# Patient Record
Sex: Female | Born: 1955 | Race: Black or African American | Hispanic: No | Marital: Single
Health system: Southern US, Academic
[De-identification: ages and names within clinical notes are randomized; demographics above are authoritative.]

## PROBLEM LIST (undated history)

## (undated) ENCOUNTER — Encounter

## (undated) ENCOUNTER — Encounter: Attending: Family | Primary: Family

## (undated) ENCOUNTER — Ambulatory Visit

## (undated) ENCOUNTER — Telehealth

## (undated) ENCOUNTER — Encounter: Payer: MEDICARE | Attending: Family | Primary: Family

## (undated) ENCOUNTER — Ambulatory Visit: Payer: MEDICARE | Attending: Family | Primary: Family

## (undated) ENCOUNTER — Ambulatory Visit: Payer: MEDICARE | Attending: Obstetrics & Gynecology | Primary: Obstetrics & Gynecology

## (undated) ENCOUNTER — Telehealth: Attending: Family | Primary: Family

## (undated) ENCOUNTER — Encounter
Attending: Pharmacist Clinician (PhC)/ Clinical Pharmacy Specialist | Primary: Pharmacist Clinician (PhC)/ Clinical Pharmacy Specialist

## (undated) ENCOUNTER — Inpatient Hospital Stay

## (undated) ENCOUNTER — Ambulatory Visit: Payer: MEDICARE

## (undated) ENCOUNTER — Ambulatory Visit: Attending: Emergency Medicine | Primary: Emergency Medicine

## (undated) ENCOUNTER — Encounter: Payer: MEDICARE | Attending: "Endocrinology | Primary: "Endocrinology

## (undated) ENCOUNTER — Ambulatory Visit
Payer: MEDICARE | Attending: Student in an Organized Health Care Education/Training Program | Primary: Student in an Organized Health Care Education/Training Program

## (undated) ENCOUNTER — Telehealth: Attending: Women's Health | Primary: Women's Health

## (undated) ENCOUNTER — Encounter: Attending: Gastroenterology | Primary: Gastroenterology

## (undated) ENCOUNTER — Ambulatory Visit: Attending: Family | Primary: Family

## (undated) ENCOUNTER — Ambulatory Visit: Payer: MEDICARE | Attending: Women's Health | Primary: Women's Health

## (undated) DIAGNOSIS — R4182 Altered mental status, unspecified: Secondary | ICD-10-CM

## (undated) DIAGNOSIS — F319 Bipolar disorder, unspecified: Secondary | ICD-10-CM

## (undated) DIAGNOSIS — J449 Chronic obstructive pulmonary disease, unspecified: Secondary | ICD-10-CM

## (undated) DIAGNOSIS — E785 Hyperlipidemia, unspecified: Secondary | ICD-10-CM

## (undated) DIAGNOSIS — N189 Chronic kidney disease, unspecified: Secondary | ICD-10-CM

## (undated) DIAGNOSIS — B1920 Unspecified viral hepatitis C without hepatic coma: Secondary | ICD-10-CM

## (undated) DIAGNOSIS — E875 Hyperkalemia: Secondary | ICD-10-CM

## (undated) DIAGNOSIS — I10 Essential (primary) hypertension: Secondary | ICD-10-CM

## (undated) DIAGNOSIS — E119 Type 2 diabetes mellitus without complications: Secondary | ICD-10-CM

## (undated) DIAGNOSIS — T887XXA Unspecified adverse effect of drug or medicament, initial encounter: Secondary | ICD-10-CM

## (undated) HISTORY — PX: OTHER SURGICAL HISTORY: SHX169

## (undated) MED ORDER — LACTULOSE 20 GRAM ORAL PACKET: Freq: Three times a day (TID) | ORAL | 0.00000 days

## (undated) MED ORDER — TIZANIDINE 4 MG TABLET: 0.00000 days

## (undated) MED ORDER — OLOPATADINE 0.1 % EYE DROPS: OPHTHALMIC | 0.00000 days

---
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
## 2013-03-01 ENCOUNTER — Emergency Department (HOSPITAL_COMMUNITY)
Admission: EM | Admit: 2013-03-01 | Discharge: 2013-03-01 | Disposition: A | Discharge status: Home / Self Care | Payer: Medicare Other | Attending: Emergency Medicine | Admitting: Emergency Medicine

## 2013-03-01 ENCOUNTER — Emergency Department (HOSPITAL_COMMUNITY): Payer: Medicare Other

## 2013-03-01 ENCOUNTER — Encounter (HOSPITAL_COMMUNITY): Payer: Self-pay | Admitting: Emergency Medicine

## 2013-03-01 DIAGNOSIS — R4789 Other speech disturbances: Secondary | ICD-10-CM | POA: Insufficient documentation

## 2013-03-01 DIAGNOSIS — F29 Unspecified psychosis not due to a substance or known physiological condition: Secondary | ICD-10-CM | POA: Insufficient documentation

## 2013-03-01 DIAGNOSIS — N189 Chronic kidney disease, unspecified: Secondary | ICD-10-CM | POA: Insufficient documentation

## 2013-03-01 DIAGNOSIS — J449 Chronic obstructive pulmonary disease, unspecified: Secondary | ICD-10-CM | POA: Insufficient documentation

## 2013-03-01 DIAGNOSIS — R410 Disorientation, unspecified: Secondary | ICD-10-CM

## 2013-03-01 DIAGNOSIS — J4489 Other specified chronic obstructive pulmonary disease: Secondary | ICD-10-CM | POA: Insufficient documentation

## 2013-03-01 DIAGNOSIS — E119 Type 2 diabetes mellitus without complications: Secondary | ICD-10-CM | POA: Insufficient documentation

## 2013-03-01 DIAGNOSIS — F319 Bipolar disorder, unspecified: Secondary | ICD-10-CM | POA: Insufficient documentation

## 2013-03-01 DIAGNOSIS — IMO0002 Reserved for concepts with insufficient information to code with codable children: Secondary | ICD-10-CM | POA: Insufficient documentation

## 2013-03-01 DIAGNOSIS — Z8619 Personal history of other infectious and parasitic diseases: Secondary | ICD-10-CM | POA: Insufficient documentation

## 2013-03-01 DIAGNOSIS — Z794 Long term (current) use of insulin: Secondary | ICD-10-CM | POA: Insufficient documentation

## 2013-03-01 DIAGNOSIS — Z79899 Other long term (current) drug therapy: Secondary | ICD-10-CM | POA: Insufficient documentation

## 2013-03-01 DIAGNOSIS — I129 Hypertensive chronic kidney disease with stage 1 through stage 4 chronic kidney disease, or unspecified chronic kidney disease: Secondary | ICD-10-CM | POA: Insufficient documentation

## 2013-03-01 HISTORY — DX: Type 2 diabetes mellitus without complications: E11.9

## 2013-03-01 HISTORY — DX: Altered mental status, unspecified: R41.82

## 2013-03-01 HISTORY — DX: Bipolar disorder, unspecified: F31.9

## 2013-03-01 HISTORY — DX: Hyperlipidemia, unspecified: E78.5

## 2013-03-01 HISTORY — DX: Unspecified viral hepatitis C without hepatic coma: B19.20

## 2013-03-01 HISTORY — DX: Chronic obstructive pulmonary disease, unspecified: J44.9

## 2013-03-01 HISTORY — DX: Essential (primary) hypertension: I10

## 2013-03-01 LAB — URINALYSIS, ROUTINE W REFLEX MICROSCOPIC
Bilirubin Urine: NEGATIVE
Glucose, UA: NEGATIVE mg/dL
Hgb urine dipstick: NEGATIVE
Ketones, ur: NEGATIVE mg/dL
Leukocytes, UA: NEGATIVE
Nitrite: NEGATIVE
Protein, ur: NEGATIVE mg/dL
SPECIFIC GRAVITY, URINE: 1.015 (ref 1.005–1.030)
UROBILINOGEN UA: 0.2 mg/dL (ref 0.0–1.0)
pH: 7 (ref 5.0–8.0)

## 2013-03-01 LAB — CBC WITH DIFFERENTIAL/PLATELET
BASOS ABS: 0.1 10*3/uL (ref 0.0–0.1)
Basophils Relative: 1 % (ref 0–1)
EOS ABS: 0.1 10*3/uL (ref 0.0–0.7)
Eosinophils Relative: 2 % (ref 0–5)
HCT: 33.5 % — ABNORMAL LOW (ref 36.0–46.0)
Hemoglobin: 11.1 g/dL — ABNORMAL LOW (ref 12.0–15.0)
LYMPHS ABS: 3 10*3/uL (ref 0.7–4.0)
Lymphocytes Relative: 46 % (ref 12–46)
MCH: 29.9 pg (ref 26.0–34.0)
MCHC: 33.1 g/dL (ref 30.0–36.0)
MCV: 90.3 fL (ref 78.0–100.0)
Monocytes Absolute: 1.1 10*3/uL — ABNORMAL HIGH (ref 0.1–1.0)
Monocytes Relative: 16 % — ABNORMAL HIGH (ref 3–12)
Neutro Abs: 2.3 10*3/uL (ref 1.7–7.7)
Neutrophils Relative %: 36 % — ABNORMAL LOW (ref 43–77)
PLATELETS: 156 10*3/uL (ref 150–400)
RBC: 3.71 MIL/uL — AB (ref 3.87–5.11)
RDW: 14.6 % (ref 11.5–15.5)
WBC: 6.6 10*3/uL (ref 4.0–10.5)

## 2013-03-01 LAB — PROTIME-INR
INR: 1.57 — ABNORMAL HIGH (ref 0.00–1.49)
PROTHROMBIN TIME: 18.3 s — AB (ref 11.6–15.2)

## 2013-03-01 LAB — COMPREHENSIVE METABOLIC PANEL
ALBUMIN: 2.8 g/dL — AB (ref 3.5–5.2)
ALT: 31 U/L (ref 0–35)
AST: 48 U/L — AB (ref 0–37)
Alkaline Phosphatase: 157 U/L — ABNORMAL HIGH (ref 39–117)
BUN: 12 mg/dL (ref 6–23)
CALCIUM: 9 mg/dL (ref 8.4–10.5)
CHLORIDE: 105 meq/L (ref 96–112)
CO2: 26 mEq/L (ref 19–32)
CREATININE: 1.19 mg/dL — AB (ref 0.50–1.10)
GFR calc Af Amer: 57 mL/min — ABNORMAL LOW (ref >90)
GFR calc non Af Amer: 49 mL/min — ABNORMAL LOW (ref >90)
Glucose, Bld: 107 mg/dL — ABNORMAL HIGH (ref 70–99)
Potassium: 4.1 mEq/L (ref 3.7–5.3)
Sodium: 140 mEq/L (ref 137–147)
TOTAL PROTEIN: 8.1 g/dL (ref 6.0–8.3)
Total Bilirubin: 0.9 mg/dL (ref 0.3–1.2)

## 2013-03-01 LAB — GLUCOSE, CAPILLARY: GLUCOSE-CAPILLARY: 105 mg/dL — AB (ref 70–99)

## 2013-03-01 LAB — AMMONIA: AMMONIA: 36 umol/L (ref 11–60)

## 2013-03-01 LAB — PRO B NATRIURETIC PEPTIDE: Pro B Natriuretic peptide (BNP): 92.3 pg/mL (ref 0–125)

## 2013-03-01 LAB — LACTIC ACID, PLASMA: Lactic Acid, Venous: 0.8 mmol/L (ref 0.5–2.2)

## 2013-03-01 MED ORDER — SODIUM CHLORIDE 0.9 % IV SOLN
1000.0000 mL | INTRAVENOUS | Status: DC
Start: 2013-03-01 — End: 2013-03-02

## 2013-03-01 MED ORDER — SODIUM CHLORIDE 0.9 % IV SOLN
1000.0000 mL | Freq: Once | INTRAVENOUS | Status: AC
  Administered 2013-03-01: 1000 mL via INTRAVENOUS

## 2013-03-01 NOTE — ED Notes (Signed)
[facility] called and given reports. Staff states they will send transportation.

## 2013-03-01 NOTE — ED Notes (Signed)
Pt comes [facility] via [facility] after staff reports increased altered mental status and increased falls x1 week. Staff also states "face and legs are more swollen than usual".

## 2013-03-01 NOTE — ED Notes (Signed)
[facility] and requested to speak with ER physician about pt's condition. Dr. Deretha EmoryZackowski spoke with the PA and re-assessed pt and agreed with Dr. Priscille LovelessLockwood's disposition to discharge the pt.

## 2013-03-01 NOTE — ED Provider Notes (Signed)
CSN: [id number]     Arrival date & time 03/01/13  1256 History   First MD Initiated Contact with Patient 03/01/13 1315     Chief Complaint  Patient presents with  . Altered Mental Status    HPI  Patient presents for a nursing facility with staff concerns of increasing confusion, frequency of falls. History of present illness is per the patient and her sister.  One end of it this concern that developed over some time, are not acutely new. The patient herself denies pain. She states that she feels hungry, but generally unwell. She denies new confusion, disorientation. She does endorse a history of multiple medical problems, including hepatitis C, kidney disease, COPD. She notes no new precipitating factors, no alleviating or exacerbating factors either.   Past Medical History  Diagnosis Date  . Altered mental status   . Hypertension   . Hyperlipidemia   . Diabetes mellitus without complication   . COPD (chronic obstructive pulmonary disease)   . Renal disorder     chronic kidney failure  . Hepatitis C   . Bipolar disorder    Past Surgical History  Procedure Laterality Date  . Unable     No family history on file. History  Substance Use Topics  . Smoking status: Unknown If Ever Smoked  . Smokeless tobacco: Not on file  . Alcohol Use: No   OB History   Grav Para Term Preterm Abortions TAB SAB Ect Mult Living                 Review of Systems  Constitutional:       Per HPI, otherwise negative  HENT:       Per HPI, otherwise negative  Respiratory:       Per HPI, otherwise negative  Cardiovascular:       Per HPI, otherwise negative  Gastrointestinal: Negative for vomiting.  Endocrine:       Negative aside from HPI  Genitourinary:       Neg aside from HPI   Musculoskeletal:       Per HPI, otherwise negative  Skin: Negative.   Neurological: Negative for syncope.    Allergies  Codeine  Home Medications   Current Outpatient Rx  Name  Route  Sig  Dispense   Refill  . acetaminophen (TYLENOL) 500 MG tablet   Oral   Take 500 mg by mouth every 4 (four) hours as needed for mild pain or fever.         Marland Kitchen albuterol (PROVENTIL) (2.5 MG/3ML) 0.083% nebulizer solution   Nebulization   Take 2.5 mg by nebulization every 6 (six) hours as needed for wheezing or shortness of breath.         Marland Kitchen alum & mag hydroxide-simeth (MAALOX/MYLANTA) 200-200-20 MG/5ML suspension   Oral   Take 30 mLs by mouth every 6 (six) hours as needed for indigestion or heartburn.         Marland Kitchen amLODipine (NORVASC) 10 MG tablet   Oral   Take 10 mg by mouth daily.         Marland Kitchen diltiazem (DILACOR XR) 180 MG 24 hr capsule   Oral   Take 180 mg by mouth daily.         . Fluticasone-Salmeterol (ADVAIR) 100-50 MCG/DOSE AEPB   Inhalation   Inhale 1 puff into the lungs 2 (two) times daily.         Marland Kitchen gabapentin (NEURONTIN) 100 MG capsule   Oral   Take  100 mg by mouth 3 (three) times daily.         Marland Kitchen glipiZIDE (GLUCOTROL XL) 10 MG 24 hr tablet   Oral   Take 10 mg by mouth daily with breakfast.         . hydrochlorothiazide (HYDRODIURIL) 25 MG tablet   Oral   Take 25 mg by mouth daily.         . hydrOXYzine (ATARAX/VISTARIL) 25 MG tablet   Oral   Take 25 mg by mouth every 6 (six) hours as needed for itching.         . insulin detemir (LEVEMIR) 100 UNIT/ML injection   Subcutaneous   Inject 40 Units into the skin at bedtime.         . insulin lispro (HUMALOG) 100 UNIT/ML injection   Subcutaneous   Inject 18 Units into the skin 3 (three) times daily. Give right after meals.  Hold if patient eats less than 1/2 of meal and if FSBS is less than 100.         . lactulose (CHRONULAC) 10 GM/15ML solution   Oral   Take 20 g by mouth 2 (two) times daily.         Marland Kitchen lisinopril (PRINIVIL,ZESTRIL) 40 MG tablet   Oral   Take 40 mg by mouth daily.         Marland Kitchen loperamide (IMODIUM) 2 MG capsule   Oral   Take 2 mg by mouth as needed for diarrhea or loose stools.          . magnesium hydroxide (MILK OF MAGNESIA) 400 MG/5ML suspension   Oral   Take 30 mLs by mouth at bedtime as needed for mild constipation.         . methadone (DOLOPHINE) 5 MG tablet   Oral   Take 2.5 mg by mouth every 12 (twelve) hours.         Marland Kitchen oxyCODONE-acetaminophen (PERCOCET/ROXICET) 5-325 MG per tablet   Oral   Take 1 tablet by mouth every 4 (four) hours as needed for severe pain.         . potassium chloride SA (K-DUR,KLOR-CON) 20 MEQ tablet   Oral   Take 20 mEq by mouth 2 (two) times daily.         . pregabalin (LYRICA) 100 MG capsule   Oral   Take 100 mg by mouth 2 (two) times daily.         . pregabalin (LYRICA) 150 MG capsule   Oral   Take 150 mg by mouth at bedtime.         . psyllium (METAMUCIL) 58.6 % powder   Oral   Take 1 packet by mouth daily.         . QUEtiapine Fumarate (SEROQUEL XR) 150 MG 24 hr tablet   Oral   Take 300 mg by mouth at bedtime.         . senna (SENOKOT) 8.6 MG TABS tablet   Oral   Take 2 tablets by mouth daily.         . Skin Protectants, Misc. (EUCERIN) cream   Topical   Apply 1 application topically 2 (two) times daily as needed for dry skin.         Marland Kitchen traMADol (ULTRAM) 50 MG tablet   Oral   Take 50 mg by mouth every 6 (six) hours as needed for moderate pain.         . Vitamin D, Ergocalciferol, (DRISDOL) 50000 UNITS  CAPS capsule   Oral   Take 50,000 Units by mouth every 7 (seven) days.          BP 122/77  Pulse 100  Temp(Src) 98.8 F (37.1 C) (Oral)  Resp 18  Ht 5\' 7"  (1.702 m)  Wt 200 lb (90.719 kg)  BMI 31.32 kg/m2  SpO2 93% Physical Exam  Nursing note and vitals reviewed. Constitutional: She is oriented to person, place, and time. She appears well-developed and well-nourished. No distress.  HENT:  Head: Normocephalic and atraumatic.  Eyes: Conjunctivae and EOM are normal.  Cardiovascular: Normal rate and regular rhythm.   Pulmonary/Chest: Effort normal and breath sounds normal. No  stridor. No respiratory distress.  Abdominal: She exhibits no distension. There is no tenderness.  Musculoskeletal: She exhibits no edema.  L distal UE in cast - patient moves digits appropriately.  Neurological: She is alert and oriented to person, place, and time. No cranial nerve deficit.  No asterixis  Skin: Skin is warm and dry.  Psychiatric: She has a normal mood and affect. Her speech is delayed. She is slowed and withdrawn. Thought content is not paranoid and not delusional. Cognition and memory are impaired.    ED Course  Procedures (including critical care time) Labs Review Labs Reviewed  GLUCOSE, CAPILLARY - Abnormal; Notable for the following:    Glucose-Capillary 105 (*)    All other components within normal limits  COMPREHENSIVE METABOLIC PANEL - Abnormal; Notable for the following:    Glucose, Bld 107 (*)    Creatinine, Ser 1.19 (*)    Albumin 2.8 (*)    AST 48 (*)    Alkaline Phosphatase 157 (*)    GFR calc non Af Amer 49 (*)    GFR calc Af Amer 57 (*)    All other components within normal limits  PROTIME-INR - Abnormal; Notable for the following:    Prothrombin Time 18.3 (*)    INR 1.57 (*)    All other components within normal limits  CBC WITH DIFFERENTIAL - Abnormal; Notable for the following:    RBC 3.71 (*)    Hemoglobin 11.1 (*)    HCT 33.5 (*)    Neutrophils Relative % 36 (*)    Monocytes Relative 16 (*)    Monocytes Absolute 1.1 (*)    All other components within normal limits  LACTIC ACID, PLASMA  AMMONIA  URINALYSIS, ROUTINE W REFLEX MICROSCOPIC   Imaging Review Dg Chest 2 View  03/01/2013   CLINICAL DATA:  Altered mental status.  EXAM: CHEST  2 VIEW  COMPARISON:  None.  FINDINGS: The lungs are clear. Heart size is upper normal. No pneumothorax or pleural effusion. No focal bony abnormality.  IMPRESSION: No acute disease.   Electronically Signed   By: Drusilla Kannerhomas  Dalessio M.D.   On: 03/01/2013 14:49   Ct Head Wo Contrast  03/01/2013   CLINICAL  DATA:  Altered mental status. Increasing falls over the past week.  EXAM: CT HEAD WITHOUT CONTRAST  TECHNIQUE: Contiguous axial images were obtained from the base of the skull through the vertex without intravenous contrast.  COMPARISON:  No priors.  FINDINGS: Faint physiologic calcifications in the basal ganglia bilaterally. No acute intracranial abnormalities. Specifically, no evidence of acute intracranial hemorrhage, no definite findings of acute/subacute cerebral ischemia, no mass, mass effect, hydrocephalus or abnormal intra or extra-axial fluid collections. Visualized paranasal sinuses and mastoids are well pneumatized. No acute displaced skull fractures are identified.  IMPRESSION: 1. No acute intracranial abnormalities. 2. The  appearance of the brain is normal.   Electronically Signed   By: Trudie Reed M.D.   On: 03/01/2013 14:31    EKG Interpretation    Date/Time:  Wednesday March 01 2013 13:31:41 EST Ventricular Rate:  93 PR Interval:  162 QRS Duration: 82 QT Interval:  372 QTC Calculation: 462 R Axis:   51 Text Interpretation:  Normal sinus rhythm Normal ECG No previous ECGs available Sinus rhythm Normal ECG Confirmed by Gerhard Munch  MD ([id number]) on 03/01/2013 1:51:34 PM           3:15 PM Patient awake, alert, sitting upright and eating lunch - NAD MDM   Patient presents with concern for increasing confusion, frequency of falls.  On exam, and throughout the patient's emergency department course she was hemodynamically stable, and in no distress.  She was appropriately interactive, eating lunch and in no distress. With her multiple medical problems, including hepatitis C, COPD, there is some suspicion for comorbidities contributing to progression of overall condition, possibly exacerbated by medication. With no acute findings was appropriate for discharge with return to monitored facility.    Gerhard Munch, MD 03/01/13 [phone]

## 2013-03-01 NOTE — ED Provider Notes (Signed)
Patient seen by me here at the time of discharge agree with Dr. Priscille LovelessLockwood's assessment the patient is stable for return back to the [facility]. Patient has a slight degree of confusion but her sister states that that is pretty normal for her patients able to eat she's holding conversations. Patient claims that she has a walker to help her walk. Head CT here was negative. No significant elevation in the ammonia level.    Shelda JakesScott W. Gerrod Maule, MD 03/01/13 1754

## 2013-03-01 NOTE — Discharge Instructions (Signed)
As discussed, your evaluation was largely reassuring.  Please be sure to follow-up with your physician.

## 2013-03-01 NOTE — ED Notes (Signed)
Pt transported back to [facility] via [facility]

## 2013-03-01 NOTE — ED Notes (Signed)
EMS [facility] due to increased confusion, recent falls (last fall was last night), and legs and face appeared more swollen than usual. NAD on arrival.

## 2013-08-29 ENCOUNTER — Emergency Department (HOSPITAL_COMMUNITY): Payer: Medicare Other

## 2013-08-29 ENCOUNTER — Encounter (HOSPITAL_COMMUNITY): Payer: Self-pay | Admitting: Emergency Medicine

## 2013-08-29 ENCOUNTER — Emergency Department (HOSPITAL_COMMUNITY)
Admission: EM | Admit: 2013-08-29 | Discharge: 2013-08-30 | Disposition: A | Discharge status: Home / Self Care | Payer: Medicare Other | Attending: Emergency Medicine | Admitting: Emergency Medicine

## 2013-08-29 DIAGNOSIS — E119 Type 2 diabetes mellitus without complications: Secondary | ICD-10-CM | POA: Diagnosis not present

## 2013-08-29 DIAGNOSIS — R7402 Elevation of levels of lactic acid dehydrogenase (LDH): Secondary | ICD-10-CM | POA: Diagnosis not present

## 2013-08-29 DIAGNOSIS — IMO0002 Reserved for concepts with insufficient information to code with codable children: Secondary | ICD-10-CM | POA: Insufficient documentation

## 2013-08-29 DIAGNOSIS — N189 Chronic kidney disease, unspecified: Secondary | ICD-10-CM | POA: Diagnosis not present

## 2013-08-29 DIAGNOSIS — R109 Unspecified abdominal pain: Secondary | ICD-10-CM | POA: Insufficient documentation

## 2013-08-29 DIAGNOSIS — F319 Bipolar disorder, unspecified: Secondary | ICD-10-CM | POA: Diagnosis not present

## 2013-08-29 DIAGNOSIS — Z79899 Other long term (current) drug therapy: Secondary | ICD-10-CM | POA: Insufficient documentation

## 2013-08-29 DIAGNOSIS — I129 Hypertensive chronic kidney disease with stage 1 through stage 4 chronic kidney disease, or unspecified chronic kidney disease: Secondary | ICD-10-CM | POA: Insufficient documentation

## 2013-08-29 DIAGNOSIS — Z794 Long term (current) use of insulin: Secondary | ICD-10-CM | POA: Diagnosis not present

## 2013-08-29 DIAGNOSIS — J449 Chronic obstructive pulmonary disease, unspecified: Secondary | ICD-10-CM | POA: Insufficient documentation

## 2013-08-29 DIAGNOSIS — R4182 Altered mental status, unspecified: Secondary | ICD-10-CM | POA: Diagnosis present

## 2013-08-29 DIAGNOSIS — Z8619 Personal history of other infectious and parasitic diseases: Secondary | ICD-10-CM | POA: Insufficient documentation

## 2013-08-29 DIAGNOSIS — R079 Chest pain, unspecified: Secondary | ICD-10-CM | POA: Diagnosis not present

## 2013-08-29 DIAGNOSIS — R7401 Elevation of levels of liver transaminase levels: Secondary | ICD-10-CM | POA: Insufficient documentation

## 2013-08-29 DIAGNOSIS — R740 Nonspecific elevation of levels of transaminase and lactic acid dehydrogenase [LDH]: Secondary | ICD-10-CM

## 2013-08-29 DIAGNOSIS — J4489 Other specified chronic obstructive pulmonary disease: Secondary | ICD-10-CM | POA: Insufficient documentation

## 2013-08-29 LAB — CBC WITH DIFFERENTIAL/PLATELET
Basophils Absolute: 0 10*3/uL (ref 0.0–0.1)
Basophils Relative: 1 % (ref 0–1)
EOS PCT: 2 % (ref 0–5)
Eosinophils Absolute: 0.1 10*3/uL (ref 0.0–0.7)
HCT: 35.1 % — ABNORMAL LOW (ref 36.0–46.0)
HEMOGLOBIN: 11.6 g/dL — AB (ref 12.0–15.0)
LYMPHS ABS: 2 10*3/uL (ref 0.7–4.0)
LYMPHS PCT: 35 % (ref 12–46)
MCH: 30.4 pg (ref 26.0–34.0)
MCHC: 33 g/dL (ref 30.0–36.0)
MCV: 92.1 fL (ref 78.0–100.0)
MONOS PCT: 16 % — AB (ref 3–12)
Monocytes Absolute: 0.9 10*3/uL (ref 0.1–1.0)
Neutro Abs: 2.7 10*3/uL (ref 1.7–7.7)
Neutrophils Relative %: 46 % (ref 43–77)
Platelets: 123 10*3/uL — ABNORMAL LOW (ref 150–400)
RBC: 3.81 MIL/uL — AB (ref 3.87–5.11)
RDW: 15.7 % — ABNORMAL HIGH (ref 11.5–15.5)
WBC: 5.6 10*3/uL (ref 4.0–10.5)

## 2013-08-29 LAB — COMPREHENSIVE METABOLIC PANEL
ALT: 37 U/L — AB (ref 0–35)
AST: 52 U/L — ABNORMAL HIGH (ref 0–37)
Albumin: 2.7 g/dL — ABNORMAL LOW (ref 3.5–5.2)
Alkaline Phosphatase: 194 U/L — ABNORMAL HIGH (ref 39–117)
Anion gap: 7 (ref 5–15)
BUN: 11 mg/dL (ref 6–23)
CALCIUM: 8.6 mg/dL (ref 8.4–10.5)
CO2: 27 meq/L (ref 19–32)
Chloride: 101 mEq/L (ref 96–112)
Creatinine, Ser: 1.2 mg/dL — ABNORMAL HIGH (ref 0.50–1.10)
GFR calc Af Amer: 56 mL/min — ABNORMAL LOW (ref >90)
GFR, EST NON AFRICAN AMERICAN: 48 mL/min — AB (ref >90)
GLUCOSE: 136 mg/dL — AB (ref 70–99)
Potassium: 4.6 mEq/L (ref 3.7–5.3)
SODIUM: 135 meq/L — AB (ref 137–147)
Total Bilirubin: 0.8 mg/dL (ref 0.3–1.2)
Total Protein: 8 g/dL (ref 6.0–8.3)

## 2013-08-29 LAB — I-STAT CG4 LACTIC ACID, ED: Lactic Acid, Venous: 1.31 mmol/L (ref 0.5–2.2)

## 2013-08-29 LAB — LIPASE, BLOOD: Lipase: 48 U/L (ref 11–59)

## 2013-08-29 LAB — PROTIME-INR
INR: 1.77 — ABNORMAL HIGH (ref 0.00–1.49)
Prothrombin Time: 20.6 seconds — ABNORMAL HIGH (ref 11.6–15.2)

## 2013-08-29 LAB — TROPONIN I: Troponin I: <0.3 ng/mL (ref <0.30)

## 2013-08-29 LAB — AMMONIA: Ammonia: 50 umol/L (ref 11–60)

## 2013-08-29 MED ORDER — IOHEXOL 300 MG/ML  SOLN
50.0000 mL | Freq: Once | INTRAMUSCULAR | Status: AC | PRN
  Administered 2013-08-29: 50 mL via ORAL

## 2013-08-29 NOTE — ED Notes (Signed)
[facility] is [facility] noted to be confused for last two days, first b/s-69, give 15 grams of glucose, 2nd b/s-77. Pt reports no pain.

## 2013-08-29 NOTE — ED Notes (Signed)
V/S- B/P-130/85 P-80 R-20 O2-Sats-98%.

## 2013-08-29 NOTE — ED Provider Notes (Signed)
CSN: [id number]     Arrival date & time 08/29/13  2127 History   First MD Initiated Contact with Patient 08/29/13 2133     Chief Complaint  Patient presents with  . Altered Mental Status   assisted living initially reported patient having altered mental status but then changed her complaints to possible chest pain possible abdominal pain   (Consider location/radiation/quality/duration/timing/severity/associated sxs/prior Treatment) HPI 58 year old female lives at an assisted living facility has history of diabetes, hepatitis C, chronic kidney disease, initially apparently sent to the emergency department for confusion but when I called the nurse at the assisted living facility she states the patient was not confused but was possibly having chest pain and/or abdominal pain the last couple days, the patient herself initially denied any complaints whatsoever and was oriented to person place and time, but then stated she would answer yes to any questions and stated she had every complaint possible but that they have always been present 24 hours a day for several months, including when asked, a combination of fever headache neck pain back pain rash chest pain abdominal pain cough shortness of breath weakness numbness and as I asked any other questions she would say yes to all of them. The assisted living facility has been trying to get the patient to have a hepatologist consult since May of this year so they thought sending her to the ED tonight could get that accomplished tonight. The patient appears to be an unreliable historian. The assisted-living facility staff states the patient has not really seemed confused and seems at her baseline mental status. Past Medical History  Diagnosis Date  . Altered mental status   . Hypertension   . Hyperlipidemia   . Diabetes mellitus without complication   . COPD (chronic obstructive pulmonary disease)   . Renal disorder     chronic kidney failure  . Hepatitis  C   . Bipolar disorder    Past Surgical History  Procedure Laterality Date  . Unable     No family history on file. History  Substance Use Topics  . Smoking status: Unknown If Ever Smoked  . Smokeless tobacco: Not on file  . Alcohol Use: No   OB History   Grav Para Term Preterm Abortions TAB SAB Ect Mult Living                 Review of Systems  Unable to perform ROS: Mental status change      Allergies  Codeine  Home Medications   Prior to Admission medications   Medication Sig Start Date End Date Taking? Authorizing Provider  amLODipine (NORVASC) 10 MG tablet Take 10 mg by mouth daily.   Yes Historical Provider, MD  diltiazem (DILACOR XR) 180 MG 24 hr capsule Take 180 mg by mouth daily.   Yes Historical Provider, MD  Fluticasone-Salmeterol (ADVAIR) 100-50 MCG/DOSE AEPB Inhale 1 puff into the lungs 2 (two) times daily.   Yes Historical Provider, MD  gabapentin (NEURONTIN) 300 MG capsule Take 300 mg by mouth 3 (three) times daily.   Yes Historical Provider, MD  hydrochlorothiazide (HYDRODIURIL) 25 MG tablet Take 25 mg by mouth daily.   Yes Historical Provider, MD  hydrOXYzine (ATARAX/VISTARIL) 25 MG tablet Take 25 mg by mouth every 6 (six) hours as needed for itching.   Yes Historical Provider, MD  insulin detemir (LEVEMIR) 100 UNIT/ML injection Inject 40 Units into the skin at bedtime.   Yes Historical Provider, MD  insulin lispro (HUMALOG) 100 UNIT/ML injection  Inject 27 Units into the skin 3 (three) times daily. Give right after meals.  Hold if patient eats less than 1/2 of meal and if FSBS is less than 100.   Yes Historical Provider, MD  lactulose (CHRONULAC) 10 GM/15ML solution Take 20 g by mouth 4 (four) times daily.    Yes Historical Provider, MD  lisinopril (PRINIVIL,ZESTRIL) 40 MG tablet Take 40 mg by mouth daily.   Yes Historical Provider, MD  oxyCODONE-acetaminophen (PERCOCET/ROXICET) 5-325 MG per tablet Take 1 tablet by mouth every 4 (four) hours as needed for  severe pain.   Yes Historical Provider, MD  potassium chloride SA (K-DUR,KLOR-CON) 20 MEQ tablet Take 20 mEq by mouth 2 (two) times daily.   Yes Historical Provider, MD  pregabalin (LYRICA) 100 MG capsule Take 100 mg by mouth 2 (two) times daily.   Yes Historical Provider, MD  pregabalin (LYRICA) 150 MG capsule Take 150 mg by mouth at bedtime.   Yes Historical Provider, MD  Psyllium (REGULOID) 48.57 % POWD Take 5 mLs by mouth every morning. 1 teaspoonful in 8 ounces of water every morning   Yes Historical Provider, MD  QUEtiapine Fumarate (SEROQUEL XR) 150 MG 24 hr tablet Take 300 mg by mouth at bedtime.   Yes Historical Provider, MD  senna (SENOKOT) 8.6 MG TABS tablet Take 2 tablets by mouth daily.   Yes Historical Provider, MD  traMADol (ULTRAM) 50 MG tablet Take 50 mg by mouth every 6 (six) hours as needed for moderate pain.   Yes Historical Provider, MD  Vitamin D, Ergocalciferol, (DRISDOL) 50000 UNITS CAPS capsule Take 50,000 Units by mouth every 7 (seven) days.   Yes Historical Provider, MD  acetaminophen (TYLENOL) 500 MG tablet Take 500 mg by mouth every 4 (four) hours as needed for mild pain or fever.    Historical Provider, MD  albuterol (PROVENTIL) (2.5 MG/3ML) 0.083% nebulizer solution Take 2.5 mg by nebulization every 6 (six) hours as needed for wheezing or shortness of breath.    Historical Provider, MD  alum & mag hydroxide-simeth (MAALOX/MYLANTA) 200-200-20 MG/5ML suspension Take 30 mLs by mouth every 6 (six) hours as needed for indigestion or heartburn.    Historical Provider, MD  loperamide (IMODIUM) 2 MG capsule Take 2 mg by mouth as needed for diarrhea or loose stools.    Historical Provider, MD  magnesium hydroxide (MILK OF MAGNESIA) 400 MG/5ML suspension Take 30 mLs by mouth at bedtime as needed for mild constipation.    Historical Provider, MD  Skin Protectants, Misc. (EUCERIN) cream Apply 1 application topically 2 (two) times daily as needed for dry skin.    Historical Provider,  MD   BP 100/59  Pulse 79  Temp(Src) 97.7 F (36.5 C) (Rectal)  Resp 18  Ht 5\' 7"  (1.702 m)  Wt 240 lb (108.863 kg)  BMI 37.58 kg/m2  SpO2 94% Physical Exam  Nursing note and vitals reviewed. Constitutional: She is oriented to person, place, and time.  Awake, alert, nontoxic appearance. Oriented to person place and time.  HENT:  Head: Atraumatic.  Eyes: EOM are normal. Right eye exhibits no discharge. Left eye exhibits no discharge.  Peripheral visual fields full to confrontation  Neck: Neck supple.  Cardiovascular: Regular rhythm.   No murmur heard. Tachycardic  Pulmonary/Chest: Effort normal and breath sounds normal. No respiratory distress. She has no wheezes. She has no rales. She exhibits no tenderness.  Pulse oximetry normal room air 96%  Abdominal: Soft. Bowel sounds are normal. She exhibits no distension  and no mass. There is no tenderness. There is no rebound and no guarding.  Musculoskeletal: She exhibits edema. She exhibits no tenderness.  Baseline ROM, no obvious new focal weakness. Moderate edema bilateral lower legs nontender. Arms and legs currently nontender.  Neurological: She is alert and oriented to person, place, and time.  Mental status and motor strength appears baseline for patient oriented to person place month and day of week. No facial asymmetry noted, tongue movements normal, normal light touch to face arms and legs, motor strength no bradycardia drift in arms but unable to lift either leg off the bed with 4/5 strength in both arms and both legs with normal bilateral finger to nose testing; gait not initially tested  Skin: No rash noted.  Psychiatric: She has a normal mood and affect.    ED Course  Procedures (including critical care time) Patient / Family / Caregiver understand and agree with initial ED impression and plan with expectations set for ED visit. Labs Review Labs Reviewed  CBC WITH DIFFERENTIAL - Abnormal; Notable for the following:     RBC 3.81 (*)    Hemoglobin 11.6 (*)    HCT 35.1 (*)    RDW 15.7 (*)    Platelets 123 (*)    Monocytes Relative 16 (*)    All other components within normal limits  COMPREHENSIVE METABOLIC PANEL - Abnormal; Notable for the following:    Sodium 135 (*)    Glucose, Bld 136 (*)    Creatinine, Ser 1.20 (*)    Albumin 2.7 (*)    AST 52 (*)    ALT 37 (*)    Alkaline Phosphatase 194 (*)    GFR calc non Af Amer 48 (*)    GFR calc Af Amer 56 (*)    All other components within normal limits  PROTIME-INR - Abnormal; Notable for the following:    Prothrombin Time 20.6 (*)    INR 1.77 (*)    All other components within normal limits  CULTURE, BLOOD (ROUTINE X 2)  CULTURE, BLOOD (ROUTINE X 2)  URINE CULTURE  AMMONIA  TROPONIN I  LIPASE, BLOOD  I-STAT CG4 LACTIC ACID, ED    Imaging Review No results found.   EKG Interpretation   Date/Time:  Tuesday August 29 2013 22:08:54 EDT Ventricular Rate:  79 PR Interval:  168 QRS Duration: 81 QT Interval:  387 QTC Calculation: 444 R Axis:   71 Text Interpretation:  Sinus rhythm Normal ECG No significant change since  last tracing Confirmed by [facility]  MD, Jonny Ruiz ([id number]) on 08/29/2013 10:19:25  PM      MDM   Final diagnoses:  Chest pain, unspecified chest pain type  Abdominal pain, unspecified abdominal location  Transaminitis    I doubt any other EMC precluding discharge at this time including, but not necessarily limited to the following:sepsis, CVA, AMI, peritonitis.    Hurman Horn, MD 09/02/13 [phone]

## 2013-08-30 MED ORDER — IOHEXOL 300 MG/ML  SOLN: 100.0000 mL | Freq: Once | INTRAMUSCULAR | Status: DC | PRN

## 2013-08-30 NOTE — ED Notes (Signed)
Phoned [facility] and there was no answer to give report and to ask for transport back to faclility

## 2013-08-30 NOTE — Discharge Instructions (Signed)
Please call your doctor for a followup appointment within 24-48 hours. When you talk to your doctor please let them know that you were seen in the emergency department and have them acquire all of your records so that they can discuss the findings with you and formulate a treatment plan to fully care for your new and ongoing problems.  Your testing did not show any new or acute findings to cause any pain or confusion.

## 2013-09-04 LAB — CULTURE, BLOOD (ROUTINE X 2)
CULTURE: NO GROWTH
CULTURE: NO GROWTH

## 2014-02-23 ENCOUNTER — Encounter (HOSPITAL_COMMUNITY): Payer: Self-pay | Admitting: *Deleted

## 2014-02-23 ENCOUNTER — Emergency Department (HOSPITAL_COMMUNITY): Payer: Medicare Other

## 2014-02-23 ENCOUNTER — Emergency Department (HOSPITAL_COMMUNITY)
Admission: EM | Admit: 2014-02-23 | Discharge: 2014-02-23 | Disposition: A | Discharge status: Home / Self Care | Payer: Medicare Other | Attending: Emergency Medicine | Admitting: Emergency Medicine

## 2014-02-23 DIAGNOSIS — R079 Chest pain, unspecified: Secondary | ICD-10-CM | POA: Diagnosis not present

## 2014-02-23 DIAGNOSIS — Z79899 Other long term (current) drug therapy: Secondary | ICD-10-CM | POA: Diagnosis not present

## 2014-02-23 DIAGNOSIS — Z794 Long term (current) use of insulin: Secondary | ICD-10-CM | POA: Insufficient documentation

## 2014-02-23 DIAGNOSIS — Z8619 Personal history of other infectious and parasitic diseases: Secondary | ICD-10-CM | POA: Diagnosis not present

## 2014-02-23 DIAGNOSIS — N189 Chronic kidney disease, unspecified: Secondary | ICD-10-CM | POA: Insufficient documentation

## 2014-02-23 DIAGNOSIS — M791 Myalgia: Secondary | ICD-10-CM | POA: Diagnosis not present

## 2014-02-23 DIAGNOSIS — F319 Bipolar disorder, unspecified: Secondary | ICD-10-CM | POA: Diagnosis not present

## 2014-02-23 DIAGNOSIS — Z7951 Long term (current) use of inhaled steroids: Secondary | ICD-10-CM | POA: Insufficient documentation

## 2014-02-23 DIAGNOSIS — R0602 Shortness of breath: Secondary | ICD-10-CM | POA: Diagnosis present

## 2014-02-23 DIAGNOSIS — E119 Type 2 diabetes mellitus without complications: Secondary | ICD-10-CM | POA: Diagnosis not present

## 2014-02-23 DIAGNOSIS — Z7952 Long term (current) use of systemic steroids: Secondary | ICD-10-CM | POA: Diagnosis not present

## 2014-02-23 DIAGNOSIS — I129 Hypertensive chronic kidney disease with stage 1 through stage 4 chronic kidney disease, or unspecified chronic kidney disease: Secondary | ICD-10-CM | POA: Diagnosis not present

## 2014-02-23 DIAGNOSIS — J441 Chronic obstructive pulmonary disease with (acute) exacerbation: Secondary | ICD-10-CM | POA: Diagnosis not present

## 2014-02-23 DIAGNOSIS — Z792 Long term (current) use of antibiotics: Secondary | ICD-10-CM | POA: Diagnosis not present

## 2014-02-23 DIAGNOSIS — R51 Headache: Secondary | ICD-10-CM | POA: Diagnosis not present

## 2014-02-23 LAB — CBC WITH DIFFERENTIAL/PLATELET
Basophils Absolute: 0.1 10*3/uL (ref 0.0–0.1)
Basophils Relative: 1 % (ref 0–1)
EOS ABS: 0 10*3/uL (ref 0.0–0.7)
Eosinophils Relative: 0 % (ref 0–5)
HEMATOCRIT: 33.2 % — AB (ref 36.0–46.0)
Hemoglobin: 10.9 g/dL — ABNORMAL LOW (ref 12.0–15.0)
Lymphocytes Relative: 16 % (ref 12–46)
Lymphs Abs: 1.1 10*3/uL (ref 0.7–4.0)
MCH: 29.9 pg (ref 26.0–34.0)
MCHC: 32.8 g/dL (ref 30.0–36.0)
MCV: 91.2 fL (ref 78.0–100.0)
Monocytes Absolute: 0.7 10*3/uL (ref 0.1–1.0)
Monocytes Relative: 9 % (ref 3–12)
NEUTROS ABS: 5.1 10*3/uL (ref 1.7–7.7)
NEUTROS PCT: 74 % (ref 43–77)
Platelets: 140 10*3/uL — ABNORMAL LOW (ref 150–400)
RBC: 3.64 MIL/uL — AB (ref 3.87–5.11)
RDW: 15.4 % (ref 11.5–15.5)
WBC: 6.9 10*3/uL (ref 4.0–10.5)

## 2014-02-23 LAB — COMPREHENSIVE METABOLIC PANEL
ALT: 70 U/L — AB (ref 0–35)
AST: 83 U/L — ABNORMAL HIGH (ref 0–37)
Albumin: 3.2 g/dL — ABNORMAL LOW (ref 3.5–5.2)
Alkaline Phosphatase: 157 U/L — ABNORMAL HIGH (ref 39–117)
Anion gap: 4 — ABNORMAL LOW (ref 5–15)
BUN: 15 mg/dL (ref 6–23)
CHLORIDE: 106 meq/L (ref 96–112)
CO2: 27 mmol/L (ref 19–32)
CREATININE: 1.1 mg/dL (ref 0.50–1.10)
Calcium: 8.6 mg/dL (ref 8.4–10.5)
GFR calc Af Amer: 62 mL/min — ABNORMAL LOW (ref >90)
GFR calc non Af Amer: 53 mL/min — ABNORMAL LOW (ref >90)
GLUCOSE: 167 mg/dL — AB (ref 70–99)
POTASSIUM: 4.3 mmol/L (ref 3.5–5.1)
Sodium: 137 mmol/L (ref 135–145)
TOTAL PROTEIN: 7.7 g/dL (ref 6.0–8.3)
Total Bilirubin: 0.5 mg/dL (ref 0.3–1.2)

## 2014-02-23 MED ORDER — METHYLPREDNISOLONE SODIUM SUCC 125 MG IJ SOLR
125.0000 mg | Freq: Once | INTRAMUSCULAR | Status: AC
  Administered 2014-02-23: 125 mg via INTRAVENOUS
  Filled 2014-02-23: qty 2

## 2014-02-23 MED ORDER — ALBUTEROL (5 MG/ML) CONTINUOUS INHALATION SOLN
10.0000 mg/h | INHALATION_SOLUTION | Freq: Once | RESPIRATORY_TRACT | Status: AC
  Administered 2014-02-23: 10 mg/h via RESPIRATORY_TRACT
  Filled 2014-02-23: qty 20

## 2014-02-23 MED ORDER — SODIUM CHLORIDE 0.9 % IV BOLUS (SEPSIS)
1000.0000 mL | Freq: Once | INTRAVENOUS | Status: DC
  Administered 2014-02-23: 1000 mL via INTRAVENOUS

## 2014-02-23 MED ORDER — IPRATROPIUM BROMIDE 0.02 % IN SOLN
0.5000 mg | Freq: Once | RESPIRATORY_TRACT | Status: AC
  Administered 2014-02-23: 0.5 mg via RESPIRATORY_TRACT
  Filled 2014-02-23: qty 2.5

## 2014-02-23 MED ORDER — ACETAMINOPHEN 500 MG PO TABS
1000.0000 mg | ORAL_TABLET | Freq: Once | ORAL | Status: AC
  Administered 2014-02-23: 1000 mg via ORAL
  Filled 2014-02-23: qty 2

## 2014-02-23 NOTE — ED Notes (Signed)
[facility] called to  take pt back to [facility]. Sabrina Carter

## 2014-02-23 NOTE — ED Notes (Signed)
MD at bedside. 

## 2014-02-23 NOTE — ED Notes (Signed)
Robert from respiratory made aware of pt breathing tx.

## 2014-02-23 NOTE — ED Notes (Signed)
Cough, sob began 1week ago. Productive cough, yellow in color. Audible wheezing. Hx of COPD. Pt states soreness to her chest and abdomen, worse with movement and palpation.

## 2014-02-23 NOTE — ED Provider Notes (Signed)
CSN: [id number]     Arrival date & time 02/23/14  1811 History   First MD Initiated Contact with Patient 02/23/14 1821     Chief Complaint  Patient presents with  . Shortness of Breath     (Consider location/radiation/quality/duration/timing/severity/associated sxs/prior Treatment) HPI  59 year old female with one week history of cough, shortness of breath, and wheezing. Has a prior history of COPD. Patient has been having subjective fevers as well as chills. Also endorsing headache, congestion, and sore throat. Started on Levaquin by her facility's physician a couple days prior. Also on steroids. Gets breathing treatments but they don't seem to help. Has been having chest pain with coughing. Cough is mildly productive.   Past Medical History  Diagnosis Date  . Altered mental status   . Hypertension   . Hyperlipidemia   . Diabetes mellitus without complication   . COPD (chronic obstructive pulmonary disease)   . Renal disorder     chronic kidney failure  . Hepatitis C   . Bipolar disorder    Past Surgical History  Procedure Laterality Date  . Unable     No family history on file. History  Substance Use Topics  . Smoking status: Unknown If Ever Smoked  . Smokeless tobacco: Not on file  . Alcohol Use: No   OB History    No data available     Review of Systems  Constitutional: Positive for fever and chills.  HENT: Positive for congestion and sore throat.   Respiratory: Positive for cough, shortness of breath and wheezing.   Cardiovascular: Positive for chest pain.  Musculoskeletal: Positive for myalgias.  Neurological: Positive for headaches.  All other systems reviewed and are negative.     Allergies  Codeine  Home Medications   Prior to Admission medications   Medication Sig Start Date End Date Taking? Authorizing Provider  acetaminophen (TYLENOL) 500 MG tablet Take 500 mg by mouth every 4 (four) hours as needed for mild pain or fever.    Historical Provider,  MD  albuterol (PROVENTIL) (2.5 MG/3ML) 0.083% nebulizer solution Take 2.5 mg by nebulization every 6 (six) hours as needed for wheezing or shortness of breath.    Historical Provider, MD  alum & mag hydroxide-simeth (MAALOX/MYLANTA) 200-200-20 MG/5ML suspension Take 30 mLs by mouth every 6 (six) hours as needed for indigestion or heartburn.    Historical Provider, MD  amLODipine (NORVASC) 10 MG tablet Take 10 mg by mouth daily.    Historical Provider, MD  diltiazem (DILACOR XR) 180 MG 24 hr capsule Take 180 mg by mouth daily.    Historical Provider, MD  Fluticasone-Salmeterol (ADVAIR) 100-50 MCG/DOSE AEPB Inhale 1 puff into the lungs 2 (two) times daily.    Historical Provider, MD  gabapentin (NEURONTIN) 300 MG capsule Take 300 mg by mouth 3 (three) times daily.    Historical Provider, MD  hydrochlorothiazide (HYDRODIURIL) 25 MG tablet Take 25 mg by mouth daily.    Historical Provider, MD  hydrOXYzine (ATARAX/VISTARIL) 25 MG tablet Take 25 mg by mouth every 6 (six) hours as needed for itching.    Historical Provider, MD  insulin detemir (LEVEMIR) 100 UNIT/ML injection Inject 40 Units into the skin at bedtime.    Historical Provider, MD  insulin lispro (HUMALOG) 100 UNIT/ML injection Inject 27 Units into the skin 3 (three) times daily. Give right after meals.  Hold if patient eats less than 1/2 of meal and if FSBS is less than 100.    Historical Provider, MD  lactulose (  CHRONULAC) 10 GM/15ML solution Take 20 g by mouth 4 (four) times daily.     Historical Provider, MD  lisinopril (PRINIVIL,ZESTRIL) 40 MG tablet Take 40 mg by mouth daily.    Historical Provider, MD  loperamide (IMODIUM) 2 MG capsule Take 2 mg by mouth as needed for diarrhea or loose stools.    Historical Provider, MD  magnesium hydroxide (MILK OF MAGNESIA) 400 MG/5ML suspension Take 30 mLs by mouth at bedtime as needed for mild constipation.    Historical Provider, MD  oxyCODONE-acetaminophen (PERCOCET/ROXICET) 5-325 MG per tablet Take  1 tablet by mouth every 4 (four) hours as needed for severe pain.    Historical Provider, MD  potassium chloride SA (K-DUR,KLOR-CON) 20 MEQ tablet Take 20 mEq by mouth 2 (two) times daily.    Historical Provider, MD  pregabalin (LYRICA) 100 MG capsule Take 100 mg by mouth 2 (two) times daily.    Historical Provider, MD  pregabalin (LYRICA) 150 MG capsule Take 150 mg by mouth at bedtime.    Historical Provider, MD  Psyllium (REGULOID) 48.57 % POWD Take 5 mLs by mouth every morning. 1 teaspoonful in 8 ounces of water every morning    Historical Provider, MD  QUEtiapine Fumarate (SEROQUEL XR) 150 MG 24 hr tablet Take 300 mg by mouth at bedtime.    Historical Provider, MD  senna (SENOKOT) 8.6 MG TABS tablet Take 2 tablets by mouth daily.    Historical Provider, MD  Skin Protectants, Misc. (EUCERIN) cream Apply 1 application topically 2 (two) times daily as needed for dry skin.    Historical Provider, MD  traMADol (ULTRAM) 50 MG tablet Take 50 mg by mouth every 6 (six) hours as needed for moderate pain.    Historical Provider, MD  Vitamin D, Ergocalciferol, (DRISDOL) 50000 UNITS CAPS capsule Take 50,000 Units by mouth every 7 (seven) days.    Historical Provider, MD   BP 136/67 mmHg  Pulse 100  Temp(Src) 98.6 F (37 C) (Oral)  Resp 22  Ht  (1.702 m)  Wt 250 lb (113.399 kg)  BMI 39.15 kg/m2  SpO2 100% Physical Exam  Constitutional: She is oriented to person, place, and time. She appears well-developed and well-nourished. No distress.  HENT:  Head: Normocephalic and atraumatic.  Right Ear: External ear normal.  Left Ear: External ear normal.  Nose: Nose normal.  Mouth/Throat: Oropharynx is clear and moist.  Neck: Neck supple.  Cardiovascular: Normal rate, regular rhythm, normal heart sounds and intact distal pulses.   HR right at 100  Pulmonary/Chest: Effort normal. No respiratory distress. She has wheezes (diffuse expiratory wheezes). She has no rales.  Speaks in complete sentences,  coughs frequently  Abdominal: Soft. She exhibits no distension. There is no tenderness.  Musculoskeletal: She exhibits no edema.  Neurological: She is alert and oriented to person, place, and time.  Skin: Skin is warm and dry. She is not diaphoretic. No pallor.  Nursing note and vitals reviewed.   ED Course  Procedures (including critical care time) Labs Review Labs Reviewed  COMPREHENSIVE METABOLIC PANEL - Abnormal; Notable for the following:    Glucose, Bld 167 (*)    Albumin 3.2 (*)    AST 83 (*)    ALT 70 (*)    Alkaline Phosphatase 157 (*)    GFR calc non Af Amer 53 (*)    GFR calc Af Amer 62 (*)    Anion gap 4 (*)    All other components within normal limits  CBC  WITH DIFFERENTIAL - Abnormal; Notable for the following:    RBC 3.64 (*)    Hemoglobin 10.9 (*)    HCT 33.2 (*)    Platelets 140 (*)    All other components within normal limits    Imaging Review Dg Chest Portable 1 View  02/23/2014   CLINICAL DATA:  Cough, wheezing and shortness of breath.  EXAM: PORTABLE CHEST - 1 VIEW  COMPARISON:  PA and lateral chest 03/01/2013 and 08/29/2013.  FINDINGS: There is cardiomegaly and mild interstitial edema. No consolidative process, pneumothorax or effusion.  IMPRESSION: Cardiomegaly and mild interstitial edema.   Electronically Signed   By: Drusilla Kannerhomas  Dalessio M.D.   On: 02/23/2014 18:54     EKG Interpretation   Date/Time:  Friday February 23 2014 18:22:34 EST Ventricular Rate:  100 PR Interval:  155 QRS Duration: 92 QT Interval:  352 QTC Calculation: 454 R Axis:   77 Text Interpretation:  Sinus tachycardia Otherwise normal ECG no  significant change since July 2015 Confirmed by Criss AlvineGOLDSTON  MD, Caya Soberanis ([id number])  on 02/23/2014 6:42:44 PM      MDM   Final diagnoses:  SOB (shortness of breath)  COPD exacerbation    Patient's dyspnea and cough resolved with an hour-long albuterol treatment. She was given IV Solu-Medrol as well. X-ray shows no evidence of pneumonia. At this  point on reexamination she has mild intermittent wheezing but states that her breathing feels completely normal to her. She is able to get up and walk to the bathroom without difficulty. I feel she is stable for discharge with continued care at her facility. She's artery on steroids, will continue this as well as the outpatient antibiotics since she's artery on them.    Audree CamelScott T Ponce Skillman, MD 02/23/14 2128

## 2014-02-23 NOTE — ED Notes (Signed)
Discharge instructions given, pt demonstrated teach back and verbal understanding. No concerns voiced.  

## 2014-05-09 ENCOUNTER — Emergency Department (HOSPITAL_COMMUNITY): Payer: Medicare Other

## 2014-05-09 ENCOUNTER — Inpatient Hospital Stay (HOSPITAL_COMMUNITY)
Admission: EM | Admit: 2014-05-09 | Discharge: 2014-05-11 | DRG: 683 | Disposition: A | Discharge status: Nursing Home (Includes ICF) | Payer: Medicare Other | Attending: Internal Medicine | Admitting: Internal Medicine

## 2014-05-09 ENCOUNTER — Encounter (HOSPITAL_COMMUNITY): Payer: Self-pay | Admitting: *Deleted

## 2014-05-09 DIAGNOSIS — E860 Dehydration: Secondary | ICD-10-CM | POA: Diagnosis present

## 2014-05-09 DIAGNOSIS — Z794 Long term (current) use of insulin: Secondary | ICD-10-CM

## 2014-05-09 DIAGNOSIS — E871 Hypo-osmolality and hyponatremia: Secondary | ICD-10-CM | POA: Diagnosis present

## 2014-05-09 DIAGNOSIS — I129 Hypertensive chronic kidney disease with stage 1 through stage 4 chronic kidney disease, or unspecified chronic kidney disease: Secondary | ICD-10-CM | POA: Diagnosis present

## 2014-05-09 DIAGNOSIS — E1140 Type 2 diabetes mellitus with diabetic neuropathy, unspecified: Secondary | ICD-10-CM | POA: Diagnosis present

## 2014-05-09 DIAGNOSIS — R531 Weakness: Secondary | ICD-10-CM | POA: Diagnosis not present

## 2014-05-09 DIAGNOSIS — T887XXA Unspecified adverse effect of drug or medicament, initial encounter: Secondary | ICD-10-CM

## 2014-05-09 DIAGNOSIS — D696 Thrombocytopenia, unspecified: Secondary | ICD-10-CM | POA: Diagnosis present

## 2014-05-09 DIAGNOSIS — E669 Obesity, unspecified: Secondary | ICD-10-CM | POA: Diagnosis present

## 2014-05-09 DIAGNOSIS — I10 Essential (primary) hypertension: Secondary | ICD-10-CM | POA: Diagnosis present

## 2014-05-09 DIAGNOSIS — B182 Chronic viral hepatitis C: Secondary | ICD-10-CM | POA: Diagnosis present

## 2014-05-09 DIAGNOSIS — Z87891 Personal history of nicotine dependence: Secondary | ICD-10-CM

## 2014-05-09 DIAGNOSIS — F319 Bipolar disorder, unspecified: Secondary | ICD-10-CM | POA: Diagnosis present

## 2014-05-09 DIAGNOSIS — K7460 Unspecified cirrhosis of liver: Secondary | ICD-10-CM | POA: Diagnosis present

## 2014-05-09 DIAGNOSIS — Z6836 Body mass index (BMI) 36.0-36.9, adult: Secondary | ICD-10-CM

## 2014-05-09 DIAGNOSIS — T500X5A Adverse effect of mineralocorticoids and their antagonists, initial encounter: Secondary | ICD-10-CM | POA: Diagnosis present

## 2014-05-09 DIAGNOSIS — D689 Coagulation defect, unspecified: Secondary | ICD-10-CM | POA: Diagnosis present

## 2014-05-09 DIAGNOSIS — E861 Hypovolemia: Secondary | ICD-10-CM | POA: Diagnosis present

## 2014-05-09 DIAGNOSIS — E875 Hyperkalemia: Secondary | ICD-10-CM

## 2014-05-09 DIAGNOSIS — N182 Chronic kidney disease, stage 2 (mild): Secondary | ICD-10-CM | POA: Diagnosis present

## 2014-05-09 DIAGNOSIS — K7290 Hepatic failure, unspecified without coma: Secondary | ICD-10-CM

## 2014-05-09 DIAGNOSIS — N179 Acute kidney failure, unspecified: Secondary | ICD-10-CM | POA: Diagnosis not present

## 2014-05-09 DIAGNOSIS — R296 Repeated falls: Secondary | ICD-10-CM | POA: Diagnosis present

## 2014-05-09 DIAGNOSIS — J449 Chronic obstructive pulmonary disease, unspecified: Secondary | ICD-10-CM | POA: Diagnosis present

## 2014-05-09 DIAGNOSIS — Z79899 Other long term (current) drug therapy: Secondary | ICD-10-CM | POA: Diagnosis not present

## 2014-05-09 DIAGNOSIS — R011 Cardiac murmur, unspecified: Secondary | ICD-10-CM | POA: Diagnosis not present

## 2014-05-09 DIAGNOSIS — K7682 Hepatic encephalopathy: Secondary | ICD-10-CM

## 2014-05-09 DIAGNOSIS — W19XXXA Unspecified fall, initial encounter: Secondary | ICD-10-CM | POA: Diagnosis present

## 2014-05-09 DIAGNOSIS — E785 Hyperlipidemia, unspecified: Secondary | ICD-10-CM | POA: Diagnosis present

## 2014-05-09 DIAGNOSIS — Y92129 Unspecified place in nursing home as the place of occurrence of the external cause: Secondary | ICD-10-CM | POA: Diagnosis not present

## 2014-05-09 HISTORY — DX: Hyperkalemia: E87.5

## 2014-05-09 HISTORY — DX: Chronic kidney disease, unspecified: N18.9

## 2014-05-09 HISTORY — DX: Unspecified adverse effect of drug or medicament, initial encounter: T88.7XXA

## 2014-05-09 LAB — CBC
HCT: 36.6 % (ref 36.0–46.0)
HEMOGLOBIN: 12.1 g/dL (ref 12.0–15.0)
MCH: 29.4 pg (ref 26.0–34.0)
MCHC: 33.1 g/dL (ref 30.0–36.0)
MCV: 88.8 fL (ref 78.0–100.0)
PLATELETS: 134 10*3/uL — AB (ref 150–400)
RBC: 4.12 MIL/uL (ref 3.87–5.11)
RDW: 14.7 % (ref 11.5–15.5)
WBC: 7.1 10*3/uL (ref 4.0–10.5)

## 2014-05-09 LAB — URINALYSIS, ROUTINE W REFLEX MICROSCOPIC
Bilirubin Urine: NEGATIVE
Glucose, UA: NEGATIVE mg/dL
Hgb urine dipstick: NEGATIVE
Ketones, ur: NEGATIVE mg/dL
Leukocytes, UA: NEGATIVE
NITRITE: NEGATIVE
PH: 5.5 (ref 5.0–8.0)
PROTEIN: NEGATIVE mg/dL
Specific Gravity, Urine: 1.015 (ref 1.005–1.030)
Urobilinogen, UA: 0.2 mg/dL (ref 0.0–1.0)

## 2014-05-09 LAB — DIFFERENTIAL
Basophils Absolute: 0.1 10*3/uL (ref 0.0–0.1)
Basophils Relative: 1 % (ref 0–1)
Eosinophils Absolute: 0.1 10*3/uL (ref 0.0–0.7)
Eosinophils Relative: 1 % (ref 0–5)
LYMPHS ABS: 1.9 10*3/uL (ref 0.7–4.0)
Lymphocytes Relative: 27 % (ref 12–46)
Monocytes Absolute: 1.3 10*3/uL — ABNORMAL HIGH (ref 0.1–1.0)
Monocytes Relative: 18 % — ABNORMAL HIGH (ref 3–12)
Neutro Abs: 3.7 10*3/uL (ref 1.7–7.7)
Neutrophils Relative %: 53 % (ref 43–77)

## 2014-05-09 LAB — APTT: aPTT: 35 seconds (ref 24–37)

## 2014-05-09 LAB — COMPREHENSIVE METABOLIC PANEL
ALBUMIN: 3.2 g/dL — AB (ref 3.5–5.2)
ALT: 111 U/L — AB (ref 0–35)
ANION GAP: 5 (ref 5–15)
AST: 132 U/L — AB (ref 0–37)
Alkaline Phosphatase: 116 U/L (ref 39–117)
BUN: 29 mg/dL — ABNORMAL HIGH (ref 6–23)
CO2: 22 mmol/L (ref 19–32)
Calcium: 8.7 mg/dL (ref 8.4–10.5)
Chloride: 102 mmol/L (ref 96–112)
Creatinine, Ser: 1.7 mg/dL — ABNORMAL HIGH (ref 0.50–1.10)
GFR calc Af Amer: 37 mL/min — ABNORMAL LOW (ref >90)
GFR, EST NON AFRICAN AMERICAN: 32 mL/min — AB (ref >90)
Glucose, Bld: 188 mg/dL — ABNORMAL HIGH (ref 70–99)
POTASSIUM: 6 mmol/L — AB (ref 3.5–5.1)
SODIUM: 129 mmol/L — AB (ref 135–145)
TOTAL PROTEIN: 7.6 g/dL (ref 6.0–8.3)
Total Bilirubin: 0.6 mg/dL (ref 0.3–1.2)

## 2014-05-09 LAB — RAPID URINE DRUG SCREEN, HOSP PERFORMED
Amphetamines: NOT DETECTED
Barbiturates: NOT DETECTED
Benzodiazepines: NOT DETECTED
Cocaine: NOT DETECTED
Opiates: NOT DETECTED
TETRAHYDROCANNABINOL: NOT DETECTED

## 2014-05-09 LAB — BLOOD GAS, ARTERIAL
Acid-base deficit: 2.7 mmol/L — ABNORMAL HIGH (ref 0.0–2.0)
BICARBONATE: 22 meq/L (ref 20.0–24.0)
Drawn by: 234301
FIO2: 21 %
O2 Saturation: 97.3 %
PCO2 ART: 40.3 mmHg (ref 35.0–45.0)
Patient temperature: 37
TCO2: 20 mmol/L (ref 0–100)
pH, Arterial: 7.356 (ref 7.350–7.450)
pO2, Arterial: 102 mmHg — ABNORMAL HIGH (ref 80.0–100.0)

## 2014-05-09 LAB — GLUCOSE, CAPILLARY: Glucose-Capillary: 94 mg/dL (ref 70–99)

## 2014-05-09 LAB — AMMONIA: Ammonia: 54 umol/L — ABNORMAL HIGH (ref 11–32)

## 2014-05-09 LAB — CBG MONITORING, ED: GLUCOSE-CAPILLARY: 127 mg/dL — AB (ref 70–99)

## 2014-05-09 LAB — PROTIME-INR
INR: 1.58 — AB (ref 0.00–1.49)
Prothrombin Time: 19 seconds — ABNORMAL HIGH (ref 11.6–15.2)

## 2014-05-09 LAB — I-STAT TROPONIN, ED: Troponin i, poc: 0 ng/mL (ref 0.00–0.08)

## 2014-05-09 LAB — ETHANOL

## 2014-05-09 LAB — POTASSIUM: Potassium: 6.2 mmol/L (ref 3.5–5.1)

## 2014-05-09 LAB — TSH: TSH: 2.181 u[IU]/mL (ref 0.350–4.500)

## 2014-05-09 MED ORDER — RISPERIDONE 1 MG PO TABS
1.0000 mg | ORAL_TABLET | Freq: Two times a day (BID) | ORAL | Status: DC
  Administered 2014-05-09 – 2014-05-11 (×4): 1 mg via ORAL
  Filled 2014-05-09 (×4): qty 1

## 2014-05-09 MED ORDER — SODIUM CHLORIDE 0.9 % IV BOLUS (SEPSIS)
1000.0000 mL | Freq: Once | INTRAVENOUS | Status: AC
  Administered 2014-05-09: 1000 mL via INTRAVENOUS

## 2014-05-09 MED ORDER — ALUM & MAG HYDROXIDE-SIMETH 200-200-20 MG/5ML PO SUSP: 30.0000 mL | Freq: Four times a day (QID) | ORAL | Status: DC | PRN

## 2014-05-09 MED ORDER — SODIUM BICARBONATE 8.4 % IV SOLN
INTRAVENOUS | Status: AC
  Filled 2014-05-09: qty 50

## 2014-05-09 MED ORDER — ACETAMINOPHEN 650 MG RE SUPP: 650.0000 mg | Freq: Four times a day (QID) | RECTAL | Status: DC | PRN

## 2014-05-09 MED ORDER — INSULIN ASPART 100 UNIT/ML ~~LOC~~ SOLN
0.0000 [IU] | Freq: Three times a day (TID) | SUBCUTANEOUS | Status: DC
  Administered 2014-05-10: 2 [IU] via SUBCUTANEOUS
  Administered 2014-05-11: 5 [IU] via SUBCUTANEOUS

## 2014-05-09 MED ORDER — FUROSEMIDE 10 MG/ML IJ SOLN
10.0000 mg | Freq: Once | INTRAMUSCULAR | Status: AC
Start: 2014-05-09 — End: 2014-05-09
  Administered 2014-05-09: 10 mg via INTRAVENOUS
  Filled 2014-05-09: qty 2

## 2014-05-09 MED ORDER — ALBUTEROL SULFATE (2.5 MG/3ML) 0.083% IN NEBU: 2.5000 mg | INHALATION_SOLUTION | Freq: Four times a day (QID) | RESPIRATORY_TRACT | Status: DC | PRN

## 2014-05-09 MED ORDER — SENNA 8.6 MG PO TABS
2.0000 | ORAL_TABLET | Freq: Every day | ORAL | Status: DC
  Administered 2014-05-10 – 2014-05-11 (×2): 17.2 mg via ORAL
  Filled 2014-05-09 (×2): qty 2

## 2014-05-09 MED ORDER — LACTULOSE 10 GM/15ML PO SOLN
20.0000 g | Freq: Three times a day (TID) | ORAL | Status: DC
  Administered 2014-05-09 – 2014-05-11 (×5): 20 g via ORAL
  Filled 2014-05-09 (×5): qty 30

## 2014-05-09 MED ORDER — ACETAMINOPHEN 325 MG PO TABS: 650.0000 mg | ORAL_TABLET | Freq: Four times a day (QID) | ORAL | Status: DC | PRN

## 2014-05-09 MED ORDER — PREGABALIN 50 MG PO CAPS
100.0000 mg | ORAL_CAPSULE | Freq: Two times a day (BID) | ORAL | Status: DC
  Administered 2014-05-10 – 2014-05-11 (×3): 100 mg via ORAL
  Filled 2014-05-09 (×4): qty 2

## 2014-05-09 MED ORDER — ONDANSETRON HCL 4 MG/2ML IJ SOLN: 4.0000 mg | Freq: Four times a day (QID) | INTRAMUSCULAR | Status: DC | PRN

## 2014-05-09 MED ORDER — INSULIN ASPART 100 UNIT/ML ~~LOC~~ SOLN
10.0000 [IU] | Freq: Once | SUBCUTANEOUS | Status: AC
  Administered 2014-05-09: 10 [IU] via INTRAVENOUS
  Filled 2014-05-09: qty 1

## 2014-05-09 MED ORDER — SODIUM CHLORIDE 0.9 % IV SOLN
INTRAVENOUS | Status: DC
  Administered 2014-05-09: 18:00:00 via INTRAVENOUS

## 2014-05-09 MED ORDER — DEXTROSE 50 % IV SOLN
1.0000 | Freq: Once | INTRAVENOUS | Status: AC
  Administered 2014-05-09: 50 mL via INTRAVENOUS

## 2014-05-09 MED ORDER — NORTRIPTYLINE HCL 25 MG PO CAPS
25.0000 mg | ORAL_CAPSULE | Freq: Every day | ORAL | Status: DC
  Administered 2014-05-09 – 2014-05-10 (×2): 25 mg via ORAL
  Filled 2014-05-09 (×2): qty 1

## 2014-05-09 MED ORDER — SODIUM CHLORIDE 0.45 % IV SOLN
INTRAVENOUS | Status: DC
  Administered 2014-05-09: 19:00:00 via INTRAVENOUS
  Filled 2014-05-09 (×8): qty 1000

## 2014-05-09 MED ORDER — ONDANSETRON HCL 4 MG PO TABS: 4.0000 mg | ORAL_TABLET | Freq: Four times a day (QID) | ORAL | Status: DC | PRN

## 2014-05-09 MED ORDER — MOMETASONE FURO-FORMOTEROL FUM 100-5 MCG/ACT IN AERO
2.0000 | INHALATION_SPRAY | Freq: Two times a day (BID) | RESPIRATORY_TRACT | Status: DC
  Administered 2014-05-10 – 2014-05-11 (×3): 2 via RESPIRATORY_TRACT
  Filled 2014-05-09: qty 8.8

## 2014-05-09 MED ORDER — GUAIFENESIN-DM 100-10 MG/5ML PO SYRP: 5.0000 mL | ORAL_SOLUTION | ORAL | Status: DC | PRN

## 2014-05-09 MED ORDER — LACTULOSE 10 GM/15ML PO SOLN
20.0000 g | Freq: Once | ORAL | Status: AC
  Administered 2014-05-09: 20 g via ORAL
  Filled 2014-05-09: qty 30

## 2014-05-09 MED ORDER — DEXTROSE 50 % IV SOLN
INTRAVENOUS | Status: AC
  Administered 2014-05-09: 50 mL via INTRAVENOUS
  Filled 2014-05-09: qty 50

## 2014-05-09 MED ORDER — INSULIN ASPART 100 UNIT/ML ~~LOC~~ SOLN: 0.0000 [IU] | Freq: Every day | SUBCUTANEOUS | Status: DC

## 2014-05-09 NOTE — ED Provider Notes (Signed)
5:30 PM  Pt is a 59 y.o. female with history of hepatitis C, cirrhosis, diabetes, hypertension, COPD who [facility] who presents emergency department with generalized weakness, confusion, difficulty performing her ADLs for the past 3 weeks. Patient is drowsy but arousable in the ED. Is unable to stand. Labs show mild acute renal failure with creatinine of 1.7, ammonia 54 and potassium of 6.2. Will treat with IV fluids and lactulose. EKG shows slightly prolonged PR interval but no other interval changes.  Will discuss with hospitalist for admission.  5:50 PM  When I reevaluated patient and she appears to have a very mild right-sided facial droop and some slurred speech that she reports has been present for 2 weeks. Unable to lift her legs off the bed but has normal movement in her upper extremities and reports normal sensation diffusely. Head CT showed no infarct or hemorrhage. She is oriented 3. Denies any chest pain or shortness of breath.   6:00 PM  D/w Dr. Sherrie MustacheFisher who recommends admission to telemetry, inpatient. She will come see the patient in place holding orders. She would also like for me to give the patient IV insulin and dextrose. We'll also obtain MRI of her brain given weakness in her lower extremities, facial droop on the right side and slurred speech.     EKG Interpretation  Date/Time:  Wednesday May 09 2014 15:12:23 EDT Ventricular Rate:  70 PR Interval:  211 QRS Duration: 91 QT Interval:  378 QTC Calculation: 408 R Axis:   58 Text Interpretation:  Sinus rhythm Borderline prolonged PR interval No significant change since last tracing Confirmed by Bebe ShaggyWICKLINE  MD, DONALD ([id number]) on 05/09/2014 3:22:46 PM        CRITICAL CARE Performed by: Raelyn NumberWARD, Yannet Rincon N   Total critical care time: 45 minutes - hyperkalemia  Critical care time was exclusive of separately billable procedures and treating other patients.  Critical care was necessary to treat or prevent imminent or  life-threatening deterioration.  Critical care was time spent personally by me on the following activities: development of treatment plan with patient and/or surrogate as well as nursing, discussions with consultants, evaluation of patient's response to treatment, examination of patient, obtaining history from patient or surrogate, ordering and performing treatments and interventions, ordering and review of laboratory studies, ordering and review of radiographic studies, pulse oximetry and re-evaluation of patient's condition.   Layla MawKristen N Leeta Grimme, DO 05/09/14 [phone]

## 2014-05-09 NOTE — ED Provider Notes (Signed)
CSN: [id number]     Arrival date & time 05/09/14  1426 History   First MD Initiated Contact with Patient 05/09/14 1433     Chief Complaint  Patient presents with  . Weakness     Patient is a 59 y.o. female presenting with weakness. The history is provided by the patient.  Weakness This is a recurrent problem. The current episode started more than 1 week ago. The problem occurs daily. The problem has been gradually worsening. Pertinent negatives include no chest pain, no abdominal pain, no headaches and no shortness of breath. Exacerbated by: activity. The symptoms are relieved by rest.  Pt presents for continuous generalized weakness for at least 3 weeks.  She is unsure of cause but is reportedly been admitted to outside hospitals previously.  She denies HA/CP/SOB.  She report nausea She reports frequent falls but no injury is reported.    Past Medical History  Diagnosis Date  . Altered mental status   . Hypertension   . Hyperlipidemia   . Diabetes mellitus without complication   . COPD (chronic obstructive pulmonary disease)   . Hepatitis C   . Bipolar disorder   . Renal disorder     chronic kidney failure   Past Surgical History  Procedure Laterality Date  . Unable     History reviewed. No pertinent family history. History  Substance Use Topics  . Smoking status: Unknown If Ever Smoked  . Smokeless tobacco: Not on file  . Alcohol Use: No   OB History    No data available     Review of Systems  Constitutional: Negative for fever.  Respiratory: Negative for shortness of breath.   Cardiovascular: Negative for chest pain.  Gastrointestinal: Positive for nausea. Negative for vomiting and abdominal pain.  Neurological: Positive for weakness. Negative for headaches.  All other systems reviewed and are negative.     Allergies  Codeine  Home Medications   Prior to Admission medications   Medication Sig Start Date End Date Taking? Authorizing Provider   acetaminophen (TYLENOL) 500 MG tablet Take 500 mg by mouth every 4 (four) hours as needed for mild pain or fever.    Historical Provider, MD  albuterol (PROVENTIL) (2.5 MG/3ML) 0.083% nebulizer solution Take 2.5 mg by nebulization every 6 (six) hours as needed for wheezing or shortness of breath.    Historical Provider, MD  alum & mag hydroxide-simeth (MAALOX/MYLANTA) 200-200-20 MG/5ML suspension Take 30 mLs by mouth every 6 (six) hours as needed for indigestion or heartburn.    Historical Provider, MD  amLODipine (NORVASC) 10 MG tablet Take 10 mg by mouth daily.    Historical Provider, MD  diltiazem (CARDIZEM CD) 240 MG 24 hr capsule Take 240 mg by mouth daily.    Historical Provider, MD  Fluticasone-Salmeterol (ADVAIR) 100-50 MCG/DOSE AEPB Inhale 1 puff into the lungs 2 (two) times daily.    Historical Provider, MD  furosemide (LASIX) 20 MG tablet Take 20 mg by mouth daily.    Historical Provider, MD  gabapentin (NEURONTIN) 300 MG capsule Take 300 mg by mouth 4 (four) times daily.     Historical Provider, MD  hydrochlorothiazide (HYDRODIURIL) 25 MG tablet Take 25 mg by mouth daily.    Historical Provider, MD  hydrOXYzine (ATARAX/VISTARIL) 25 MG tablet Take 25 mg by mouth every 6 (six) hours as needed for itching.    Historical Provider, MD  insulin glargine (LANTUS) 100 UNIT/ML injection Inject 40 Units into the skin at bedtime.  Historical Provider, MD  insulin lispro (HUMALOG) 100 UNIT/ML injection Inject 42 Units into the skin 3 (three) times daily after meals. Give right after meals.  Hold if patient eats less than 1/2 of meal and if FSBS is less than 100.    Historical Provider, MD  levofloxacin (LEVAQUIN) 500 MG tablet Take 500 mg by mouth daily. 10 day course starting on 02/21/2014    Historical Provider, MD  linagliptin (TRADJENTA) 5 MG TABS tablet Take 5 mg by mouth daily.    Historical Provider, MD  lisinopril (PRINIVIL,ZESTRIL) 40 MG tablet Take 40 mg by mouth daily.    Historical  Provider, MD  loperamide (IMODIUM) 2 MG capsule Take 2 mg by mouth as needed for diarrhea or loose stools.    Historical Provider, MD  magnesium hydroxide (MILK OF MAGNESIA) 400 MG/5ML suspension Take 30 mLs by mouth at bedtime as needed for mild constipation.    Historical Provider, MD  nicotine (NICODERM CQ - DOSED IN MG/24 HOURS) 21 mg/24hr patch Place 21 mg onto the skin daily.    Historical Provider, MD  nortriptyline (PAMELOR) 50 MG capsule Take 50 mg by mouth at bedtime.    Historical Provider, MD  potassium chloride SA (K-DUR,KLOR-CON) 20 MEQ tablet Take 20 mEq by mouth 2 (two) times daily with a meal.     Historical Provider, MD  predniSONE (DELTASONE) 20 MG tablet Take 20 mg by mouth daily after lunch. 7 day course taken at 1230p starting on 02/21/14    Historical Provider, MD  pregabalin (LYRICA) 100 MG capsule Take 100 mg by mouth 2 (two) times daily.    Historical Provider, MD  pregabalin (LYRICA) 150 MG capsule Take 150 mg by mouth at bedtime.    Historical Provider, MD  Psyllium (REGULOID) 48.57 % POWD Take 5 mLs by mouth every morning. 1 teaspoonful in 8 ounces of water every morning    Historical Provider, MD  risperiDONE (RISPERDAL) 2 MG tablet Take 2 mg by mouth 2 (two) times daily.    Historical Provider, MD  senna (SENOKOT) 8.6 MG TABS tablet Take 2 tablets by mouth daily.    Historical Provider, MD  Skin Protectants, Misc. (EUCERIN) cream Apply 1 application topically 2 (two) times daily as needed for dry skin.    Historical Provider, MD  traMADol (ULTRAM) 50 MG tablet Take 50 mg by mouth every 6 (six) hours as needed for moderate pain.    Historical Provider, MD  Vitamin D, Ergocalciferol, (DRISDOL) 50000 UNITS CAPS capsule Take 50,000 Units by mouth every 30 (thirty) days.     Historical Provider, MD   BP 109/67 mmHg  Temp(Src) 98.4 F (36.9 C) (Oral)  Resp 16  Ht  (1.727 m)  Wt 250 lb (113.399 kg)  BMI 38.02 kg/m2  SpO2 99% heart rate - 70 Physical  Exam CONSTITUTIONAL: elderly, appears somnolent HEAD: Normocephalic/atraumatic EYES: EOMI/PERRL ENMT: Mucous membranes moist NECK: supple no meningeal signs SPINE/BACK:entire spine nontender CV: S1/S2 noted, no murmurs/rubs/gallops noted LUNGS: Lungs are clear to auscultation bilaterally, no apparent distress ABDOMEN: soft, nontender, no rebound or guarding, bowel sounds noted throughout abdomen NEURO: Pt is somnolent but arousable.  She can move all extremities but poor effort in her lower extremities.  There is no facial droop.     EXTREMITIES: pulses normal/equal, full ROM, no tenderness noted SKIN: warm, color normal PSYCH: unable to fully assess  ED Course  Procedures   3:27 PM Pt from rest home who wanted patient evaluated for generalized  Weakness for 3 weeks.   Recent MRI abdomen results shows cirrhosis Pt has had recent elevated ammonia (72 in February) No recent neuro imaging that I can find in [facility] workup started Signed out to dr ward to f/u imaging/labs If patient can not ambulate, she may need admission or upgraded to higher level of nursing facility Pt is very sedated but unclear cause of her AMS  Labs Review Labs Reviewed  CBC - Abnormal; Notable for the following:    Platelets 134 (*)    All other components within normal limits  DIFFERENTIAL - Abnormal; Notable for the following:    Monocytes Relative 18 (*)    Monocytes Absolute 1.3 (*)    All other components within normal limits  BLOOD GAS, ARTERIAL - Abnormal; Notable for the following:    pO2, Arterial 102.0 (*)    Acid-base deficit 2.7 (*)    All other components within normal limits  ETHANOL  PROTIME-INR  APTT  COMPREHENSIVE METABOLIC PANEL  URINE RAPID DRUG SCREEN (HOSP PERFORMED)  URINALYSIS, ROUTINE W REFLEX MICROSCOPIC  AMMONIA  I-STAT TROPOININ, ED    Imaging Review No results found.   EKG Interpretation   Date/Time:  Wednesday May 09 2014 15:12:23 EDT Ventricular  Rate:  70 PR Interval:  211 QRS Duration: 91 QT Interval:  378 QTC Calculation: 408 R Axis:   58 Text Interpretation:  Sinus rhythm Borderline prolonged PR interval No  significant change since last tracing Confirmed by Bebe Shaggy  MD, Dorinda Hill  [phone]) on 05/09/2014 3:22:46 PM      MDM   Final diagnoses:  None    Nursing notes including past medical history and social history reviewed and considered in documentation Labs/vital reviewed myself and considered during evaluation Previous records reviewed and considered     Zadie Rhine, MD 05/09/14 1533

## 2014-05-09 NOTE — ED Notes (Signed)
RT called

## 2014-05-09 NOTE — ED Notes (Signed)
CRITICAL VALUE ALERT  Critical value received:  Potassium 6.2  Date of notification:  05/09/2014  Time of notification:  1710  Critical value read back:Yes.    Nurse who received alert:  Berdine DanceMandi Teresina Bugaj Rn  MD notified (1st page):  Ward  Time of first page:  1710  MD notified (2nd page):  Time of second page:  Responding MD:  .  Time MD responded:  .

## 2014-05-09 NOTE — ED Notes (Signed)
RT at bedside.

## 2014-05-09 NOTE — ED Notes (Signed)
RN swallow screen completed earlier.

## 2014-05-09 NOTE — ED Notes (Signed)
While doing orthostatic VS pt states she doesn't feel comfortable sitting on side of bed or standing beside of bed.

## 2014-05-09 NOTE — H&P (Addendum)
[facility] History and Physical  Sabrina Carter ZOX:[id number] DOB: [birth year] DOA: 05/09/2014  Referring physician: ED physician, Dr. Elesa MassedWard PCP: ??([facility])   Chief Complaint: Generalized weakness.  HPI: Sabrina Carter is a 59 y.o. female with a history of bipolar disorder, hepatitis C cirrhosis, diabetes mellitus, and chronic kidney disease, who presents to the emergency department with a chief complaint of generalized weakness and multiple falls at the assisted living facility-[facility]. The patient reports a few week history of generalized weakness with multiple falls at the assisted living facility. She denies any acute joint pain. She denies any head injury. She denies any loss of consciousness. She says that her legs have been weak and has been giving away when she attempts to stand. She denies unilateral weakness of her upper extremities or lower extremities. She denies difficulty swallowing or difficulty speaking. She denies headache. She has had a decline in her visual acuity which she attributes to previous cataract surgery. She denies any recent low blood sugars. She denies chest pain, fever, chills, upper respiratory infection symptoms, vomiting, or diarrhea. She has had some intermittent nausea. The dose of Lyrica was increased from 100 mg 3 times daily to 200 mg 3 times daily a few weeks ago. She believes that her generalized weakness and falls started after the increase.  In the ED, she was afebrile and hemodynamically stable, though her blood pressure was on the low-normal side. CT of her head revealed no acute abnormalities. Her chest x-ray revealed improved aeration with improvement in pulmonary vascular congestion. Her EKG reveals normal sinus rhythm-heart rate 70 bpm; borderline prolonged PR interval. She is being admitted for further evaluation and management.     Review of Systems:  As above in history present illness. In addition, she has  developed a tremor of her lips which she says started a few months ago. She also has chronic swelling of both of her legs. Otherwise, review of systems is negative.  Past Medical History  Diagnosis Date  . Altered mental status   . Hypertension   . Hyperlipidemia   . Diabetes mellitus without complication   . COPD (chronic obstructive pulmonary disease)   . Hepatitis C   . Bipolar disorder   . Renal disorder     chronic kidney failure   Past Surgical History  Procedure Laterality Date  .  cataract surgery.      Social History: Patient is single. She has no children. She is a resident of [facility] for more than 2 years. She has a history of illicit drug use, alcohol use, and tobacco use for which she is stopped all.    Allergies  Allergen Reactions  . Codeine    Family history: Her mother died of strokes. Her father died of cirrhosis.  Prior to Admission medications   Medication Sig Start Date End Date Taking? Authorizing Provider  acetaminophen (TYLENOL) 500 MG tablet Take 500 mg by mouth every 4 (four) hours as needed for mild pain or fever.   Yes Historical Provider, MD  albuterol (PROVENTIL) (2.5 MG/3ML) 0.083% nebulizer solution Take 2.5 mg by nebulization every 6 (six) hours as needed for wheezing or shortness of breath.   Yes Historical Provider, MD  amLODipine (NORVASC) 10 MG tablet Take 10 mg by mouth daily.   Yes Historical Provider, MD  diltiazem (CARDIZEM CD) 240 MG 24 hr capsule Take 240 mg by mouth daily.   Yes Historical Provider, MD  Exenatide ER 2 MG PEN Inject 2 mg  into the skin every Saturday.   Yes Historical Provider, MD  Fluticasone-Salmeterol (ADVAIR) 100-50 MCG/DOSE AEPB Inhale 1 puff into the lungs 2 (two) times daily.   Yes Historical Provider, MD  hydrochlorothiazide (HYDRODIURIL) 25 MG tablet Take 25 mg by mouth daily.   Yes Historical Provider, MD  hydrOXYzine (ATARAX/VISTARIL) 25 MG tablet Take 25 mg by mouth 3 (three) times daily as needed for  itching.    Yes Historical Provider, MD  insulin glargine (LANTUS) 100 UNIT/ML injection Inject 40 Units into the skin at bedtime.   Yes Historical Provider, MD  insulin lispro (HUMALOG) 100 UNIT/ML injection Inject 42 Units into the skin 3 (three) times daily after meals.    Yes Historical Provider, MD  lactulose (CHRONULAC) 10 GM/15ML solution Take 20 g by mouth 3 (three) times daily.   Yes Historical Provider, MD  linagliptin (TRADJENTA) 5 MG TABS tablet Take 5 mg by mouth daily.   Yes Historical Provider, MD  lisinopril (PRINIVIL,ZESTRIL) 40 MG tablet Take 40 mg by mouth daily.   Yes Historical Provider, MD  Melatonin 3 MG TABS Take 3 mg by mouth at bedtime.   Yes Historical Provider, MD  nortriptyline (PAMELOR) 50 MG capsule Take 50 mg by mouth at bedtime.   Yes Historical Provider, MD  pregabalin (LYRICA) 100 MG capsule Take 200 mg by mouth 3 (three) times daily.    Yes Historical Provider, MD  Psyllium (REGULOID) 48.57 % POWD Take 5 mLs by mouth every morning. 1 teaspoonful in 8 ounces of water every morning   Yes Historical Provider, MD  risperiDONE (RISPERDAL) 1 MG tablet Take 1 mg by mouth 2 (two) times daily.   Yes Historical Provider, MD  senna (SENOKOT) 8.6 MG TABS tablet Take 2 tablets by mouth daily.   Yes Historical Provider, MD  Skin Protectants, Misc. (EUCERIN) cream Apply 1 application topically 2 (two) times daily as needed for dry skin.   Yes Historical Provider, MD  spironolactone (ALDACTONE) 100 MG tablet Take 100 mg by mouth 2 (two) times daily.   Yes Historical Provider, MD  traMADol (ULTRAM) 50 MG tablet Take 50 mg by mouth every 6 (six) hours as needed for moderate pain.   Yes Historical Provider, MD   Physical Exam: Filed Vitals:   05/09/14 1530 05/09/14 1600 05/09/14 1630 05/09/14 1700  BP: 125/73 120/75 104/85 111/62  Pulse: 71 65 68 71  Temp:      TempSrc:      Resp: 11 11 10 12   Height:      Weight:      SpO2: 100% 100% 100% 100%    Wt Readings from Last 3  Encounters:  05/09/14 113.399 kg (250 lb)  02/23/14 113.399 kg (250 lb)  08/29/13 108.863 kg (240 lb)    General:  Obese 59 year old African-American woman laying in bed, in no acute distress. Eyes: PERRL, normal lids, irises & conjunctiva; conjunctivae are clear and sclerae are white. ENT: grossly normal hearing; oropharynx reveals mildly dry mucous membranes; no posterior exudates or erythema; full set of dentures. Neck: Obese; mild thyromegaly; no adenopathy; supple; no JVD. Cardiovascular: S1, S2, with a 2/6 systolic murmur. Bilateral lower extremity pitting edema-1+. Telemetry: SR, no arrhythmias  Respiratory: CTA bilaterally, no w/r/r. Normal respiratory effort. Abdomen: Obese, positive bowel sounds, soft, nontender, nondistended. Skin: no rash or induration seen on limited exam Musculoskeletal: grossly normal tone BUE/BLE; no acute hot red joints. Psychiatric: Mild flat affect; mild dysarthria, but speech appropriate. Neurologic: She is alert and oriented  3. She has mild dysarthria (query chronic). Cranial nerves II through XII are grossly intact with exception of mild dysarthria and some tremor of her lips. Handgrip bilaterally is 5 minus over 5. The patient is barely able to raise each leg against gravity. Sensation is grossly intact to cold touch.           Labs on Admission:  Basic Metabolic Panel:  Recent Labs Lab 05/09/14 1510 05/09/14 1615  NA 129*  --   K 6.0* 6.2*  CL 102  --   CO2 22  --   GLUCOSE 188*  --   BUN 29*  --   CREATININE 1.70*  --   CALCIUM 8.7  --    Liver Function Tests:  Recent Labs Lab 05/09/14 1510  AST 132*  ALT 111*  ALKPHOS 116  BILITOT 0.6  PROT 7.6  ALBUMIN 3.2*   No results for input(s): LIPASE, AMYLASE in the last 168 hours.  Recent Labs Lab 05/09/14 1533  AMMONIA 54*   CBC:  Recent Labs Lab 05/09/14 1510  WBC 7.1  NEUTROABS 3.7  HGB 12.1  HCT 36.6  MCV 88.8  PLT 134*   Cardiac Enzymes: No results for  input(s): CKTOTAL, CKMB, CKMBINDEX, TROPONINI in the last 168 hours.  BNP (last 3 results) No results for input(s): BNP in the last 8760 hours.  ProBNP (last 3 results) No results for input(s): PROBNP in the last 8760 hours.  CBG:  Recent Labs Lab 05/09/14 1815  GLUCAP 127*    Radiological Exams on Admission: Dg Chest 2 View  05/09/2014   CLINICAL DATA:  Breath, weakness  EXAM: CHEST  2 VIEW  COMPARISON:  Portable chest x-ray of 02/23/2014  FINDINGS: There has been improvement in aeration and in the mild pulmonary vascular congestion noted previously. No infiltrate or effusion is currently seen. Cardiomegaly is stable. No bony abnormality is seen.  IMPRESSION: Improved aeration with improvement in pulmonary vascular congestion.   Electronically Signed   By: Dwyane Dee M.D.   On: 05/09/2014 16:52   Ct Head Wo Contrast  05/09/2014   CLINICAL DATA:  Generalized weakness  EXAM: CT HEAD WITHOUT CONTRAST  TECHNIQUE: Contiguous axial images were obtained from the base of the skull through the vertex without intravenous contrast.  COMPARISON:  CT head 08/30/2013  FINDINGS: Ventricle size is normal.  Cerebral volume normal.  Negative for acute or chronic ischemia. Negative for hemorrhage or mass.  Calvarium intact.  IMPRESSION: Negative.  No change from the prior CT.   Electronically Signed   By: Marlan Palau M.D.   On: 05/09/2014 15:57    EKG: Independently reviewed.   Assessment/Plan Principal Problem:   Generalized weakness Active Problems:   Hyperkalemia   Acute renal injury   Hyponatremia   Falls   Diabetes mellitus with neuropathy   Hypertension   Chronic hepatitis C with cirrhosis   Bipolar disorder   Obesity   Thrombocytopenia   Polypharmacy   1. Generalized weakness with falls. Etiology, likely multifactorial including hyperkalemia, dehydration, polypharmacy-namely an increase in dosing of Lyrica in the setting of chronic treatment with nortriptyline, Risperdal,  hydralazine, and tramadol. Doubt acute cerebrovascular event, but will need to evaluate for this further. For treatment, will hold tramadol, and decrease the dose of nortriptyline from 50 mg daily at bedtime to 25 mg daily at bedtime and Lyrica from 200 mg 3 times a day to 100 mg twice a day. Will start IV fluid hydration. Will treat serum hyperkalemia.  2. Hyperkalemia. This is likely secondary to Aldactone and lisinopril in the setting of acute kidney injury. The patient received insulin and dextrose and lactulose by the EDP. Will start IV fluid hydration with half-normal saline with bicarbonate added. Will hold lisinopril and spironolactone. We'll give one small dose of IV Lasix. We'll continue to monitor her serum potassium. 3. Acute kidney injury. The patient's baseline creatinine is 1.1-1.2 as of a few months ago. It is 1.7 on admission. This is likely prerenal azotemia with concomitant ACE inhibitor and diuretic therapy. We'll start IV fluids as stated above. 4. Hyponatremia. This is likely from hypovolemia. Treat with IV fluids as above. 5. Thrombocytopenia and coagulopathy. Both are likely secondary to chronic hepatitis C cirrhosis. Will order a TSH and vitamin B12 to rule out deficiencies. 6. Insulin-dependent Diabetes with neuropathy. The patient's CBGs are within normal limits. She is treated with multiple medications including insulin. Will hold all of her diabetes medications with exception of sliding scale NovoLog. The dose of nortriptyline and Lyrica will be decreased. Will order hemoglobin A1c for further evaluation. 7. Hypertension. The patient is treated chronically with lisinopril, amlodipine, and ? Diltiazem. All of these medications will be withheld as she has hypovolemia and low-normal blood pressures. 8. Chronic hepatitis C with cirrhosis. Her ammonia level is mildly elevated, but I doubt hepatic encephalopathy. Continue lactulose. Hold spironolactone. 9. Bipolar disorder. We'll  continue Risperdal. The patient has a lip tremor, possibly secondary to tardive dyskinesia. Will consider starting Cogentin or discussing it with neurology. 10. Records per E- chart shows a history of hospice. Records from [facility] do not show this. We'll need to try to confirm this with the patient's PCP or hospice of [facility]. The patient could not provide further information about this.    Code Status: Full code DVT Prophylaxis: SCDs Family Communication: Family not available Disposition Plan: Discharge when clinically appropriate  Time spent: One hour and 10 minutes  [facility] Pager [phone]

## 2014-05-09 NOTE — ED Notes (Signed)
Pt. From [facility], EMS called out for general weakness. Staff states pt. Is not feeding herself or acting like herself x 3-4 weeks. [facility]. [facility] same.

## 2014-05-10 ENCOUNTER — Encounter (HOSPITAL_COMMUNITY): Payer: Self-pay

## 2014-05-10 DIAGNOSIS — R011 Cardiac murmur, unspecified: Secondary | ICD-10-CM

## 2014-05-10 DIAGNOSIS — R531 Weakness: Secondary | ICD-10-CM

## 2014-05-10 DIAGNOSIS — T887XXA Unspecified adverse effect of drug or medicament, initial encounter: Secondary | ICD-10-CM | POA: Diagnosis present

## 2014-05-10 LAB — COMPREHENSIVE METABOLIC PANEL
ALBUMIN: 2.9 g/dL — AB (ref 3.5–5.2)
ALT: 107 U/L — ABNORMAL HIGH (ref 0–35)
AST: 128 U/L — AB (ref 0–37)
Alkaline Phosphatase: 109 U/L (ref 39–117)
Anion gap: 5 (ref 5–15)
BUN: 20 mg/dL (ref 6–23)
CALCIUM: 8.9 mg/dL (ref 8.4–10.5)
CO2: 26 mmol/L (ref 19–32)
Chloride: 107 mmol/L (ref 96–112)
Creatinine, Ser: 1.22 mg/dL — ABNORMAL HIGH (ref 0.50–1.10)
GFR calc Af Amer: 55 mL/min — ABNORMAL LOW (ref >90)
GFR calc non Af Amer: 47 mL/min — ABNORMAL LOW (ref >90)
Glucose, Bld: 105 mg/dL — ABNORMAL HIGH (ref 70–99)
Potassium: 5 mmol/L (ref 3.5–5.1)
Sodium: 138 mmol/L (ref 135–145)
Total Bilirubin: 1 mg/dL (ref 0.3–1.2)
Total Protein: 7 g/dL (ref 6.0–8.3)

## 2014-05-10 LAB — CBC
HCT: 36.8 % (ref 36.0–46.0)
Hemoglobin: 12.1 g/dL (ref 12.0–15.0)
MCH: 29.4 pg (ref 26.0–34.0)
MCHC: 32.9 g/dL (ref 30.0–36.0)
MCV: 89.3 fL (ref 78.0–100.0)
Platelets: 115 10*3/uL — ABNORMAL LOW (ref 150–400)
RBC: 4.12 MIL/uL (ref 3.87–5.11)
RDW: 14.8 % (ref 11.5–15.5)
WBC: 5.9 10*3/uL (ref 4.0–10.5)

## 2014-05-10 LAB — GLUCOSE, CAPILLARY
GLUCOSE-CAPILLARY: 110 mg/dL — AB (ref 70–99)
Glucose-Capillary: 129 mg/dL — ABNORMAL HIGH (ref 70–99)
Glucose-Capillary: 116 mg/dL — ABNORMAL HIGH (ref 70–99)
Glucose-Capillary: 125 mg/dL — ABNORMAL HIGH (ref 70–99)

## 2014-05-10 LAB — T4, FREE: Free T4: 0.84 ng/dL (ref 0.80–1.80)

## 2014-05-10 LAB — VITAMIN B12: VITAMIN B 12: 1138 pg/mL — AB (ref 211–911)

## 2014-05-10 LAB — PROTIME-INR
INR: 1.56 — AB (ref 0.00–1.49)
Prothrombin Time: 18.8 seconds — ABNORMAL HIGH (ref 11.6–15.2)

## 2014-05-10 MED ORDER — BENZTROPINE MESYLATE 1 MG PO TABS
0.5000 mg | ORAL_TABLET | Freq: Every day | ORAL | Status: DC
  Administered 2014-05-10 – 2014-05-11 (×2): 0.5 mg via ORAL
  Filled 2014-05-10 (×2): qty 1

## 2014-05-10 NOTE — Evaluation (Signed)
Physical Therapy Evaluation Patient Details Name: Sabrina Carter MRN: [id number] DOB: [birth year] Today's Date: 05/10/2014   History of Present Illness  Sabrina Carter is a 59 y.o. female with a history of bipolar disorder, hepatitis C cirrhosis, diabetes mellitus, and chronic kidney disease, who presents to the emergency department with a chief complaint of generalized weakness and multiple falls at the assisted living facility[facility]. The patient reports a few week history of generalized weakness with multiple falls at the assisted living facility. She denies any acute joint pain. She denies any head injury. She denies any loss of consciousness. She says that her legs have been weak and has been giving away when she attempts to stand. She denies unilateral weakness of her upper extremities or lower extremities. She denies difficulty swallowing or difficulty speaking. She denies headache. She has had a decline in her visual acuity which she attributes to previous cataract surgery. She denies any recent low blood sugars. She denies chest pain, fever, chills, upper respiratory infection symptoms, vomiting, or diarrhea. She has had some intermittent nausea.  The dose of Lyrica was increased from 100 mg 3 times daily to 200 mg 3 times daily a few weeks ago. She believes that her generalized weakness and falls started after the increase.  In the ED, she was afebrile and hemodynamically stable, though her blood pressure was on the low-normal side. CT of her head revealed no acute abnormalities. Her chest x-ray revealed improved aeration with improvement in pulmonary vascular congestion. Her EKG reveals normal sinus rhythm-heart rate 70 bpm; borderline prolonged PR interval.   Clinical Impression  Patient found in room with CNA and nursing staff preparing to get up and go to the bathroom. Requires cues for proper hand placement and sequencing, but generally able to perform functional transfers and general  mobility with Min guard. See eval for gait distance and gait deviations. General weakness in bilateral lower extremities and balance deficits noted. Patient states that before her frequent falling began she had been able to perform all mobility and ADL tasks without difficulty; states she has had approximately 4-5 falls in the past month or two with no injury. Vitals remained WNL during session with O2 at 95% per pulse ox at end of session. Nurse notified regarding O2 sat response to activity as well as IV beeping; cleared PT to leave nasal cannula off of patient at this time due to WNL O2 saturations. Patient will benefit from continued skilled PT services while she is in this facility; she should be able to return to [facility] at time of discharge and will strongly benefit from receiving skilled PT services when she does return to assisted living facility.     Follow Up Recommendations Other (comment) (PT at assisted living facility)    Equipment Recommendations  Other (comment)    Recommendations for Other Services OT consult     Precautions / Restrictions Precautions Precautions: Fall Restrictions Weight Bearing Restrictions: No      Mobility  Bed Mobility Overal bed mobility: Needs Assistance Bed Mobility: Sit to Supine       Sit to supine: Supervision   General bed mobility comments: cues for sequencing  Transfers Overall transfer level: Needs assistance Equipment used: Standard walker Transfers: Sit to/from Stand Sit to Stand: Min guard         General transfer comment: cues for sequencing and appropriate hand placement  Ambulation/Gait Ambulation/Gait assistance: Min guard Ambulation Distance (Feet): 15 Feet (85ft x 2) Assistive device: Standard walker Gait  Pattern/deviations: Decreased step length - right;Decreased step length - left;Decreased stance time - right;Decreased stance time - left;Decreased stride length;Decreased dorsiflexion - right;Decreased  dorsiflexion - left;Decreased weight shift to right;Decreased weight shift to left;Trunk flexed     General Gait Details: cues for navigation and proper use of assistive device  Stairs            Wheelchair Mobility    Modified Rankin (Stroke Patients Only)       Balance Overall balance assessment: History of Falls;Needs assistance Sitting-balance support: No upper extremity supported;Feet supported Sitting balance-Leahy Scale: Good     Standing balance support: No upper extremity supported Standing balance-Leahy Scale: Fair Standing balance comment: Fair + to Good - with walker and bilateral upper extremity support Single Leg Stance - Right Leg:  (unable) Single Leg Stance - Left Leg:  (unable)           High Level Balance Comments: able to maintain static stance with eyes closed and feet shoulder width apart however large amount of postural sway             Pertinent Vitals/Pain Pain Assessment: No/denies pain    Home Living Family/patient expects to be discharged to:: Assisted living                      Prior Function Level of Independence: Independent with assistive device(s)         Comments: patient states she was independent with all tasks and walking with walker however she had been falling a lot recently     Hand Dominance        Extremity/Trunk Assessment               Lower Extremity Assessment: Generalized weakness      Cervical / Trunk Assessment: Normal (poor core strength)  Communication      Cognition Arousal/Alertness: Awake/alert Behavior During Therapy: WFL for tasks assessed/performed Overall Cognitive Status: Within Functional Limits for tasks assessed                      General Comments      Exercises        Assessment/Plan    PT Assessment Patient needs continued PT services  PT Diagnosis Abnormality of gait;Generalized weakness   PT Problem List Decreased strength;Decreased  coordination;Decreased activity tolerance;Decreased balance;Decreased safety awareness;Obesity;Decreased mobility;Decreased knowledge of use of DME  PT Treatment Interventions DME instruction;Balance training;Gait training;Neuromuscular re-education;Functional mobility training;Patient/family education;Therapeutic activities;Therapeutic exercise   PT Goals (Current goals can be found in the Care Plan section) Acute Rehab PT Goals Patient Stated Goal: to be able to walk better without falling PT Goal Formulation: With patient Time For Goal Achievement: 05/24/14 Potential to Achieve Goals: Good    Frequency Min 3X/week   Barriers to discharge        Co-evaluation               End of Session Equipment Utilized During Treatment: Gait belt Activity Tolerance: Patient tolerated treatment well Patient left: in bed;with call bell/phone within reach;with bed alarm set Nurse Communication: Other (comment) (O2 Sat response to activity, IV beeping)         Time: 6440-3474: 0815-0838 PT Time Calculation (min) (ACUTE ONLY): 23 min   Charges:   PT Evaluation $Initial PT Evaluation Tier I: 1 Procedure     PT G Codes:        Nedra HaiKristen Unger PT, DPT [phone]

## 2014-05-10 NOTE — Progress Notes (Signed)
UR chart review completed.  

## 2014-05-10 NOTE — Clinical Social Work Psychosocial (Signed)
Clinical Social Work Department BRIEF PSYCHOSOCIAL ASSESSMENT 05/10/2014  Patient:  Sabrina Carter, Sabrina Carter     Account Number:  [id number]     Admit date:  05/09/2014  Clinical Social Worker:  Wyatt Haste  Date/Time:  05/10/2014 10:33 AM  Referred by:  CSW  Date Referred:  05/10/2014 Referred for  ALF Placement   Other Referral:   Interview type:  Patient Other interview type:   sister- Sabrina Carter    PSYCHOSOCIAL DATA Living Status:  FACILITY Admitted from facility:  [facility] Level of care:  Assisted Living Primary support name:  Sabrina Carter Primary support relationship to patient:  [place] Degree of support available:   supportive    CURRENT CONCERNS Current Concerns  Post-Acute Placement   Other Concerns:    SOCIAL WORK ASSESSMENT / PLAN CSW met with pt and pt's sister, Sabrina Carter at bedside. Pt alert and oriented and reports she has been a resident at [facility] for about 2 years. She describes [facility] as her best support and she visits regularly. Pt states she has been weaker recently and had several falls. She has required assistance with all ADLs. Pt ambulates with walker and assist. There was some question about hospice involvement, but pt reports she does not receive hospice services. Matt Ward, PA with [facility] in [place] comes to facility to see pt. PT evaluated pt this morning and recommend home health at ALF. Pt requests to return to [facility] at d/c. She reports she has adjusted well there and is happy to have recommendation for home health PT. Heather at facility confirms above information. She indicates staff has been concerned about pt's decline recently. She has been seeing a liver specialist at [facility]. Nira Conn was made aware of home health recommendation and is agreeable. Okay to return, but may need to be reassessed if pt remains in hospital 3 days.   Assessment/plan status:  Psychosocial Support/Ongoing Assessment of Needs Other assessment/ plan:    Information/referral to community resources:   [place] for home health    PATIENT'S/FAMILY'S RESPONSE TO PLAN OF CARE: Pt and sister report positive feelings regarding return to [facility] when medically stable. CSW will continue to follow.       Benay Pike, [place]

## 2014-05-10 NOTE — Evaluation (Signed)
Occupational Therapy Evaluation Patient Details Name: Sabrina Carter MRN: [id number] DOB: 1955-11-05 Today's Date: 05/10/2014    History of Present Illness Sabrina Carter is a 59 y.o. female with a history of bipolar disorder, hepatitis C cirrhosis, diabetes mellitus, and chronic kidney disease, who presents to the emergency department with a chief complaint of generalized weakness and multiple falls at the assisted living facility-[facility]. The patient reports a few week history of generalized weakness with multiple falls at the assisted living facility. She denies any acute joint pain. She denies any head injury. She denies any loss of consciousness. She says that her legs have been weak and has been giving away when she attempts to stand. She denies unilateral weakness of her upper extremities or lower extremities. She denies difficulty swallowing or difficulty speaking. She denies headache. She has had a decline in her visual acuity which she attributes to previous cataract surgery. She denies any recent low blood sugars. She denies chest pain, fever, chills, upper respiratory infection symptoms, vomiting, or diarrhea. She has had some intermittent nausea.  The dose of Lyrica was increased from 100 mg 3 times daily to 200 mg 3 times daily a few weeks ago. She believes that her generalized weakness and falls started after the increase.  In the ED, she was afebrile and hemodynamically stable, though her blood pressure was on the low-normal side. CT of her head revealed no acute abnormalities. Her chest x-ray revealed improved aeration with improvement in pulmonary vascular congestion. Her EKG reveals normal sinus rhythm-heart rate 70 bpm; borderline prolonged PR interval.    Clinical Impression   PTA pt living at [facility] for past 2 years. Pt awake, alert, and oriented x2 (person, place) upon OTR entering room. Pt reports she has recently begun receiving assistance during dressing and  bathing tasks, meal preparation, and medication management at ALF. Pt declined to sit at EOB to further evaluate ADL management. Pt demonstrates range of motion WFL, and decreased strength (3+/5). Pt reports she uses a 4-wheeled rolling walker for functional mobility purposes at home, and has a walk-in shower with shower chair, grab bars, and hand-held shower head. Unable to determine cognitive status at this time, pt providing inconsistent reports as to prior function and assistance received at ALF. Pt appears to be at baseline, no further acute OT services required. Recommend OT evaluation upon return to [facility] (ALF).    Follow Up Recommendations  Other (comment);Supervision/Assistance - 24 hour (Recommend OT evaluation at assisted living facility)          Precautions / Restrictions Precautions Precautions: Fall Restrictions Weight Bearing Restrictions: No      Mobility Bed Mobility Overal bed mobility: Needs Assistance Bed Mobility: Sit to Supine       Sit to supine: Supervision   General bed mobility comments: cues for sequencing  Transfers Overall transfer level: Needs assistance Equipment used: Standard walker Transfers: Sit to/from Stand Sit to Stand: Min guard         General transfer comment: cues for sequencing and appropriate hand placement    Balance Overall balance assessment: History of Falls;Needs assistance Sitting-balance support: No upper extremity supported;Feet supported Sitting balance-Leahy Scale: Good     Standing balance support: No upper extremity supported Standing balance-Leahy Scale: Fair Standing balance comment: Fair + to Good - with walker and bilateral upper extremity support Single Leg Stance - Right Leg:  (unable) Single Leg Stance - Left Leg:  (unable)  High Level Balance Comments: able to maintain static stance with eyes closed and feet shoulder width apart however large amount of postural sway             ADL Overall ADL's : Needs assistance/impaired;At baseline Eating/Feeding: Set up Eating/Feeding Details (indicate cue type and reason): Pt required assistance in opening containers, utensils.                                  Functional mobility during ADLs: Min guard;Rolling walker General ADL Comments: Pt reports [facility]. Pt declined to sit at EOB to further evaluate ADL management     Vision Vision Assessment?: Yes Eye Alignment: Within Functional Limits Ocular Range of Motion: Within Functional Limits Alignment/Gaze Preference: Within Defined Limits Tracking/Visual Pursuits: Other (comment) (delayed tracking in bilateral eyes) Saccades: Within functional limits Convergence: Within functional limits Visual Fields: No apparent deficits          Pertinent Vitals/Pain Pain Assessment: No/denies pain     Hand Dominance     Extremity/Trunk Assessment Upper Extremity Assessment Upper Extremity Assessment: Generalized weakness   Lower Extremity Assessment Lower Extremity Assessment: Defer to PT evaluation   Cervical / Trunk Assessment Cervical / Trunk Assessment: Normal (poor core strength)   Communication Communication Communication: No difficulties   Cognition Arousal/Alertness: Awake/alert Behavior During Therapy: WFL for tasks assessed/performed Overall Cognitive Status: Difficult to assess (Pt giving inconsistent reports of prior and current function)                                Home Living Family/patient expects to be discharged to:: Assisted living                                        Prior Functioning/Environment Level of Independence: Needs assistance    ADL's / Homemaking Assistance Needed: Pt reports she was receiving assistance in dressing, bathing, meal preparatio, and medication management [facility]   Comments: Pt reports she uses walker for  functional mobility and has been falling often    OT Diagnosis: Generalized weakness;Cognitive deficits   OT Problem List: Decreased strength;Decreased activity tolerance;Decreased cognition;Decreased safety awareness       End of Session    Activity Tolerance: Patient tolerated treatment well Patient left: in bed;with call bell/phone within reach;with bed alarm set   Time: [id number] OT Time Calculation (min): 22 min Charges:  OT General Charges $OT Visit: 1 Procedure OT Evaluation $Initial OT Evaluation Tier I: 1 Procedure  Ezra SitesLeslie Ziah Leandro, OTR/L  [phone]  05/10/2014, 9:20 AM

## 2014-05-10 NOTE — Progress Notes (Signed)
*  PRELIMINARY RESULTS* Echocardiogram 2D Echocardiogram has been performed.  Sabrina Carter, [name] 05/10/2014, 3:42 PM

## 2014-05-10 NOTE — Progress Notes (Signed)
[facility] PROGRESS NOTE  Sabrina Carter ZOX:[id number] DOB: 03-Sep-1955 DOA: 05/09/2014 PCP: Dr. Elesa Massed (? PA)    Code Status: Full code Family Communication: Discussed with the patient's sister. Disposition Plan: Discharge back to assisted living facility in the next 24-48 hours.   Consultants:  None  Procedures:  2-D echocardiogram, pending  Antibiotics:  None  HPI/Subjective: Patient says that she feels better. She says "I'm hungry". Chest no complaints of headache, chest pain, difficulty swallowing or chewing, or difficulty talking. She was able to ambulate today with assistance.  Objective: Filed Vitals:   05/10/14 0500  BP: 118/60  Pulse: 87  Temp: 98.6 F (37 C)  Resp: 17   No intake or output data in the 24 hours ending 05/10/14 1226 Filed Weights   05/09/14 1429 05/09/14 2040 05/10/14 0500  Weight: 113.399 kg (250 lb) 113.127 kg (249 lb 6.4 oz) 111.2 kg (245 lb 2.4 oz)    Exam:   General:  Obese African-American woman sitting up in bed. No acute distress. She looks much better.  Cardiovascular: S1, S2, with a 1 to 2/6 systolic murmur.  Respiratory: Breathing nonlabored. Lungs clear to auscultation bilateral.  Abdomen: Obese, positive bowel sounds, soft, nontender, nondistended.  Musculoskeletal/extremities: No acute hydrated joints. Lower extremity edema decreased down to trace of pedal edema.  Neurologic/psychiatric: She is alert and oriented 3. No evidence of dysphasia or dysarthria. Cranial nerves II through XII are intact; except that she does have a mild rotary lip tremor. The patient is now able to raise each leg against gravity. Her handgrip is 5 over 5 bilaterally.  Data Reviewed: Basic Metabolic Panel:  Recent Labs Lab 05/09/14 1510 05/09/14 1615 05/10/14 0648  NA 129*  --  138  K 6.0* 6.2* 5.0  CL 102  --  107  CO2 22  --  26  GLUCOSE 188*  --  105*  BUN 29*  --  20  CREATININE 1.70*  --  1.22*  CALCIUM 8.7  --  8.9    Liver Function Tests:  Recent Labs Lab 05/09/14 1510 05/10/14 0648  AST 132* 128*  ALT 111* 107*  ALKPHOS 116 109  BILITOT 0.6 1.0  PROT 7.6 7.0  ALBUMIN 3.2* 2.9*   No results for input(s): LIPASE, AMYLASE in the last 168 hours.  Recent Labs Lab 05/09/14 1533  AMMONIA 54*   CBC:  Recent Labs Lab 05/09/14 1510 05/10/14 0648  WBC 7.1 5.9  NEUTROABS 3.7  --   HGB 12.1 12.1  HCT 36.6 36.8  MCV 88.8 89.3  PLT 134* 115*   Cardiac Enzymes: No results for input(s): CKTOTAL, CKMB, CKMBINDEX, TROPONINI in the last 168 hours. BNP (last 3 results) No results for input(s): BNP in the last 8760 hours.  ProBNP (last 3 results) No results for input(s): PROBNP in the last 8760 hours.  CBG:  Recent Labs Lab 05/09/14 1815 05/09/14 2155 05/10/14 0755 05/10/14 1205  GLUCAP 127* 94 110* 129*    No results found for this or any previous visit (from the past 240 hour(s)).   Studies: Dg Chest 2 View  05/09/2014   CLINICAL DATA:  Breath, weakness  EXAM: CHEST  2 VIEW  COMPARISON:  Portable chest x-ray of 02/23/2014  FINDINGS: There has been improvement in aeration and in the mild pulmonary vascular congestion noted previously. No infiltrate or effusion is currently seen. Cardiomegaly is stable. No bony abnormality is seen.  IMPRESSION: Improved aeration with improvement in pulmonary vascular congestion.   Electronically Signed  By: Dwyane DeePaul  Barry M.D.   On: 05/09/2014 16:52   Ct Head Wo Contrast  05/09/2014   CLINICAL DATA:  Generalized weakness  EXAM: CT HEAD WITHOUT CONTRAST  TECHNIQUE: Contiguous axial images were obtained from the base of the skull through the vertex without intravenous contrast.  COMPARISON:  CT head 08/30/2013  FINDINGS: Ventricle size is normal.  Cerebral volume normal.  Negative for acute or chronic ischemia. Negative for hemorrhage or mass.  Calvarium intact.  IMPRESSION: Negative.  No change from the prior CT.   Electronically Signed   By: Marlan Palauharles  Clark  M.D.   On: 05/09/2014 15:57   Mr Brain Wo Contrast  05/09/2014   CLINICAL DATA:  Generalized Weakness for 1 month. No known injury. RIGHT facial droop with dysarthria.  EXAM: MRI HEAD WITHOUT CONTRAST  TECHNIQUE: Multiplanar, multiecho pulse sequences of the brain and surrounding structures were obtained without intravenous contrast.  COMPARISON:  CT head earlier today.  FINDINGS: The patient was unable to remain motionless for the exam. Small or subtle lesions could be overlooked.  No evidence for acute infarction, hemorrhage, mass lesion, hydrocephalus, or extra-axial fluid. Mild cerebral and cerebellar atrophy. No significant white matter disease. Partial empty sella. No pineal or cerebellar tonsil abnormality.  Flow voids are preserved in the major intracranial vessels. No areas of chronic hemorrhage. There is a remote medial blowout fracture LEFT orbit. No sinus air-fluid levels. Negative mastoids. Good general agreement with prior CT.  IMPRESSION: No acute intracranial findings.  Chronic changes as described.   Electronically Signed   By: Davonna BellingJohn  Curnes M.D.   On: 05/09/2014 19:35    Scheduled Meds: . insulin aspart  0-15 Units Subcutaneous TID WC  . insulin aspart  0-5 Units Subcutaneous QHS  . lactulose  20 g Oral TID  . mometasone-formoterol  2 puff Inhalation BID  . nortriptyline  25 mg Oral QHS  . pregabalin  100 mg Oral BID  . risperiDONE  1 mg Oral BID  . senna  2 tablet Oral Daily   Continuous Infusions: . sodium chloride 0.45 % 1,000 mL with sodium bicarbonate 50 mEq infusion 100 mL/hr at 05/09/14 1915   Assessment and plan:  Principal Problem:   Generalized weakness Active Problems:   Hyperkalemia   Acute renal injury   Hyponatremia   Falls   Diabetes mellitus with neuropathy   Hypertension   Chronic hepatitis C with cirrhosis   Bipolar disorder   Obesity   Thrombocytopenia   Polypharmacy   Coagulopathy   Generalized weakness with falls.  Etiology likely  multifactorial including hyperkalemia, dehydration, polypharmacy/medication side effect-namely an increase in dosing of Lyrica in the setting of chronic treatment with nortriptyline, Risperdal, hydroxyzine, and tramadol. MRI of her brain revealed no acute intracranial findings.  For treatment, tramadol and hydroxyzine were withheld. Nortriptyline was decreased from 50-25 mg and Lyrica was decreased from 2 large milligrams 3 times a day to 100 mg twice daily. IV fluids were started for hydration. Hyperkalemia was treated. She has improved clinically and symptomatically.  Physical therapy and occupational therapy evaluated the patient and recommended home therapy.  Hyperkalemia.  This was likely secondary to Aldactone and lisinopril in the setting of acute kidney injury.  The patient received insulin and dextrose and lactulose by the EDP.  She was started on IV fluid hydration with half-normal saline with bicarbonate added. She was given 1 dose of IV Lasix. Lisinopril and spironolactone were withheld. We'll continue holding these for now. Her serum potassium  has improved.  Acute kidney injury; superimposed on stage II chronic kidney disease.  The patient's baseline creatinine is 1.1-1.2 as of a few months ago. It is 1.7 on admission. This was likely prerenal azotemia with concomitant ACE inhibitor and diuretic therapy. She was started on IV fluids. Lisinopril and HCTZ were withheld. Her renal function improved to baseline.  Hyponatremia.  This was likely from hypovolemia. Following IV fluid hydration with normal saline, her serum sodium improved.  Thrombocytopenia and coagulopathy.  Both were likely secondary to chronic hepatitis C cirrhosis.  Her TSH was within normal limits. Free T4 and vitamin B12 level are pending.   Insulin-dependent Diabetes with neuropathy.  The patient's CBGs were within normal limits on admission.. She is treated with multiple medications including insulin. These  medications are being held with exception of sliding scale NovoLog. Her CBGs are still reasonable. The dose of nortriptyline and Lyrica were decreased. Hemoglobin A1c is pending.  Hypertension. The patient is treated chronically with lisinopril, amlodipine, and ? Diltiazem. All of these medications were withheld on admission as she had hypovolemia and low-normal blood pressures. We'll continue to hold them as her blood pressure is still on the low-normal side, but better.  Chronic hepatitis C with cirrhosis.  Her ammonia level was mildly elevated on admission, but I doubt hepatic encephalopathy.  Continue lactulose and holding spironolactone.  Bipolar disorder.  Currently stable. We'll continue Risperdal. The patient has a lip tremor, possibly secondary to extrapyramidal manifestations of Risperdal. Will start low-dose Cogentin empirically.  Records per E- chart shows a history of hospice. Records from [facility] do not show this. Neither the patient nor her sister endorsed that she had ever been on hospice.   Time spent: 35 minutes.    [facility]  [facility] Pager [phone]. If 7PM-7AM, please contact night-coverage at [email or web address], password [facility] 05/10/2014, 12:26 PM  LOS: 1 day

## 2014-05-10 NOTE — Care Management Note (Addendum)
    Page 1 of 2   05/11/2014     11:45:38 AM CARE MANAGEMENT NOTE 05/11/2014  Patient:  Sabrina Carter,Sabrina   Account Number:  [id number]  Date Initiated:  05/10/2014  Documentation initiated by:  Sharrie RothmanBLACKWELL,TAMMY C  Subjective/Objective Assessment:   [facility] with hyperkalemia and weakness. Anticipate pt will discharge back to facility.     Action/Plan:   PT is recommending HH PT. CSW is aware and will arrange discharge to facility when medically stable. CM will arrange HH Pt prior to d/c.   Anticipated DC Date:  05/12/2014   Anticipated DC Plan:  ASSISTED LIVING / REST HOME  In-house referral  Clinical Social Worker      DC Associate Professorlanning Services  CM consult      [facility]   Choice offered to / List presented to:  C-1 Patient        HH arranged  HH-2 PT      HH agency  [facility]   Status of service:  Completed, signed off Medicare Important Message given?   (If response is "NO", the following Medicare IM given date fields will be blank) Date Medicare IM given:   Medicare IM given by:   Date Additional Medicare IM given:   Additional Medicare IM given by:    Discharge Disposition:  ASSISTED LIVING  Per UR Regulation:    If discussed at Long Length of Stay Meetings, dates discussed:    Comments:  05/11/2014 1145 Kathyrn SheriffJessica Childress, RN, MSN, CM [facility]. Pt will have HH PT through [facility]. Orders and face to face have been faxed. Facility aware of referral. No further CM needs.  05/10/14 1035 Arlyss Queenammy Blackwell, RN BSN CM PT [facility] per pt and facility choice. Referral called to [facility] and will fax orders once written. Will continue to follow for discharge planning needs.  05/10/14 1010 Arlyss Queenammy Blackwell, RN BSN CM

## 2014-05-11 ENCOUNTER — Encounter (HOSPITAL_COMMUNITY): Payer: Self-pay | Admitting: Internal Medicine

## 2014-05-11 DIAGNOSIS — T887XXS Unspecified adverse effect of drug or medicament, sequela: Secondary | ICD-10-CM

## 2014-05-11 DIAGNOSIS — B182 Chronic viral hepatitis C: Secondary | ICD-10-CM

## 2014-05-11 DIAGNOSIS — D696 Thrombocytopenia, unspecified: Secondary | ICD-10-CM

## 2014-05-11 LAB — CBC
HCT: 37.9 % (ref 36.0–46.0)
Hemoglobin: 12.7 g/dL (ref 12.0–15.0)
MCH: 29.4 pg (ref 26.0–34.0)
MCHC: 33.5 g/dL (ref 30.0–36.0)
MCV: 87.7 fL (ref 78.0–100.0)
Platelets: 123 10*3/uL — ABNORMAL LOW (ref 150–400)
RBC: 4.32 MIL/uL (ref 3.87–5.11)
RDW: 14.6 % (ref 11.5–15.5)
WBC: 7.4 10*3/uL (ref 4.0–10.5)

## 2014-05-11 LAB — BASIC METABOLIC PANEL
Anion gap: 7 (ref 5–15)
BUN: 17 mg/dL (ref 6–23)
CALCIUM: 9.1 mg/dL (ref 8.4–10.5)
CHLORIDE: 104 mmol/L (ref 96–112)
CO2: 25 mmol/L (ref 19–32)
Creatinine, Ser: 1.21 mg/dL — ABNORMAL HIGH (ref 0.50–1.10)
GFR calc Af Amer: 55 mL/min — ABNORMAL LOW (ref >90)
GFR calc non Af Amer: 48 mL/min — ABNORMAL LOW (ref >90)
Glucose, Bld: 128 mg/dL — ABNORMAL HIGH (ref 70–99)
Potassium: 4.5 mmol/L (ref 3.5–5.1)
Sodium: 136 mmol/L (ref 135–145)

## 2014-05-11 LAB — GLUCOSE, CAPILLARY
GLUCOSE-CAPILLARY: 119 mg/dL — AB (ref 70–99)
Glucose-Capillary: 204 mg/dL — ABNORMAL HIGH (ref 70–99)

## 2014-05-11 LAB — HEMOGLOBIN A1C
Hgb A1c MFr Bld: 7.2 % — ABNORMAL HIGH (ref 4.8–5.6)
Mean Plasma Glucose: 160 mg/dL

## 2014-05-11 MED ORDER — SPIRONOLACTONE 25 MG PO TABS: 25.0000 mg | ORAL_TABLET | Freq: Every day | ORAL | Status: AC

## 2014-05-11 MED ORDER — NORTRIPTYLINE HCL 25 MG PO CAPS: 25.0000 mg | ORAL_CAPSULE | Freq: Every day | ORAL | Status: AC

## 2014-05-11 MED ORDER — HYDROCHLOROTHIAZIDE 25 MG PO TABS: 12.5000 mg | ORAL_TABLET | Freq: Every day | ORAL | Status: AC

## 2014-05-11 MED ORDER — PREGABALIN 100 MG PO CAPS: 100.0000 mg | ORAL_CAPSULE | Freq: Three times a day (TID) | ORAL | Status: AC

## 2014-05-11 MED ORDER — BENZTROPINE MESYLATE 0.5 MG PO TABS: 0.5000 mg | ORAL_TABLET | Freq: Every day | ORAL | Status: AC

## 2014-05-11 MED ORDER — INSULIN LISPRO 100 UNIT/ML ~~LOC~~ SOLN: 30.0000 [IU] | Freq: Three times a day (TID) | SUBCUTANEOUS | Status: AC

## 2014-05-11 MED ORDER — DILTIAZEM HCL ER COATED BEADS 180 MG PO CP24
180.0000 mg | ORAL_CAPSULE | Freq: Every day | ORAL | Status: DC
  Administered 2014-05-11: 180 mg via ORAL
  Filled 2014-05-11: qty 1

## 2014-05-11 NOTE — Clinical Social Work Note (Signed)
[facility]. Pt and facility aware and agreeable. Pt states she has already notified her sister and does not want CSW to call her as she is at work. Facility to pick up pt this afternoon.  Derenda FennelKara Seamus [place], [place]CSW [phone]

## 2014-05-11 NOTE — Clinical Documentation Improvement (Signed)
Possible Clinical Conditions?  Hepatic Failure Other Condition Cannot Clinically Determine   Supporting Information: "Chronic hepatitis C with cirrhosis. Her ammonia level is mildly elevated, but I doubt hepatic encephalopathy. Continue lactulose." Risk Factors: Hepatitis C & Cirrhosis Diagnosis: Component     Latest Ref Rng 05/09/2014 05/09/2014 05/10/2014 05/11/2014         3:10 PM  4:15 PM  6:48 AM  8:22 AM  AST     0 - 37 U/L 132 (H)  128 (H)   ALT     0 - 35 U/L 111 (H)  107 (H)   Alkaline Phosphatase     39 - 117 U/L 116  109     Thank You, Nevin BloodgoodJoan B Leenah Seidner, RN, BSN, CCDS,Clinical Documentation Specialist:  [phone]  [phone]=[employer] [place]- Health Information Management

## 2014-05-11 NOTE — Discharge Summary (Signed)
Physician Discharge Summary  Verdene LennertBerthine Goldberg ZOX:[id number] DOB: [birth year] DOA: 05/09/2014  PCP: No primary care provider on file.  Admit date: 05/09/2014 Discharge date: 05/11/2014  Time spent: Greater than 30 minutes  Recommendations for Outpatient Follow-up:  1. Recommend rechecking the patient's serum potassium and renal function in 1 week. 2. Several medication changes were made due to the patient's generalized weakness. 3. Recommend checking patient's blood sugars 2-3 times daily. 4. Home health physical therapy was ordered. 5. Patient was discharged to cancel a house assisted living facility.   Discharge Diagnoses:  1. Generalized weakness with falls. Etiology likely multifactorial including hyperkalemia, dehydration, and polypharmacy. 2. Hyperkalemia secondary to spironolactone and lisinopril in the setting of acute kidney injury. 3. Medication side effect/toxicity from Lyrica and polypharmacy. 4. Acute kidney injury superimposed on stage II chronic kidney disease; secondary to prerenal azotemia in the setting of ACE inhibitor/diuretic therapy. 5. Hyponatremia secondary to hypovolemia. Resolved following IV fluid hydration. 6. Chronic essential hypertension with low-normal blood pressures during hospitalization. 7. Chronic hepatitis C with cirrhosis with no evidence of decompensated hepatic failure. 8. Chronic thrombocytopenia ankle July the secondary to hepatitis C cirrhosis. 9. Diabetes mellitus with neuropathy. 10. Bipolar disorder. 11. Mild extrapyramidal manifestations of risperidone. 12. Obesity.  Discharge Condition: Improved.  Diet recommendation: Carbohydrate modified/heart healthy.  Filed Weights   05/09/14 2040 05/10/14 0500 05/11/14 0649  Weight: 113.127 kg (249 lb 6.4 oz) 111.2 kg (245 lb 2.4 oz) 106.051 kg (233 lb 12.8 oz)    History of present illness:   Sabrina Carter is a 59 y.o. female with a history of bipolar disorder, hepatitis C cirrhosis,  diabetes mellitus, and chronic kidney disease, who presented to the emergency department with a chief complaint of generalized weakness and multiple falls at the assisted living facility-[facility]. The patient reported a few week history of generalized weakness with multiple falls at the assisted living facility. She denied any acute joint pain. She denied any head injury. She denied any loss of consciousness. She said that her legs have been weak and has been giving away when she attempted to stand. She denied unilateral weakness of her upper extremities or lower extremities. She denied difficulty swallowing or difficulty speaking. She denied headache. She has had a decline in her visual acuity which she attributes to previous cataract surgery. She denied any recent low blood sugars. She denied chest pain, fever, chills, upper respiratory infection symptoms, vomiting, or diarrhea. She has had some intermittent nausea. The dose of Lyrica was increased from 100 mg 3 times daily to 200 mg 3 times daily a few weeks ago. She believed that her generalized weakness and falls started after the increase.  In the ED, she was afebrile and hemodynamically stable, though her blood pressure was on the low-normal side. CT of her head revealed no acute abnormalities. Her chest x-ray revealed improved aeration with improvement in pulmonary vascular congestion. Her EKG revealed normal sinus rhythm-heart rate 70 bpm; borderline prolonged PR interval. Her lab data were significant for a serum sodium of 6.2, sodium of 129, creatinine 1.7, and ammonia level of 54. She was admitted for further evaluation and management.  Hospital Course:   Generalized weakness with falls.  The patient was started on vigorous IV fluid hydration. Potentially offending medications were either discontinued or the dose was decreased. Hyperkalemia was treated. For further evaluation, a number of studies were ordered.  MRI of her brain revealed  no acute intracranial findings. TSH and vitamin B12 levels were not deficient..Marland Kitchen  Etiology was likely multifactorial including hyperkalemia, dehydration, polypharmacy/medication side effect-namely an      increase in dosing of Lyrica in the setting of chronic treatment with nortriptyline, Risperdal, hydroxyzine, and tramadol.     She improved clinically and symptomatically. Physical therapy and occupational therapy evaluated the patient and recommended home therapy.  Hyperkalemia.  This was likely secondary to spironolactone and lisinopril in the setting of acute kidney injury.  The patient received insulin and dextrose and lactulose by the EDP in the emergency department.  She was started on IV fluid hydration with half-normal saline with bicarbonate added. She was given 1 dose of IV Lasix. Lisinopril and spironolactone were withheld.  Her serum potassium has improved. Lisinopril was not restarted at the time of discharge, but spironolactone was restarted but at only 25 mg daily rather than 100 mg daily. Recommend follow-up of her serum potassium in one week.  Acute kidney injury; superimposed on stage II chronic kidney disease.  The patient's baseline creatinine is 1.1-1.2 as of a few months ago. It is 1.7 on admission. This was likely prerenal azotemia with concomitant ACE inhibitor and diuretic therapy. She was started on IV fluids. Lisinopril and HCTZ were withheld. Her renal function improved to baseline.  Hyponatremia.  This was likely from hypovolemia. Following IV fluid hydration with normal saline, her serum sodium improved.  Thrombocytopenia and coagulopathy. Both were likely secondary to chronic hepatitis C cirrhosis.  Her thyroid function was within normal limits and her vitamin B12 level was not in the deficient range.   Insulin-dependent Diabetes with neuropathy. The patient's CBGs were within normal limits on admission.. She is treated with multiple medications including  insulin. These medications were temporarily held with exception of sliding scale NovoLog. Her hemoglobin A1c was 7.2. She was discharged on her normal home medication regimen with the exception of a decrease in her 3 times a day dosing of Humalog to 30 units 3 times a day. The dose of nortriptyline and Lyrica were decreased.  Hypertension. The patient is treated chronically with lisinopril, amlodipine, and ? Diltiazem. All of these medications were withheld on admission as she had hypovolemia and low-normal blood pressures. Her blood pressure improved and therefore diltiazem and hydrochlorothiazide were restarted-hydrochlorothiazide dose decreased to 12.5 mg daily. Amlodipine and lisinopril were discontinued. Her blood pressure was 123/74 at the time of discharge.  Chronic hepatitis C with cirrhosis. Her ammonia level was mildly elevated on admission, but I doubted hepatic encephalopathy. There was no evidence of hepatic failure. She was continued on lactulose. Spironolactone was withheld, but restarted upon discharge at a lower dose.   Bipolar disorder.  Currently stable. Risperdal was continued.  The patient had a lip tremor, possibly secondary to extrapyramidal manifestations of Risperdal. Low-dose Cogentinwas started  empirically.   Records per E- chart shows a history of hospice. Records from [facility] do not show this. Neither the patient nor her sister endorsed that she had ever been on hospice. This was likely a wrong entry in the patient's chart     Procedures:  2-D echocardiogram- Study Conclusions - Left ventricle: The cavity size was normal. Wall thickness was normal. Systolic function was vigorous. The estimated ejection fraction was in the range of 65% to 70%. Wall motion was normal; there were no regional wall motion abnormalities. Doppler parameters are consistent with abnormal left ventricular relaxation (grade 1 diastolic dysfunction). - Aortic valve:  Mildly calcified annulus. Trileaflet; mildly thickened leaflets. Valve area (VTI): 2.2 cm^2. Valve area (Vmax): 2.29 cm^2. -  Mitral valve: Mildly calcified annulus. Mildly thickened leaflets . - Technically adequate study.  Consultations:  None  Discharge Exam: Filed Vitals:   05/11/14 0649  BP: 123/74  Pulse: 101  Temp: 99.2 F (37.3 C)  Resp: 18    General: Obese African-American woman sitting up in bed. No acute distress. She looks much better.  Cardiovascular: S1, S2, with a 1 to 2/6 systolic murmur.  Respiratory: Breathing nonlabored. Lungs clear to auscultation bilateral.  Abdomen: Obese, positive bowel sounds, soft, nontender, nondistended.  Musculoskeletal/extremities: No acute hydrated joints. Lower extremity edema decreased down to trace of pedal edema.  Neurologic/psychiatric: She is alert and oriented 3. No evidence of dysphasia or dysarthria. Cranial nerves II through XII are intact; except that she does have a mild rotary lip tremor. The patient is able to raise each leg against gravity. Her handgrip is 5 over 5 bilaterally.  Discharge Instructions   Discharge Instructions    Diet - low sodium heart healthy    Complete by:  As directed      Diet Carb Modified    Complete by:  As directed      Discharge instructions    Complete by:  As directed   1.) There have been several medication changes. See medication list. 2.) Your blood sugar should be checked 2-3 times daily.     Increase activity slowly    Complete by:  As directed           Current Discharge Medication List    START taking these medications   Details  benztropine (COGENTIN) 0.5 MG tablet Take 1 tablet (0.5 mg total) by mouth daily. Qty: 30 tablet, Refills: 3      CONTINUE these medications which have CHANGED   Details  hydrochlorothiazide (HYDRODIURIL) 25 MG tablet Take 0.5 tablets (12.5 mg total) by mouth daily. Qty: 15 tablet, Refills: 3    insulin lispro (HUMALOG) 100  UNIT/ML injection Inject 0.3 mLs (30 Units total) into the skin 3 (three) times daily after meals. Qty: 10 mL, Refills: 11    nortriptyline (PAMELOR) 25 MG capsule Take 1 capsule (25 mg total) by mouth at bedtime. Qty: 30 capsule, Refills: 3    pregabalin (LYRICA) 100 MG capsule Take 1 capsule (100 mg total) by mouth 3 (three) times daily. Qty: 90 capsule, Refills: 3    spironolactone (ALDACTONE) 25 MG tablet Take 1 tablet (25 mg total) by mouth daily. Qty: 30 tablet, Refills: 3      CONTINUE these medications which have NOT CHANGED   Details  acetaminophen (TYLENOL) 500 MG tablet Take 500 mg by mouth every 4 (four) hours as needed for mild pain or fever.    albuterol (PROVENTIL) (2.5 MG/3ML) 0.083% nebulizer solution Take 2.5 mg by nebulization every 6 (six) hours as needed for wheezing or shortness of breath.    diltiazem (CARDIZEM CD) 240 MG 24 hr capsule Take 240 mg by mouth daily.    Exenatide ER 2 MG PEN Inject 2 mg into the skin every Saturday.    Fluticasone-Salmeterol (ADVAIR) 100-50 MCG/DOSE AEPB Inhale 1 puff into the lungs 2 (two) times daily.    hydrOXYzine (ATARAX/VISTARIL) 25 MG tablet Take 25 mg by mouth 3 (three) times daily as needed for itching.     insulin glargine (LANTUS) 100 UNIT/ML injection Inject 40 Units into the skin at bedtime.    lactulose (CHRONULAC) 10 GM/15ML solution Take 20 g by mouth 3 (three) times daily.    linagliptin (TRADJENTA) 5 MG  TABS tablet Take 5 mg by mouth daily.    Melatonin 3 MG TABS Take 3 mg by mouth at bedtime.    Psyllium (REGULOID) 48.57 % POWD Take 5 mLs by mouth every morning. 1 teaspoonful in 8 ounces of water every morning    risperiDONE (RISPERDAL) 1 MG tablet Take 1 mg by mouth 2 (two) times daily.    senna (SENOKOT) 8.6 MG TABS tablet Take 2 tablets by mouth daily.    Skin Protectants, Misc. (EUCERIN) cream Apply 1 application topically 2 (two) times daily as needed for dry skin.    traMADol (ULTRAM) 50 MG tablet  Take 50 mg by mouth every 6 (six) hours as needed for moderate pain.      STOP taking these medications     amLODipine (NORVASC) 10 MG tablet      lisinopril (PRINIVIL,ZESTRIL) 40 MG tablet        Allergies  Allergen Reactions  . Codeine    Follow-up Information    Follow up with [facility].   Contact information:   [address] [place] [address] [phone]       Please follow up.   Why:  Follow-up with your primary care provider Dr. Elesa Massed as scheduled next week.       The results of significant diagnostics from this hospitalization (including imaging, microbiology, ancillary and laboratory) are listed below for reference.    Significant Diagnostic Studies: Dg Chest 2 View  05/09/2014   CLINICAL DATA:  Breath, weakness  EXAM: CHEST  2 VIEW  COMPARISON:  Portable chest x-ray of 02/23/2014  FINDINGS: There has been improvement in aeration and in the mild pulmonary vascular congestion noted previously. No infiltrate or effusion is currently seen. Cardiomegaly is stable. No bony abnormality is seen.  IMPRESSION: Improved aeration with improvement in pulmonary vascular congestion.   Electronically Signed   By: Dwyane Dee M.D.   On: 05/09/2014 16:52   Ct Head Wo Contrast  05/09/2014   CLINICAL DATA:  Generalized weakness  EXAM: CT HEAD WITHOUT CONTRAST  TECHNIQUE: Contiguous axial images were obtained from the base of the skull through the vertex without intravenous contrast.  COMPARISON:  CT head 08/30/2013  FINDINGS: Ventricle size is normal.  Cerebral volume normal.  Negative for acute or chronic ischemia. Negative for hemorrhage or mass.  Calvarium intact.  IMPRESSION: Negative.  No change from the prior CT.   Electronically Signed   By: Marlan Palau M.D.   On: 05/09/2014 15:57   Mr Brain Wo Contrast  05/09/2014   CLINICAL DATA:  Generalized Weakness for 1 month. No known injury. RIGHT facial droop with dysarthria.  EXAM: MRI HEAD WITHOUT CONTRAST   TECHNIQUE: Multiplanar, multiecho pulse sequences of the brain and surrounding structures were obtained without intravenous contrast.  COMPARISON:  CT head earlier today.  FINDINGS: The patient was unable to remain motionless for the exam. Small or subtle lesions could be overlooked.  No evidence for acute infarction, hemorrhage, mass lesion, hydrocephalus, or extra-axial fluid. Mild cerebral and cerebellar atrophy. No significant white matter disease. Partial empty sella. No pineal or cerebellar tonsil abnormality.  Flow voids are preserved in the major intracranial vessels. No areas of chronic hemorrhage. There is a remote medial blowout fracture LEFT orbit. No sinus air-fluid levels. Negative mastoids. Good general agreement with prior CT.  IMPRESSION: No acute intracranial findings.  Chronic changes as described.   Electronically Signed   By: Davonna Belling M.D.   On:  05/09/2014 19:35    Microbiology: No results found for this or any previous visit (from the past 240 hour(s)).   Labs: Basic Metabolic Panel:  Recent Labs Lab 05/09/14 1510 05/09/14 1615 05/10/14 0648 05/11/14 0822  NA 129*  --  138 136  K 6.0* 6.2* 5.0 4.5  CL 102  --  107 104  CO2 22  --  26 25  GLUCOSE 188*  --  105* 128*  BUN 29*  --  20 17  CREATININE 1.70*  --  1.22* 1.21*  CALCIUM 8.7  --  8.9 9.1   Liver Function Tests:  Recent Labs Lab 05/09/14 1510 05/10/14 0648  AST 132* 128*  ALT 111* 107*  ALKPHOS 116 109  BILITOT 0.6 1.0  PROT 7.6 7.0  ALBUMIN 3.2* 2.9*   No results for input(s): LIPASE, AMYLASE in the last 168 hours.  Recent Labs Lab 05/09/14 1533  AMMONIA 54*   CBC:  Recent Labs Lab 05/09/14 1510 05/10/14 0648 05/11/14 0822  WBC 7.1 5.9 7.4  NEUTROABS 3.7  --   --   HGB 12.1 12.1 12.7  HCT 36.6 36.8 37.9  MCV 88.8 89.3 87.7  PLT 134* 115* 123*   Cardiac Enzymes: No results for input(s): CKTOTAL, CKMB, CKMBINDEX, TROPONINI in the last 168 hours. BNP: BNP (last 3 results) No  results for input(s): BNP in the last 8760 hours.  ProBNP (last 3 results) No results for input(s): PROBNP in the last 8760 hours.  CBG:  Recent Labs Lab 05/10/14 0755 05/10/14 1205 05/10/14 1655 05/10/14 2147 05/11/14 0732  GLUCAP 110* 129* 116* 125* 119*       Signed:  Evalee Gerard  [facility] 05/11/2014, 11:33 AM

## 2014-05-11 NOTE — Care Management Utilization Note (Signed)
UR completed 

## 2015-01-23 ENCOUNTER — Emergency Department (HOSPITAL_COMMUNITY): Payer: Medicare Other

## 2015-01-23 ENCOUNTER — Emergency Department (HOSPITAL_COMMUNITY)
Admission: EM | Admit: 2015-01-23 | Discharge: 2015-01-24 | Disposition: A | Discharge status: Home / Self Care | Payer: Medicare Other | Attending: Emergency Medicine | Admitting: Emergency Medicine

## 2015-01-23 ENCOUNTER — Encounter (HOSPITAL_COMMUNITY): Payer: Self-pay | Admitting: Emergency Medicine

## 2015-01-23 DIAGNOSIS — R51 Headache: Secondary | ICD-10-CM | POA: Diagnosis present

## 2015-01-23 DIAGNOSIS — N189 Chronic kidney disease, unspecified: Secondary | ICD-10-CM | POA: Diagnosis not present

## 2015-01-23 DIAGNOSIS — I129 Hypertensive chronic kidney disease with stage 1 through stage 4 chronic kidney disease, or unspecified chronic kidney disease: Secondary | ICD-10-CM | POA: Diagnosis not present

## 2015-01-23 DIAGNOSIS — Z794 Long term (current) use of insulin: Secondary | ICD-10-CM | POA: Diagnosis not present

## 2015-01-23 DIAGNOSIS — J449 Chronic obstructive pulmonary disease, unspecified: Secondary | ICD-10-CM | POA: Diagnosis not present

## 2015-01-23 DIAGNOSIS — Z7951 Long term (current) use of inhaled steroids: Secondary | ICD-10-CM | POA: Insufficient documentation

## 2015-01-23 DIAGNOSIS — B349 Viral infection, unspecified: Secondary | ICD-10-CM | POA: Diagnosis not present

## 2015-01-23 DIAGNOSIS — R000 Tachycardia, unspecified: Secondary | ICD-10-CM | POA: Insufficient documentation

## 2015-01-23 DIAGNOSIS — F319 Bipolar disorder, unspecified: Secondary | ICD-10-CM | POA: Insufficient documentation

## 2015-01-23 DIAGNOSIS — Z79899 Other long term (current) drug therapy: Secondary | ICD-10-CM | POA: Insufficient documentation

## 2015-01-23 DIAGNOSIS — E875 Hyperkalemia: Secondary | ICD-10-CM | POA: Diagnosis not present

## 2015-01-23 DIAGNOSIS — E119 Type 2 diabetes mellitus without complications: Secondary | ICD-10-CM | POA: Insufficient documentation

## 2015-01-23 DIAGNOSIS — Z7984 Long term (current) use of oral hypoglycemic drugs: Secondary | ICD-10-CM | POA: Insufficient documentation

## 2015-01-23 DIAGNOSIS — R519 Headache, unspecified: Secondary | ICD-10-CM

## 2015-01-23 LAB — COMPREHENSIVE METABOLIC PANEL
ALBUMIN: 3 g/dL — AB (ref 3.5–5.0)
ALT: 40 U/L (ref 14–54)
ANION GAP: 9 (ref 5–15)
AST: 36 U/L (ref 15–41)
Alkaline Phosphatase: 139 U/L — ABNORMAL HIGH (ref 38–126)
BILIRUBIN TOTAL: 0.7 mg/dL (ref 0.3–1.2)
BUN: 6 mg/dL (ref 6–20)
CALCIUM: 8.3 mg/dL — AB (ref 8.9–10.3)
CO2: 26 mmol/L (ref 22–32)
Chloride: 104 mmol/L (ref 101–111)
Creatinine, Ser: 0.9 mg/dL (ref 0.44–1.00)
GFR calc Af Amer: >60 mL/min (ref >60)
GFR calc non Af Amer: >60 mL/min (ref >60)
Glucose, Bld: 223 mg/dL — ABNORMAL HIGH (ref 65–99)
Potassium: 3.5 mmol/L (ref 3.5–5.1)
SODIUM: 139 mmol/L (ref 135–145)
TOTAL PROTEIN: 6.2 g/dL — AB (ref 6.5–8.1)

## 2015-01-23 LAB — CBC WITH DIFFERENTIAL/PLATELET
BASOS ABS: 0 10*3/uL (ref 0.0–0.1)
BASOS PCT: 0 %
Eosinophils Absolute: 0 10*3/uL (ref 0.0–0.7)
Eosinophils Relative: 0 %
HEMATOCRIT: 38.5 % (ref 36.0–46.0)
HEMOGLOBIN: 12.7 g/dL (ref 12.0–15.0)
Lymphocytes Relative: 9 %
Lymphs Abs: 0.9 10*3/uL (ref 0.7–4.0)
MCH: 28.8 pg (ref 26.0–34.0)
MCHC: 33 g/dL (ref 30.0–36.0)
MCV: 87.3 fL (ref 78.0–100.0)
Monocytes Absolute: 1.8 10*3/uL — ABNORMAL HIGH (ref 0.1–1.0)
Monocytes Relative: 18 %
NEUTROS ABS: 7.2 10*3/uL (ref 1.7–7.7)
Neutrophils Relative %: 73 %
Platelets: 123 10*3/uL — ABNORMAL LOW (ref 150–400)
RBC: 4.41 MIL/uL (ref 3.87–5.11)
RDW: 15.6 % — ABNORMAL HIGH (ref 11.5–15.5)
WBC: 9.9 10*3/uL (ref 4.0–10.5)

## 2015-01-23 MED ORDER — FENTANYL CITRATE (PF) 100 MCG/2ML IJ SOLN
50.0000 ug | Freq: Once | INTRAMUSCULAR | Status: AC
  Administered 2015-01-23: 50 ug via INTRAVENOUS
  Filled 2015-01-23: qty 2

## 2015-01-23 MED ORDER — PROCHLORPERAZINE EDISYLATE 5 MG/ML IJ SOLN
10.0000 mg | Freq: Once | INTRAMUSCULAR | Status: AC
  Administered 2015-01-23: 10 mg via INTRAVENOUS
  Filled 2015-01-23: qty 2

## 2015-01-23 MED ORDER — ACETAMINOPHEN 500 MG PO TABS
1000.0000 mg | ORAL_TABLET | Freq: Once | ORAL | Status: AC
  Administered 2015-01-23: 1000 mg via ORAL
  Filled 2015-01-23: qty 2

## 2015-01-23 NOTE — ED Provider Notes (Signed)
CSN: [id number]     Arrival date & time 01/23/15  2144 History   First MD Initiated Contact with Patient 01/23/15 2149     Chief Complaint  Patient presents with  . Headache     (Consider location/radiation/quality/duration/timing/severity/associated sxs/prior Treatment) HPI Comments: Patient is a 59 year old female resident of the [facility] nursing facility who presents to the emergency department by EMS because of problem with headache. The patient states she's been having problems with headache over the last 3 days. The headache seems to be getting worse on instead of better. The patient states that the headache is mostly behind her eyes. She denies any blurred vision or double vision, only headache. She states at times she can "hear her heartbeat". His been no injury to the head. She's not had any operations or procedures involving her head or spine area. The patient states she has had a fever and chills. There's been no vomiting, but some nausea. There's been no unusual rash present. The patient states that the only medication changes she's had was a medication at was for psoriasis that has been stopped.  Patient is a 59 y.o. female presenting with headaches. The history is provided by the patient.  Headache Associated symptoms: fatigue, fever and nausea   Associated symptoms: no seizures, no vomiting and no weakness     Past Medical History  Diagnosis Date  . Altered mental status   . Hypertension   . Hyperlipidemia   . Diabetes mellitus without complication (HCC)   . COPD (chronic obstructive pulmonary disease) (HCC)   . Hepatitis C   . Bipolar disorder (HCC)   . CKD (chronic kidney disease)     chronic kidney failure  . Medication side effect 05/09/2014    High dose Lyrica  . Hyperkalemia 05/09/2014    Sec to Aldactone & Lisinopril   Past Surgical History  Procedure Laterality Date  . Cataract surgery.     History reviewed. No pertinent family history. Social History   Substance Use Topics  . Smoking status: Never Smoker   . Smokeless tobacco: None  . Alcohol Use: No   OB History    No data available     Review of Systems  Constitutional: Positive for fever, chills and fatigue.  Gastrointestinal: Positive for nausea. Negative for vomiting.  Neurological: Positive for headaches. Negative for seizures, syncope, speech difficulty, weakness and light-headedness.      Allergies  Codeine  Home Medications   Prior to Admission medications   Medication Sig Start Date End Date Taking? Authorizing Provider  acetaminophen (TYLENOL) 500 MG tablet Take 500 mg by mouth every 4 (four) hours as needed for mild pain or fever.    Historical Provider, MD  albuterol (PROVENTIL) (2.5 MG/3ML) 0.083% nebulizer solution Take 2.5 mg by nebulization every 6 (six) hours as needed for wheezing or shortness of breath.    Historical Provider, MD  benztropine (COGENTIN) 0.5 MG tablet Take 1 tablet (0.5 mg total) by mouth daily. 05/11/14   Elliot Cousin, MD  diltiazem (CARDIZEM CD) 240 MG 24 hr capsule Take 240 mg by mouth daily.    Historical Provider, MD  Exenatide ER 2 MG PEN Inject 2 mg into the skin every Saturday.    Historical Provider, MD  Fluticasone-Salmeterol (ADVAIR) 100-50 MCG/DOSE AEPB Inhale 1 puff into the lungs 2 (two) times daily.    Historical Provider, MD  hydrochlorothiazide (HYDRODIURIL) 25 MG tablet Take 0.5 tablets (12.5 mg total) by mouth daily. 05/11/14   Angelique Blonder  Fisher, MD  hydrOXYzine (ATARAX/VISTARIL) 25 MG tablet Take 25 mg by mouth 3 (three) times daily as needed for itching.     Historical Provider, MD  insulin glargine (LANTUS) 100 UNIT/ML injection Inject 40 Units into the skin at bedtime.    Historical Provider, MD  insulin lispro (HUMALOG) 100 UNIT/ML injection Inject 0.3 mLs (30 Units total) into the skin 3 (three) times daily after meals. 05/11/14   Elliot Cousinenise Fisher, MD  lactulose (CHRONULAC) 10 GM/15ML solution Take 20 g by mouth 3 (three) times  daily.    Historical Provider, MD  linagliptin (TRADJENTA) 5 MG TABS tablet Take 5 mg by mouth daily.    Historical Provider, MD  Melatonin 3 MG TABS Take 3 mg by mouth at bedtime.    Historical Provider, MD  nortriptyline (PAMELOR) 25 MG capsule Take 1 capsule (25 mg total) by mouth at bedtime. 05/11/14   Elliot Cousinenise Fisher, MD  pregabalin (LYRICA) 100 MG capsule Take 1 capsule (100 mg total) by mouth 3 (three) times daily. 05/11/14   Elliot Cousinenise Fisher, MD  Psyllium (REGULOID) 48.57 % POWD Take 5 mLs by mouth every morning. 1 teaspoonful in 8 ounces of water every morning    Historical Provider, MD  risperiDONE (RISPERDAL) 1 MG tablet Take 1 mg by mouth 2 (two) times daily.    Historical Provider, MD  senna (SENOKOT) 8.6 MG TABS tablet Take 2 tablets by mouth daily.    Historical Provider, MD  Skin Protectants, Misc. (EUCERIN) cream Apply 1 application topically 2 (two) times daily as needed for dry skin.    Historical Provider, MD  spironolactone (ALDACTONE) 25 MG tablet Take 1 tablet (25 mg total) by mouth daily. 05/11/14   Elliot Cousinenise Fisher, MD  traMADol (ULTRAM) 50 MG tablet Take 50 mg by mouth every 6 (six) hours as needed for moderate pain.    Historical Provider, MD   BP 164/92 mmHg  Pulse 117  Temp(Src) 100.5 F (38.1 C) (Oral)  Resp 14  Ht 5\' 7"  (1.702 m)  Wt 106.595 kg  BMI 36.80 kg/m2  SpO2 93% Physical Exam  Constitutional: She is oriented to person, place, and time. She appears well-developed and well-nourished.  Non-toxic appearance.  HENT:  Head: Normocephalic.  Right Ear: Tympanic membrane and external ear normal.  Left Ear: Tympanic membrane and external ear normal.  Eyes: EOM and lids are normal. Pupils are equal, round, and reactive to light.  Neck: Normal range of motion. Neck supple. Carotid bruit is not present.  Cardiovascular: Regular rhythm, normal heart sounds, intact distal pulses and normal pulses.  Tachycardia present.   Pulmonary/Chest: Breath sounds normal. No respiratory  distress.  Abdominal: Soft. Bowel sounds are normal. There is no tenderness. There is no guarding.  Musculoskeletal: Normal range of motion.  Lymphadenopathy:       Head (right side): No submandibular adenopathy present.       Head (left side): No submandibular adenopathy present.    She has no cervical adenopathy.  Neurological: She is alert and oriented to person, place, and time. She has normal strength. No cranial nerve deficit or sensory deficit. She exhibits normal muscle tone. Coordination normal.  Speech is clear and understandable. Grip is symmetrical. No gross motor or sensory deficits appreciated on examination.  Skin: Skin is warm and dry. No rash noted.  Psychiatric: She has a normal mood and affect. Her speech is normal.  Nursing note and vitals reviewed.   ED Course  Procedures (including critical care time) Labs Review  Labs Reviewed  URINALYSIS, ROUTINE W REFLEX MICROSCOPIC (NOT AT [facility])  COMPREHENSIVE METABOLIC PANEL  CBC WITH DIFFERENTIAL/PLATELET    Imaging Review No results found. I have personally reviewed and evaluated these images and lab results as part of my medical decision-making.   EKG Interpretation None      MDM Temperature is elevated at 100.5, pulse rate is elevated at 117. The pulse oximetry is 93% on room air. Blood pressure slightly elevated at 164/92.  11:25pm Pt reports headache is 0/10. The components of metabolic panel is nonacute. The complete blood count is within normal limits. The chest x-ray shows no acute cardiopulmonary disease, and the CT head scan shows no acute intracranial abnormalities on.  Suspect the patient has an upper respiratory infection that is driving problems with headache. Patient can be discharged back to the [facility] nursing facility. Patient advised to use Tylenol every 4 hours, for fever and chills. Increase fluids, hand washing precautions have been given. Patient to be reevaluated by her [facility]   physician.    Final diagnoses:  None    *I have reviewed nursing notes, vital signs, and all appropriate lab and imaging results for this patient.Ivery Quale, PA-C 01/23/15 1610  Eber Hong, MD 01/27/15 [phone]

## 2015-01-23 NOTE — Discharge Instructions (Signed)
Your lab work is nonacute. Your chest x-ray is negative for pneumonia or other acute problems. The CT scan of your head is negative for any acute bleed, mass, infarction, or other problem. Your examination suggest a viral illness, with exacerbation of a headache. Please see your as well house physician for recheck and evaluation. Viral Infections A virus is a type of germ. Viruses can cause:  Minor sore throats.  Aches and pains.  Headaches.  Runny nose.  Rashes.  Watery eyes.  Tiredness.  Coughs.  Loss of appetite.  Feeling sick to your stomach (nausea).  Throwing up (vomiting).  Watery poop (diarrhea). HOME CARE   Only take medicines as told by your doctor.  Drink enough water and fluids to keep your pee (urine) clear or pale yellow. Sports drinks are a good choice.  Get plenty of rest and eat healthy. Soups and broths with crackers or rice are fine. GET HELP RIGHT AWAY IF:   You have a very bad headache.  You have shortness of breath.  You have chest pain or neck pain.  You have an unusual rash.  You cannot stop throwing up.  You have watery poop that does not stop.  You cannot keep fluids down.  You or your child has a temperature by mouth above 102 F (38.9 C), not controlled by medicine.  Your baby is older than 3 months with a rectal temperature of 102 F (38.9 C) or higher.  Your baby is 653 months old or younger with a rectal temperature of 100.4 F (38 C) or higher. MAKE SURE YOU:   Understand these instructions.  Will watch this condition.  Will get help right away if you are not doing well or get worse.   This information is not intended to replace advice given to you by your health care provider. Make sure you discuss any questions you have with your health care provider.   Document Released: 01/09/2008 Document Revised: 04/20/2011 Document Reviewed: 07/04/2014 Elsevier Interactive Patient Education [employer]

## 2015-01-23 NOTE — ED Notes (Signed)
Per [facility] patient was seen [facility] for [facility] complaint, no findings, facility wasn't happy with her diagnosis, and sent her to [facility].

## 2015-01-23 NOTE — ED Notes (Signed)
Gave report of patient's discharge to [place] [facility], [facility] will transport to facility.

## 2015-01-24 NOTE — ED Notes (Signed)
Patient was [facility] back [facility], vital signs stable, no distress noted.

## 2016-03-17 IMAGING — CR DG CHEST 1V PORT
1 series · 1 of 1 positions shown · non-contrast
Comparison: PA and lateral chest 03/01/2013 and 08/29/2013.

CLINICAL DATA: Cough, wheezing and shortness of breath.

EXAM:
PORTABLE CHEST - 1 VIEW

[portable]
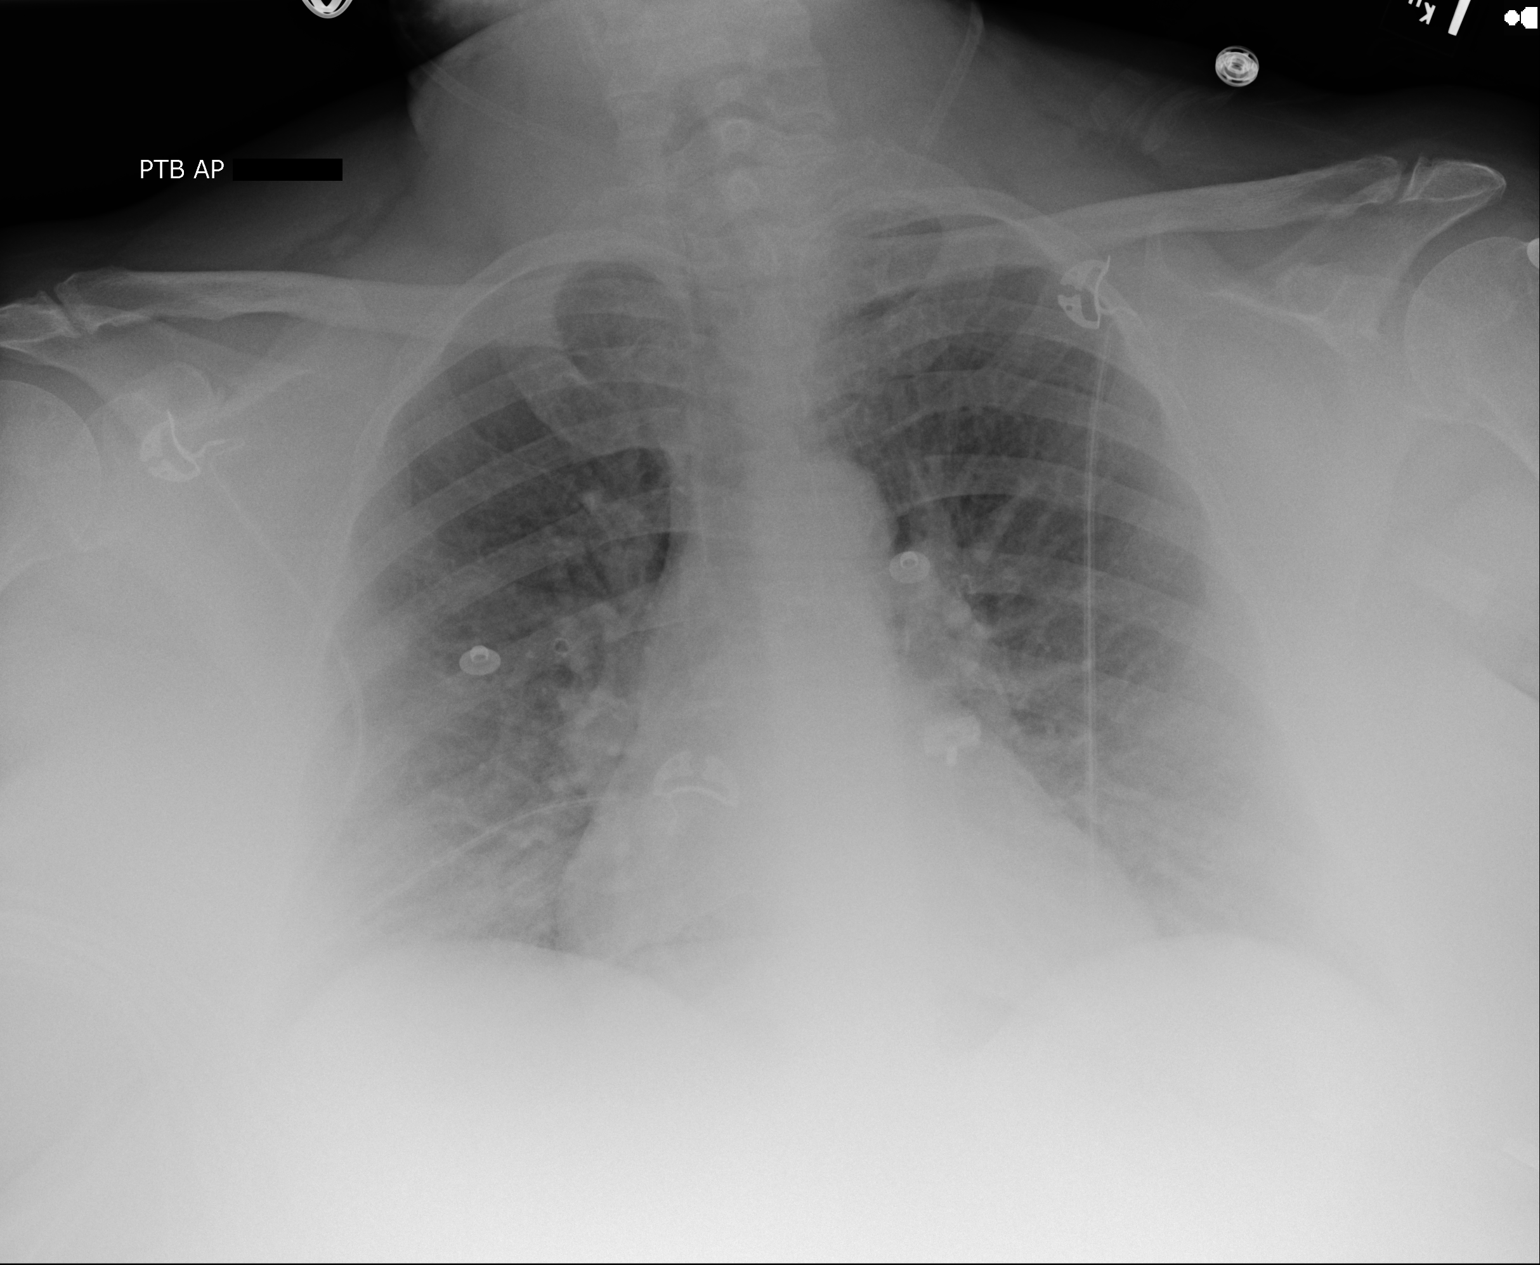

[1 of 1 positions shown; findings below may reference images not displayed]

FINDINGS: There is cardiomegaly and mild interstitial edema. No consolidative
process, pneumothorax or effusion.
IMPRESSION: Cardiomegaly and mild interstitial edema.

## 2016-10-15 ENCOUNTER — Inpatient Hospital Stay
Admission: EM | Admit: 2016-10-15 | Discharge: 2016-10-17 | Disposition: A | Discharge status: Home Health | Payer: MEDICARE | Source: Intra-hospital

## 2016-10-15 ENCOUNTER — Inpatient Hospital Stay
Admission: EM | Admit: 2016-10-15 | Discharge: 2016-10-17 | Disposition: A | Discharge status: Home Health | Payer: MEDICARE | Source: Intra-hospital | Attending: Family | Admitting: Family

## 2016-10-15 ENCOUNTER — Inpatient Hospital Stay
Admission: EM | Admit: 2016-10-15 | Discharge: 2016-10-17 | Disposition: A | Discharge status: Home Health | Payer: MEDICARE | Source: Intra-hospital | Attending: Internal Medicine | Admitting: Internal Medicine

## 2016-10-15 DIAGNOSIS — K7460 Unspecified cirrhosis of liver: Principal | ICD-10-CM

## 2016-10-15 DIAGNOSIS — K7290 Hepatic failure, unspecified without coma: Principal | ICD-10-CM

## 2016-10-15 DIAGNOSIS — R319 Hematuria, unspecified: Secondary | ICD-10-CM

## 2016-10-15 DIAGNOSIS — G9340 Encephalopathy, unspecified: Secondary | ICD-10-CM

## 2016-10-15 MED ORDER — RIFAXIMIN 550 MG TABLET
ORAL_TABLET | Freq: Two times a day (BID) | ORAL | 6 refills | 0 days | Status: SS
Start: 2016-10-15 — End: 2016-10-16

## 2016-10-16 DIAGNOSIS — K7290 Hepatic failure, unspecified without coma: Principal | ICD-10-CM

## 2016-10-17 DIAGNOSIS — K7290 Hepatic failure, unspecified without coma: Principal | ICD-10-CM

## 2016-10-17 MED ORDER — HYDROXYZINE HCL 25 MG TABLET
Freq: Once | ORAL | 0 refills | 0.00000 days | Status: SS | PRN
Start: 2016-10-17 — End: 2017-05-12

## 2016-10-17 MED ORDER — INSULIN GLARGINE (U-100) 100 UNIT/ML SUBCUTANEOUS SOLUTION
0 refills | 0.00000 days | Status: SS
Start: 2016-10-17 — End: 2017-05-12

## 2016-10-17 MED ORDER — AMLODIPINE 10 MG TABLET
ORAL_TABLET | Freq: Every day | ORAL | 11 refills | 0.00000 days | Status: SS
Start: 2016-10-17 — End: 2018-10-08

## 2016-10-17 MED ORDER — LACTULOSE 10 GRAM/15 ML ORAL SOLUTION
ORAL | 0 refills | 0.00000 days | Status: CP
Start: 2016-10-17 — End: 2017-09-28

## 2016-10-17 MED ORDER — PREGABALIN 150 MG CAPSULE
ORAL_CAPSULE | Freq: Two times a day (BID) | ORAL | 0 refills | 0.00000 days | Status: SS
Start: 2016-10-17 — End: 2017-05-12

## 2016-10-18 MED ORDER — SULFAMETHOXAZOLE 800 MG-TRIMETHOPRIM 160 MG TABLET
ORAL_TABLET | Freq: Two times a day (BID) | ORAL | 0 refills | 0.00000 days | Status: CP
Start: 2016-10-18 — End: 2016-10-21

## 2017-02-14 IMAGING — CT CT HEAD W/O CM
1 series · 16 of 29 positions shown, 20 images · non-contrast
Comparison: MRI brain 05/09/2014.  CT head 05/09/2014.

CLINICAL DATA: Left-sided headaches and fever since this morning.

EXAM:
CT HEAD WITHOUT CONTRAST
TECHNIQUE: Contiguous axial images were obtained from the base of the skull
through the vertex without intravenous contrast.

[Series 2: headtrauma 4.8 h37s · axial · 0.47mm/px · z∈[+86,+220]mm · 16 of 29 slices shown, 20 images]
[im 2/29  brain]
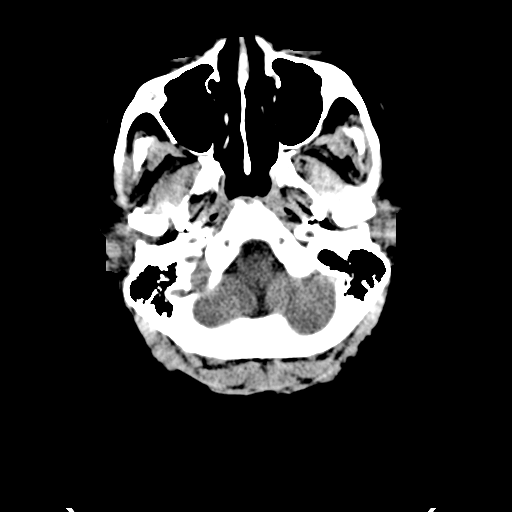
[im 2/29  bone]
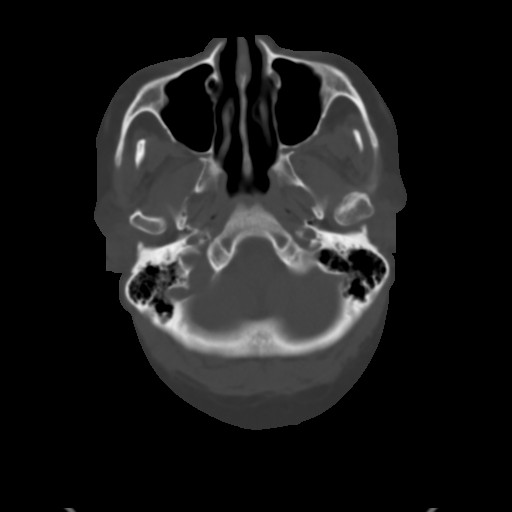
[im 4/29  brain]
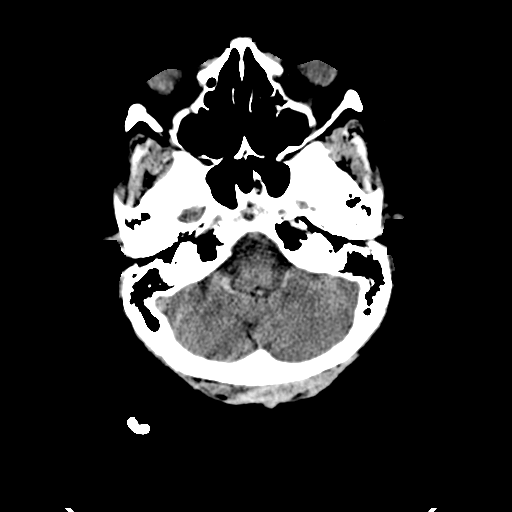
[im 6/29  brain]
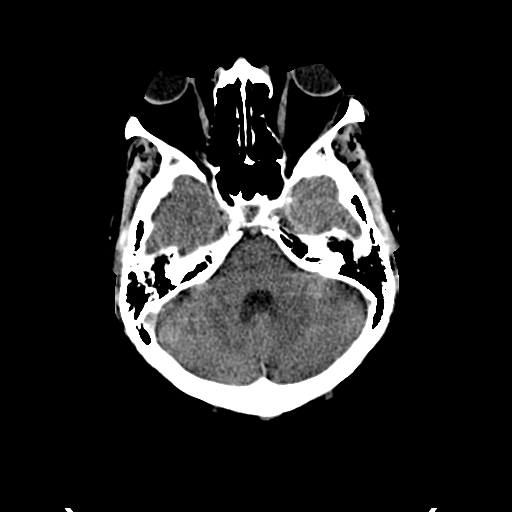
[im 7/29  brain]
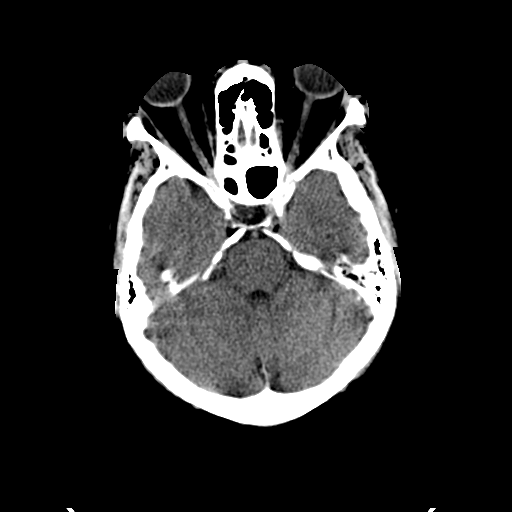
[im 9/29  brain]
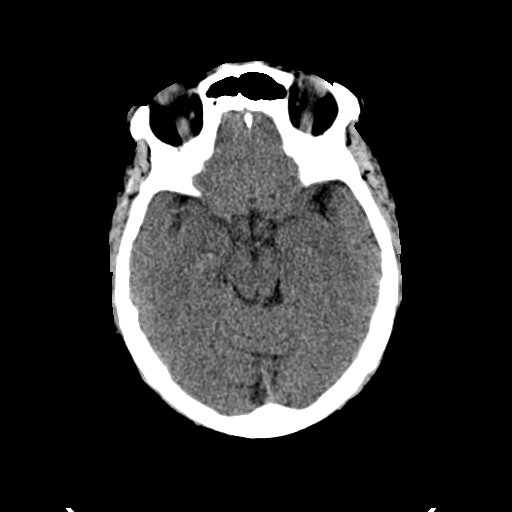
[im 9/29  bone]
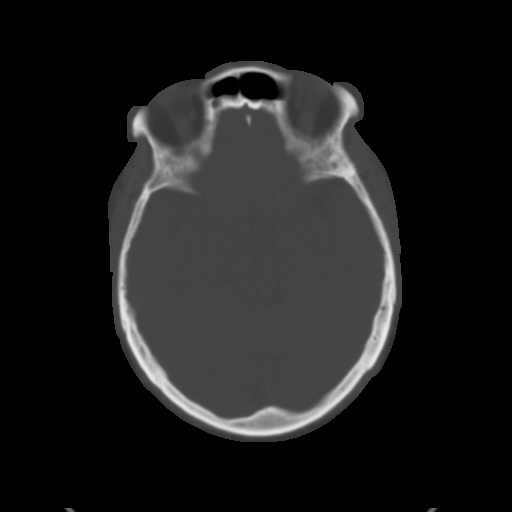
[im 11/29  brain]
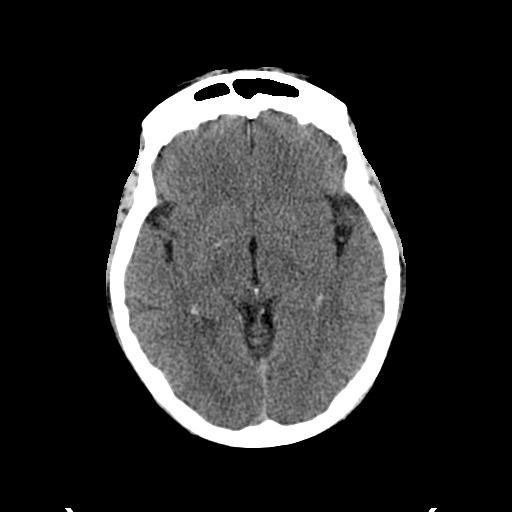
[im 12/29  brain]
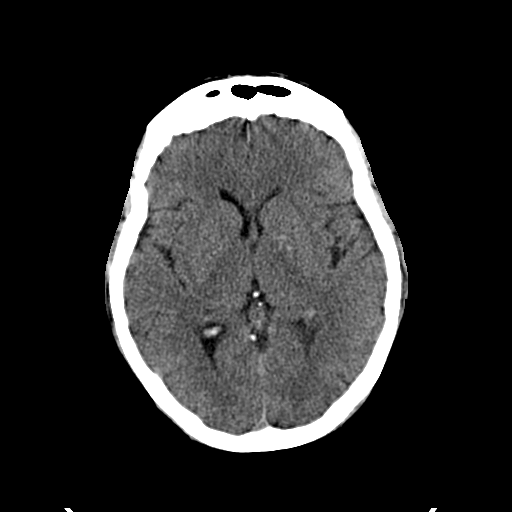
[im 14/29  brain]
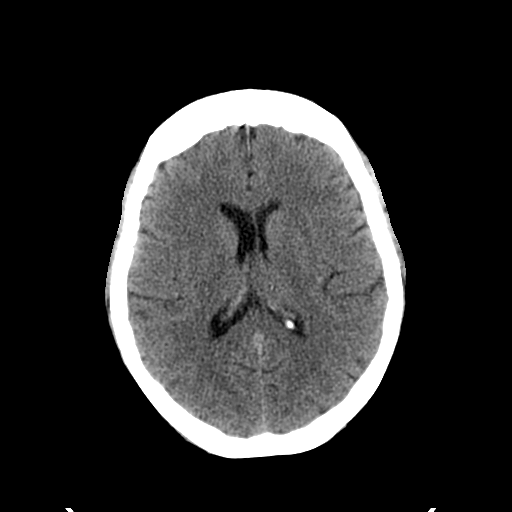
[im 16/29  brain]
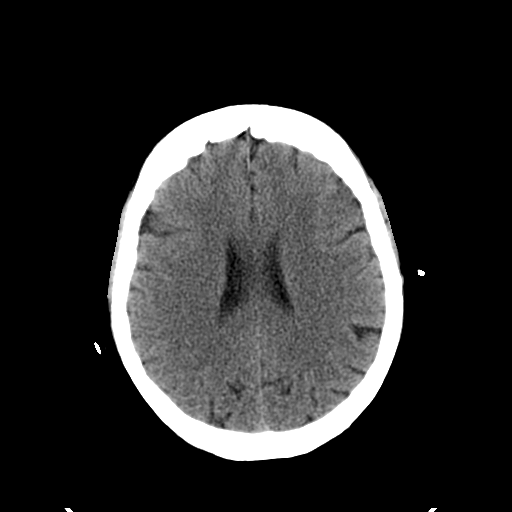
[im 16/29  bone]
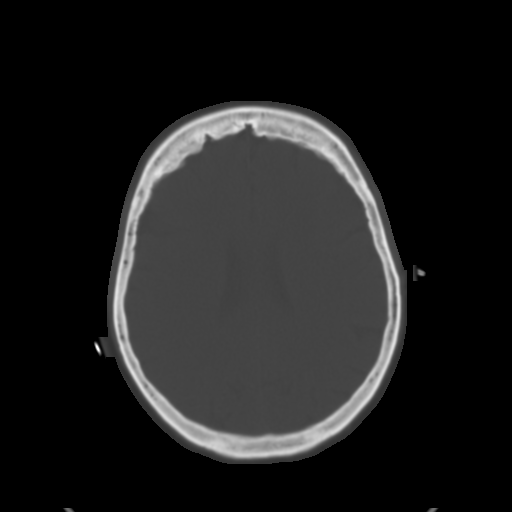
[im 18/29  brain]
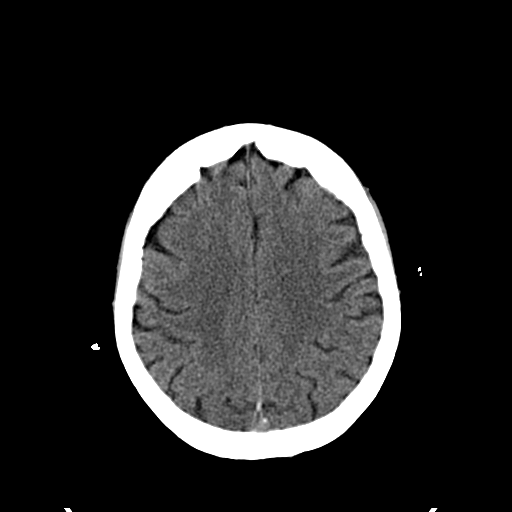
[im 19/29  brain]
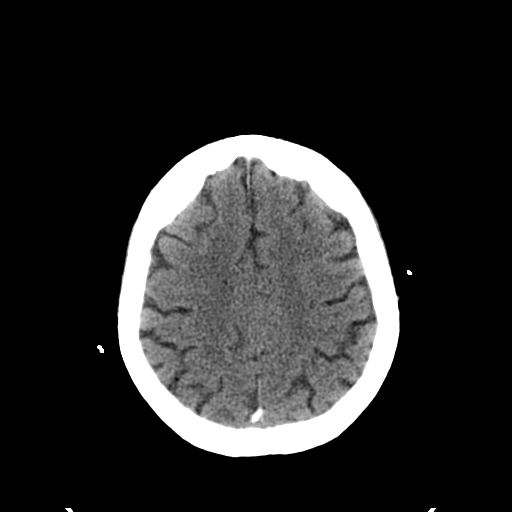
[im 21/29  brain]
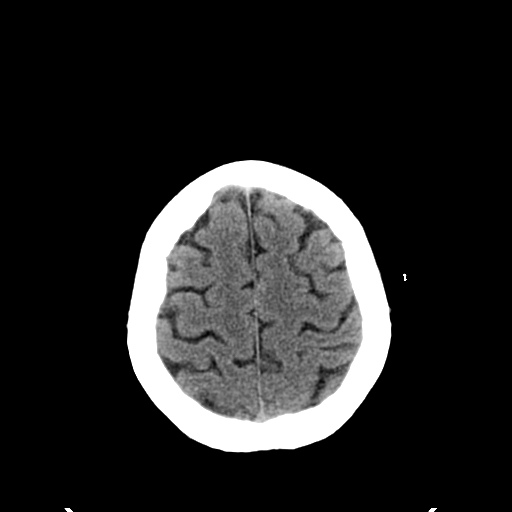
[im 23/29  brain]
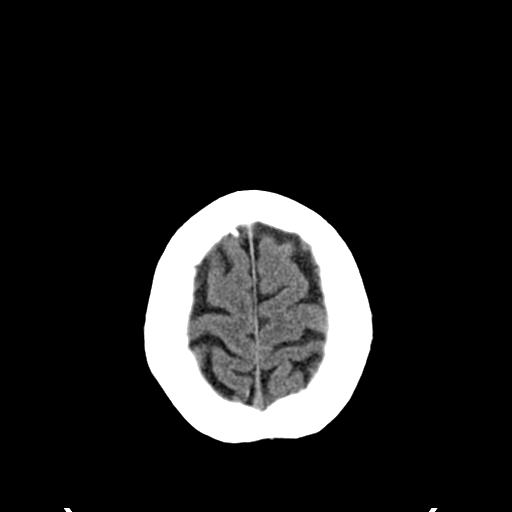
[im 23/29  bone]
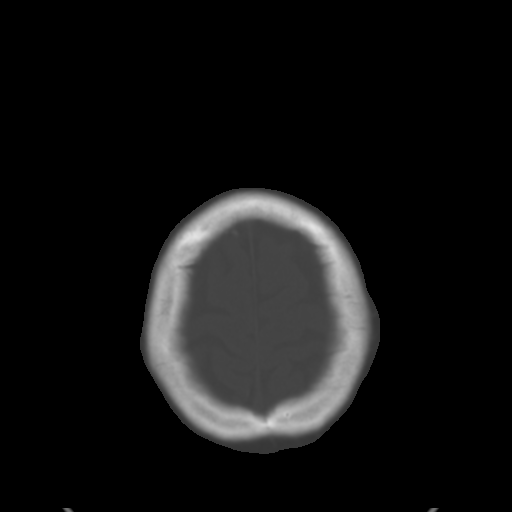
[im 24/29  brain]
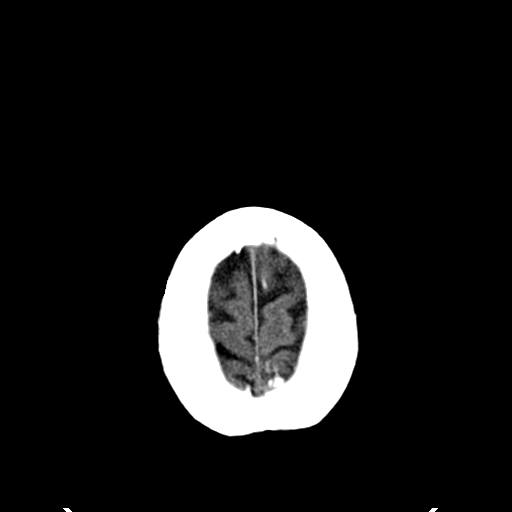
[im 26/29  brain]
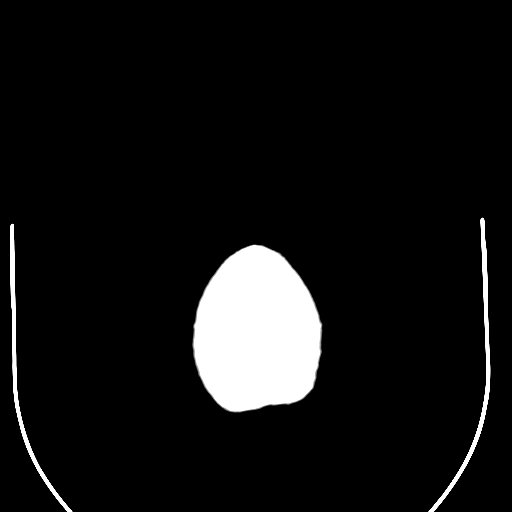
[im 28/29  brain]
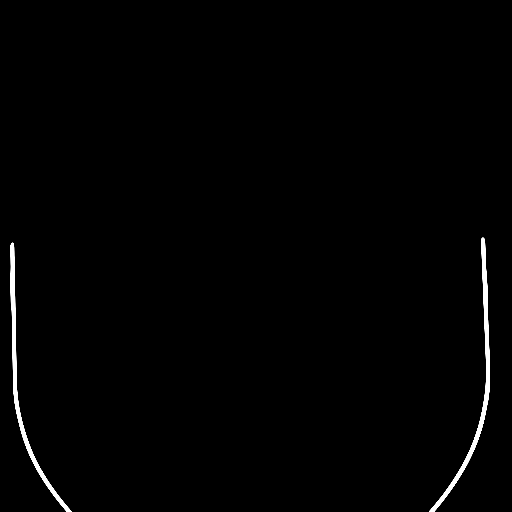

[16 of 29 positions shown; findings below may reference images not displayed]

FINDINGS: Ventricles and sulci appear symmetrical and are normal for patient's
age. No ventricular dilatation. No mass effect or midline shift. No
abnormal extra-axial fluid collections. Gray-white matter junctions
are distinct. Basal cisterns are not effaced. No evidence of acute
intracranial hemorrhage. No depressed skull fractures. Mild mucosal
thickening in the maxillary antra. No acute air-fluid levels in the
paranasal sinuses. Mastoid air cells are not opacified. Old left
medial orbital wall fracture.
IMPRESSION: No acute intracranial abnormalities.

## 2017-04-15 ENCOUNTER — Encounter: Admit: 2017-04-15 | Discharge: 2017-04-15 | Payer: MEDICARE | Attending: Family | Primary: Family

## 2017-04-15 ENCOUNTER — Encounter: Admit: 2017-04-15 | Discharge: 2017-04-15 | Payer: MEDICARE

## 2017-04-15 DIAGNOSIS — G9349 Other encephalopathy: Secondary | ICD-10-CM

## 2017-04-15 DIAGNOSIS — R188 Other ascites: Secondary | ICD-10-CM

## 2017-04-15 DIAGNOSIS — K7460 Unspecified cirrhosis of liver: Principal | ICD-10-CM

## 2017-04-15 MED ORDER — RIFAXIMIN 550 MG TABLET
ORAL_TABLET | Freq: Two times a day (BID) | ORAL | 6 refills | 0 days | Status: CP
Start: 2017-04-15 — End: 2017-05-04

## 2017-05-04 MED ORDER — RIFAXIMIN 550 MG TABLET: 550 mg | tablet | Freq: Two times a day (BID) | 5 refills | 0 days | Status: AC

## 2017-05-04 MED ORDER — RIFAXIMIN 550 MG TABLET
ORAL_TABLET | Freq: Two times a day (BID) | ORAL | 5 refills | 0.00000 days | Status: CP
Start: 2017-05-04 — End: 2018-05-04

## 2017-05-11 ENCOUNTER — Encounter: Admit: 2017-05-11 | Discharge: 2017-05-12 | Payer: MEDICARE

## 2017-05-11 DIAGNOSIS — R109 Unspecified abdominal pain: Principal | ICD-10-CM
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
## 2017-05-12 MED ORDER — INSULIN GLARGINE (U-100) 100 UNIT/ML SUBCUTANEOUS SOLUTION
0 days
Start: 2017-05-12 — End: 2018-10-08

## 2017-05-12 MED ORDER — VORTIOXETINE 10 MG TABLET
Freq: Every day | ORAL | 0 days
Start: 2017-05-12 — End: 2018-06-15

## 2017-05-12 MED ORDER — RISPERIDONE 2 MG TABLET
Freq: Every evening | ORAL | 0 days
Start: 2017-05-12 — End: 2018-10-06

## 2017-05-12 MED ORDER — PREGABALIN 150 MG CAPSULE
Freq: Two times a day (BID) | ORAL | 0.00000 days
Start: 2017-05-12 — End: 2018-06-15

## 2017-05-12 MED ORDER — METFORMIN 500 MG TABLET
Freq: Two times a day (BID) | ORAL | 0.00000 days
Start: 2017-05-12 — End: ?

## 2017-05-12 MED ORDER — SENNOSIDES 8.6 MG TABLET
Freq: Every evening | ORAL | 0 days | PRN
Start: 2017-05-12 — End: ?

## 2017-05-12 MED ORDER — METOPROLOL TARTRATE 100 MG TABLET
ORAL_TABLET | Freq: Two times a day (BID) | ORAL | 0 refills | 0 days | Status: SS
Start: 2017-05-12 — End: 2018-10-08

## 2017-05-12 MED ORDER — POTASSIUM CHLORIDE ER 10 MEQ TABLET, EXTENDED RELEASE WRAPPER
Freq: Two times a day (BID) | ORAL | 0 days
Start: 2017-05-12 — End: 2018-06-15
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
## 2017-05-13 MED ORDER — ASPIRIN 81 MG CHEWABLE TABLET
ORAL_TABLET | Freq: Every day | ORAL | 0 refills | 0.00000 days | Status: SS
Start: 2017-05-13 — End: 2018-10-08

## 2017-05-14 NOTE — Unmapped (Signed)
[facility] Specialty Medication Referral: No PA required    Medication (Brand/Generic): XIFAXAN    Initial FSI Test Claim completed with resulted information below:  No PA required  Patient ABLE to fill at [facility]  Insurance Company:  [facility]  Anticipated Copay: $0    As Co-pay is under $100 defined limit, per policy there will be no further investigation of need for financial assistance at this time unless patient requests. This referral has been communicated to the provider and handed off to the [facility] team for further processing and filling of prescribed medication.   ______________________________________________________________________  Please utilize this referral for viewing purposes as it will serve as the central location for all relevant documentation and updates.

## 2017-05-20 ENCOUNTER — Ambulatory Visit: Admit: 2017-05-20 | Discharge: 2017-05-21 | Payer: MEDICARE

## 2017-05-20 LAB — COMPREHENSIVE METABOLIC PANEL
ALBUMIN: 3.6 g/dL (ref 3.5–5.0)
ALKALINE PHOSPHATASE: 103 U/L (ref 38–126)
ALT (SGPT): 30 U/L (ref 15–48)
ANION GAP: 8 mmol/L — ABNORMAL LOW (ref 9–15)
AST (SGOT): 40 U/L — ABNORMAL HIGH (ref 14–38)
BILIRUBIN TOTAL: 0.6 mg/dL (ref 0.0–1.2)
BLOOD UREA NITROGEN: 8 mg/dL (ref 7–21)
CALCIUM: 9.2 mg/dL (ref 8.5–10.2)
CHLORIDE: 109 mmol/L — ABNORMAL HIGH (ref 98–107)
CO2: 27 mmol/L (ref 22.0–30.0)
CREATININE: 0.76 mg/dL (ref 0.60–1.00)
EGFR MDRD AF AMER: >=60 mL/min/{1.73_m2} (ref >=60)
EGFR MDRD NON AF AMER: >=60 mL/min/{1.73_m2} (ref >=60)
GLUCOSE RANDOM: 122 mg/dL (ref 65–179)
SODIUM: 144 mmol/L (ref 135–145)

## 2017-05-20 LAB — MEAN PLATELET VOLUME: Lab: 10

## 2017-05-20 LAB — CBC W/ AUTO DIFF
BASOPHILS ABSOLUTE COUNT: 0.1 10*9/L (ref 0.0–0.1)
BASOPHILS RELATIVE PERCENT: 1 %
EOSINOPHILS ABSOLUTE COUNT: 0.2 10*9/L (ref 0.0–0.4)
HEMATOCRIT: 40.6 % (ref 36.0–46.0)
HEMOGLOBIN: 12.5 g/dL (ref 12.0–16.0)
LARGE UNSTAINED CELLS: 3 % (ref 0–4)
LYMPHOCYTES ABSOLUTE COUNT: 1.2 10*9/L — ABNORMAL LOW (ref 1.5–5.0)
LYMPHOCYTES RELATIVE PERCENT: 15.3 %
MEAN CORPUSCULAR HEMOGLOBIN CONC: 30.8 g/dL — ABNORMAL LOW (ref 31.0–37.0)
MEAN CORPUSCULAR HEMOGLOBIN: 28.2 pg (ref 26.0–34.0)
MEAN CORPUSCULAR VOLUME: 91.6 fL (ref 80.0–100.0)
MONOCYTES ABSOLUTE COUNT: 0.6 10*9/L (ref 0.2–0.8)
MONOCYTES RELATIVE PERCENT: 7.7 %
NEUTROPHILS RELATIVE PERCENT: 70.6 %
PLATELET COUNT: 187 10*9/L (ref 150–440)
RED BLOOD CELL COUNT: 4.43 10*12/L (ref 4.00–5.20)
RED CELL DISTRIBUTION WIDTH: 15 % (ref 12.0–15.0)
WBC ADJUSTED: 8 10*9/L (ref 4.5–11.0)

## 2017-05-20 LAB — SMEAR REVIEW

## 2017-05-20 LAB — LIPASE
LIPASE: 159 U/L (ref 44–232)
Triacylglycerol lipase:CCnc:Pt:Ser/Plas:Qn:: 159

## 2017-05-20 LAB — INR: Lab: 1.41

## 2017-05-20 LAB — GLUCOSE FLUID TYPE

## 2017-05-20 LAB — PROTIME-INR: PROTIME: 16.1 s — ABNORMAL HIGH (ref 10.2–12.8)

## 2017-05-20 LAB — CALCIUM: Calcium:MCnc:Pt:Ser/Plas:Qn:: 9.2

## 2017-05-20 NOTE — Unmapped (Signed)
Vitals:    05/20/17 1307   BP: 176/85   Pulse: 92   Resp: 16   Temp: 36.9 ??C (98.4 ??F)   SpO2: 100%     I assumed care of this patient from the prior resident, Dr. Jolaine Artist, In short, this is a 62 y.o.patient with PMH of cirrhosis, hepatitis C, and COPD (on home O2) presenting to the ED with abdominal distention and abdominal pain, with associated difficulty breathing. Results of diagnostic paracentesis pending at sign out. Anticipate admission for large volume paracentesis.     5:47 PM   Labs notable for nucleated cells of 255. Will start IV ceftriaxone, give albumin, and discuss with MAO for admission.     Only 10% neutrophils, not criteria for SBP, will admit to obs for admission of large volume paracentesis.     Documentation assistance was provided by Geroge Baseman, Scribe, on May 20, 2017 at 3:26 PM for Irven Baltimore, MD.    Documentation assistance was provided by the scribe in my presence.  The documentation recorded by the scribe has been reviewed by me and accurately reflects the services I personally performed.

## 2017-05-20 NOTE — Unmapped (Signed)
Patient rounds completed. The following patient needs were addressed:  Pain, Toileting, Personal Belongings, Plan of Care, Call Bell in Reach and Bed Position Low .

## 2017-05-20 NOTE — Unmapped (Signed)
Reports abdominal swelling and pain ongoing over last week
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
## 2017-05-21 LAB — CBC
HEMATOCRIT: 36.5 % (ref 36.0–46.0)
HEMOGLOBIN: 11.5 g/dL — ABNORMAL LOW (ref 12.0–16.0)
MEAN CORPUSCULAR HEMOGLOBIN CONC: 31.4 g/dL (ref 31.0–37.0)
MEAN CORPUSCULAR HEMOGLOBIN: 28.2 pg (ref 26.0–34.0)
MEAN CORPUSCULAR VOLUME: 89.8 fL (ref 80.0–100.0)
MEAN PLATELET VOLUME: 10.1 fL — ABNORMAL HIGH (ref 7.0–10.0)
PLATELET COUNT: 175 10*9/L (ref 150–440)
RED CELL DISTRIBUTION WIDTH: 14.7 % (ref 12.0–15.0)
WBC ADJUSTED: 6.1 10*9/L (ref 4.5–11.0)

## 2017-05-21 LAB — COMPREHENSIVE METABOLIC PANEL
ALBUMIN: 3.8 g/dL (ref 3.5–5.0)
ALT (SGPT): 22 U/L (ref 15–48)
ANION GAP: 9 mmol/L (ref 9–15)
AST (SGOT): 32 U/L (ref 14–38)
BILIRUBIN TOTAL: 1 mg/dL (ref 0.0–1.2)
BLOOD UREA NITROGEN: 7 mg/dL (ref 7–21)
BUN / CREAT RATIO: 10
CALCIUM: 9.1 mg/dL (ref 8.5–10.2)
CHLORIDE: 109 mmol/L — ABNORMAL HIGH (ref 98–107)
CO2: 26 mmol/L (ref 22.0–30.0)
EGFR MDRD NON AF AMER: >=60 mL/min/{1.73_m2} (ref >=60)
GLUCOSE RANDOM: 153 mg/dL (ref 65–179)
POTASSIUM: 4.4 mmol/L (ref 3.5–5.0)
PROTEIN TOTAL: 6.8 g/dL (ref 6.5–8.3)
SODIUM: 144 mmol/L (ref 135–145)

## 2017-05-21 LAB — AST (SGOT): Aspartate aminotransferase:CCnc:Pt:Ser/Plas:Qn:: 32

## 2017-05-21 LAB — PRO-BNP: Natriuretic peptide.B prohormone N-Terminal:MCnc:Pt:Ser/Plas:Qn:: 93.3

## 2017-05-21 LAB — INR: Lab: 1.39

## 2017-05-21 LAB — RED BLOOD CELL COUNT: Lab: 4.07

## 2017-05-21 NOTE — Unmapped (Signed)
Pt arrived from ED due to increased SOB and abdominal ascites. Pt A&ox4 no acute signs of distress noted. Pt oriented to room and unit. Call bell within reach. Pt in low locked position. VSS, pt on 2l O2 NS @ home baseline. Pt denies hunger at this time. Will continue to monitor.  Problem: Adult Inpatient Plan of Care  Goal: Plan of Care Review  05/21/2017 0020 by Winferd Humphrey, RN  Outcome: Progressing  05/21/2017 0016 by Winferd Humphrey, RN  Outcome: Progressing  Goal: Patient-Specific Goal (Individualization)  05/21/2017 0020 by Winferd Humphrey, RN  Outcome: Progressing  05/21/2017 0016 by Winferd Humphrey, RN  Outcome: Progressing  Goal: Absence of Hospital-Acquired Illness or Injury  05/21/2017 0020 by Winferd Humphrey, RN  Outcome: Progressing  05/21/2017 0016 by Winferd Humphrey, RN  Outcome: Progressing  Goal: Optimal Comfort and Wellbeing  05/21/2017 0020 by Winferd Humphrey, RN  Outcome: Progressing  05/21/2017 0016 by Winferd Humphrey, RN  Outcome: Progressing  Goal: Readiness for Transition of Care  05/21/2017 0020 by Winferd Humphrey, RN  Outcome: Progressing  05/21/2017 0016 by Winferd Humphrey, RN  Outcome: Progressing  Goal: Rounds/Family Conference  05/21/2017 0020 by Winferd Humphrey, RN  Outcome: Progressing  05/21/2017 0016 by Winferd Humphrey, RN  Outcome: Progressing     Problem: Self-Care Deficit  Goal: Improved Ability to Complete Activities of Daily Living  05/21/2017 0020 by Winferd Humphrey, RN  Outcome: Progressing  05/21/2017 0016 by Winferd Humphrey, RN  Outcome: Progressing
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
## 2017-05-21 NOTE — Unmapped (Signed)
Cheeseburger with L/T/O, fries, ketchup, fruit cup, sweet tea ordered for pt. Tray should arrive by 2145.

## 2017-05-21 NOTE — Unmapped (Signed)
Care Management  Initial Transition Planning Assessment    CM met with patient in ED at bedside and completed CAT assessment. Pt verified that all information was correct. Pt reported that she lives alone  at address provided in a Apt that has no steps to enter. Pt expressed that she has great support from her siblings that also live in [place] within 15 mins away from her. Pt shared  that she recently received Medicaid Benefits that were implemented at the beginning of April and was inquiring during the assessment about having a Home Health Aide coordinated. This CM instructed pt to call her local DSS worker or Medicaid office to gain further information on having these services coordinated pt verbalized understanding. Pt also stated that she will be starting out patient services at [facility] in [place] and that she has a peer Support person that helps her with  transportation to and from  her appointments only during the week.In patient  CM will continue to follow for avoidable delays and opportunities for progression of care.     Type of Residence: Mailing Address:  [address]  [place] [address]  Contacts:     Santiago Glad (Sister) [phone]  Saniyah Mondesir (Sister) [phone]  Patient Phone Number: [phone]        Medical Provider(s): Earleen Reaper [facility], [place]  Reason for Admission: Admitting Diagnosis:  No admission diagnoses are documented for this encounter.  Past Medical History:   has a past medical history of Acute renal failure (ARF) (CMS-HCC), Allergic reaction caused by a drug, Asthma, Asthma, Cirrhosis (CMS-HCC), COPD (chronic obstructive pulmonary disease) (CMS-HCC), Diabetes mellitus (CMS-HCC), Hepatitis C, Hypertension, and Sinusitis.  Past Surgical History:   has a past surgical history that includes Hysterectomy; Appendectomy; Cholecystectomy; Carpal tunnel release; pr upper gi endoscopy,diagnosis (N/A, 07/18/2014); pr colon ca scrn not hi rsk ind (N/A, 07/18/2014); Oophorectomy; and Reduction mammaplasty (Bilateral).   Previous admit date: 10/15/2016    Primary Insurance- Payor: MEDICARE / Plan: MEDICARE PART A AND PART B / Product Type: *No Product type* /   Secondary Insurance ??? Secondary Insurance  MEDICAID [place]  Prescription Coverage ???  Preferred Pharmacy - [id number] - [place], [place] - [address]    Transportation home: Private vehicle  Level of function prior to admission: Independent                            General  Care Manager assessed the patient by : In person interview with patient, Medical record review  Orientation Level: Oriented X4  Who provides care at home?: Other (Comment)(Pt reported being independent at baseline )    Contact/Decision Maker        Extended Emergency Contact Information  Primary Emergency Contact: Villines,[name]   [place]  Home Phone: [phone]  Relation: Sister    Legal Next of Kin / Guardian / POA / Advance Directives       Advance Directive (Medical Treatment)  Does patient have an advance directive covering medical treatment?: Patient does not have advance directive covering medical treatment.  Reason patient does not have an advance directive covering medical treatment:: Patient does not wish to complete one at this time, Patient needs follow-up to complete one.  Reason there is not a Health Care Decision Maker appointed:: Patient does not wish to appoint a Health Care Decision Maker at this time  Information provided on advance directive:: No  Patient  requests assistance:: No    Advance Directive (Mental Health Treatment)  Does patient have an advance directive covering mental health treatment?: Patient does not have advance directive covering mental health treatment.  Reason patient does not have an advance directive covering mental health treatment:: Patient does not wish to complete one at this time.    Patient Information  Lives with: Alone    Type of Residence: Private residence        Location/Detail: [address] [place] [address] in a 1st Floor Apt with no steps to enter    Support Systems: Family Members, Comment(Peer Support )    Responsibilities/Dependents at home?: No    Home Care services in place prior to admission?: No          Outpatient/Community Resources in place prior to admission: Clinic  Agency detail (Name/Phone #): PCP-WICHMAN, NOAH J @ [facility]     Equipment Currently Used at Home: oxygen  Current Radiation protection practitioner (Name/Phone #): [employer] / Address: [address] # [place], [place] [address]/Phone: [phone]    Currently receiving outpatient dialysis?: No       Financial Information       Need for financial assistance?: No       Social Determinants of Health  Social Determinants of Health were addressed in provider documentation.  Please refer to patient history.    Discharge Needs Assessment  Concerns to be Addressed: discharge planning    Clinical Risk Factors: Lives Alone or Absence of Caregiver to Assist with Discharge and Home Care, History of Falls    Barriers to taking medications: No    Prior overnight hospital stay or ED visit in last 90 days: Yes    Readmission Within the Last 30 Days: no previous admission in last 30 days         Anticipated Changes Related to Illness: other (see comments)(TBD)    Equipment Needed After Discharge: none    Discharge Facility/Level of Care Needs: other (see comments)(Home )    Readmission  Risk of Unplanned Readmission Score:  %  Readmitted Within the Last 30 Days?   Patient at risk for readmission?: Yes    Discharge Plan  Screen findings are: Care Manager reviewed the plan of the patient's care with the Multidisciplinary Team. No discharge planning needs identified at this time. Care Manager will continue to manage plan and monitor patient's progress with the team.    Expected Discharge Date: 05/21/17    Expected Transfer from Critical Care:      Patient and/or family were provided with choice of facilities / services that are available and appropriate to meet post hospital care needs?: Yes   List choices in order highest to lowest preferred, if applicable. : No Preference for services     Initial Assessment complete?: Yes

## 2017-05-21 NOTE — Unmapped (Signed)
Patient rounds completed. The following patient needs were addressed:  Pain, Toileting, Personal Belongings, Plan of Care, Call Bell in Reach and Bed Position Low .

## 2017-05-21 NOTE — Unmapped (Signed)
Please schedule an appointment with your primary care doctor, Dr. Alveda Reasons.  You should be seen for a hospital follow-up appointment in the next two weeks following discharge.

## 2017-05-21 NOTE — Unmapped (Signed)
Observation MD paged to inform them of pt's blood pressure of 172/88.

## 2017-05-21 NOTE — Unmapped (Signed)
Large Volume Paracentesis Procedure Note (CPT [phone])    Pre-procedural Planning     Patient Name:: Chloe Moore  Patient MRN: [id number]    Indications:  Ascites of unknown etiology    Known Bleeding Diathesis: Patient/caregiver denies any known bleeding or platelet disorder.     Antiplatelet Agents: This patient is not on an antiplatelet agent.    Systemic Anticoagulation: This patient is not on full systemic anticoagulation.    Significant Labs:  INR   Date Value Ref Range Status   05/21/2017 1.39  Final   05/23/2014 1.4  Final     PT   Date Value Ref Range Status   05/21/2017 15.9 (H) 10.2 - 12.8 sec Final     APTT   Date Value Ref Range Status   05/11/2017 33.2 27.7 - 37.7 sec Final     Platelet   Date Value Ref Range Status   05/21/2017 175 150 - 440 10*9/L Final   05/23/2014 185 150 - 440 10*9/L Final       Consent: Informed consent was obtained after explanation of the risks (including bleeding, infection, bowel perforation, and leaking) and benefits of the procedure. Refer to the consent documentation.    Procedure Details     Time-out was performed immediately prior to the procedure.    The head of the bed was placed 45 degrees above level and a large area of ascitic fluid was identified using ultrasound in the left lower quadrant.  The linear probe with color doppler was used to ensure that a vessel was not located in the proposed needle path.    Skin was cleaned with Chlorhexidine. Local anesthesia with 1 percent lidocaine was introduced subcutaneously then deep to the skin until the parietal peritoneum was anesthetized. A small incision was made at the skin site.  A Yueh needle with a catheter was introduced into this site using the  z-line technique until ascitic fluid was encountered and the catheter was introduced over the needle.  Ascitic fluid and the catheter were removed with minimal bleeding. Pressure bandage was applied after holding pressure and achieving hemostasis.    Albumin was not administered at the time of paracentesis as we removed only 3 L    Findings     3 liters of cloudy ascites fluid was obtained.  In specimen vials, fluid was slightly milky but translucent.  In the 3 L collection container, fluid was fairly opaque and milky in appearance.    The ascites fluid was left at the bedside to be sent for labs and studies of the team's discretion.  Primary team was informed of fluid sample at the bedside.    Condition     The patient tolerated the procedure well and remains in the same condition as pre-procedure.    Complications     None; patient tolerated the procedure well.      Requesting Service: General Medicine (MED)

## 2017-05-21 NOTE — Unmapped (Signed)
Report attempted x 1, this writer asked to call back in 10 minutes.  OBS MD paged x 2 to inform them of pt's blood pressure.

## 2017-05-21 NOTE — Unmapped (Signed)
Pt in bed resting. Pt has mild respiratory distress r/t ascites. Pt denies any other discomfort.   Problem: Adult Inpatient Plan of Care  Goal: Plan of Care Review  Outcome: Progressing  Goal: Patient-Specific Goal (Individualization)  Outcome: Progressing  Goal: Absence of Hospital-Acquired Illness or Injury  Outcome: Progressing  Goal: Optimal Comfort and Wellbeing  Outcome: Progressing  Goal: Readiness for Transition of Care  Outcome: Progressing  Goal: Rounds/Family Conference  Outcome: Progressing     Problem: Self-Care Deficit  Goal: Improved Ability to Complete Activities of Daily Living  Outcome: Progressing

## 2017-05-21 NOTE — Unmapped (Addendum)
Social Work  Psychosocial Assessment  UPDATE: SW received a call from CMA stating [facility] can offer Social Work services and AID service, pt agreed to remain with [facility].   Patient Name: Chloe Moore   Medical Record Number: [id number]   Date of Birth: [birth year]  Sex: Female     Referral  Referred by: Care Manager  Reason for Referral: Adjustment to Illness / Diagnosis, Complex Discharge Planning, Mental Health Issues, Patient Safety Concern Upon Discharge (i.e. hoarding, lack of 24/7, etc.), Readmission for Psychosocial Concerns    Extended Emergency Contact Information  Primary Emergency Contact: Villines,[name]   [place]  Home Phone: [phone]  Relation: Sister    Legal Next of Kin / Guardian / POA / Advance Directives      Advance Directive (Medical Treatment)  Does patient have an advance directive covering medical treatment?: Patient does not have advance directive covering medical treatment.  Reason patient does not have an advance directive covering medical treatment:: Patient does not wish to complete one at this time  Reason there is not a Health Care Decision Maker appointed:: Patient does not wish to appoint a Health Care Decision Maker at this time  Information provided on advance directive:: Yes  Patient requests assistance:: No    Advance Directive (Mental Health Treatment)  Does patient have an advance directive covering mental health treatment?: Patient does not have advance directive covering mental health treatment.  Reason patient does not have an advance directive covering mental health treatment:: Patient does not wish to complete one at this time.    Discharge Planning  Discharge Planning Information:   Type of Residence   Mailing Address:  [address]  [place] [address]  Physical Address: Pt currently lives alone, in [facility]    Medical Information       Financial Information   Primary Insurance: Payor: MEDICARE / Plan: MEDICARE PART A AND PART B / Product Type: *No Product type* /    Secondary Insurance: Secondary Insurance  MEDICAID [place]   Prescription Coverage: Medicare D     Preferred Pharmacy: [facility] - [place], [place] - [address]    Barriers to taking medication: No    Transition Home   Transportation at time of discharge: Taxi    Anticipated changes related to Illness: none   Services in place prior to admission: Home Based Services: Pt has HH services with [facility], however requesting a new HH agency with Social Work services  Home Medical Equipment: Pt has HM O2, r walker, nebulizer,    Services anticipated for DC: Home Based Services: Pt to return home with [facility] services with a new HH agency.    Hemodialysis Prior to Admission: No    Readmission  Risk of Unplanned Readmission Score:  %  Readmitted Within the Last 30 Days?   Readmission Factors include: current reason for admission unrelated to previous admission    Social Determinants of Health  Social Determinants of Health were addressed in provider documentation.  Please refer to patient history.    SW met with pt and introduced self to pt and explained to her SW's role in her discharge planning,.   Social History  Support Systems: Family Members, [employer], Friends/Neighbors: Per pt she reports she has never been married and does not have any bio children, both parents are deceased, pt has 5 brothers and 4 sisters, 2 sisters live in [place] and 2 brothers also live there, Pt is a  [employer].        Medical and Psychiatric History  Psychosocial Stressors: Concern for non-compliance, Coping with health challenges/recent hospitalization, Family issues / concerns, Financial concerns, Lack of Caregivers  / Caregiver burn out, Isolation  (e.g. feeling cut off, patient self isolates)  Pt's primary stressor is lack of caregivers, pt was excited to learn she recently was eligible for MCD, however SW explained the MCD MQB is only to pay for her Part B MCR Premiums. SW told her she or family member would need to speak with her county DSS MCD worker and ask about her changing her MCD to community MCD. SW also added a HH Child psychotherapist with her HH orders to assist her with this issue. 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 Pt stated 5 yrs ago she was inpatient at [facility] for SI.           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      Outpatient Providers: Primary Care Provider: Milus Mallick, NP      Ability to [employer] Services: Inadequate/unavailable services, Lack of transportation  Pt lacks transportation, SW provided pt's nurse with taxi voucher, SW verified pt has a portable O2 to return home.   SW also completed a DC HM form for [facility] services with PT OT RN MED SW HM AID, [facility] 05-22-17, pt prefers a different agency because the [facility] agency she has currently does not have Quarry manager. SW tasked CMA to arrange for [facility] services.   Triggers for social work consultation identified on admission.  Social worker consulted for full psychosocial and discharge needs assessment.  CM will continue to follow for avoidable delays and opportunities for progression of care.   05/21/2017 3:28 PM   Festus Barren, MSW  Page [phone]'

## 2017-05-21 NOTE — Unmapped (Signed)
Patient resting in bed this shift, able to make needs known via clear speech.  Voiding well,  Tolerating diet. NPO at present for ultrasound at 1700; denies pain.  O2 intact. Paracentesis done bedside; pt with no complaints.  VSS
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
## 2017-05-22 MED ORDER — FUROSEMIDE 20 MG TABLET
ORAL_TABLET | Freq: Every day | ORAL | 11 refills | 0 days | Status: SS
Start: 2017-05-22 — End: 2018-10-08

## 2017-05-22 MED ORDER — SPIRONOLACTONE 50 MG TABLET
ORAL_TABLET | Freq: Every day | ORAL | 11 refills | 0.00000 days | Status: CP
Start: 2017-05-22 — End: 2018-10-06
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
## 2017-06-16 ENCOUNTER — Ambulatory Visit: Admit: 2017-06-16 | Discharge: 2017-06-17 | Payer: MEDICARE | Attending: Family | Primary: Family

## 2017-06-16 DIAGNOSIS — K7460 Unspecified cirrhosis of liver: Principal | ICD-10-CM

## 2017-06-16 DIAGNOSIS — G9349 Other encephalopathy: Secondary | ICD-10-CM

## 2017-06-16 DIAGNOSIS — I10 Essential (primary) hypertension: Secondary | ICD-10-CM

## 2017-06-16 DIAGNOSIS — R188 Other ascites: Secondary | ICD-10-CM

## 2017-06-16 LAB — CBC W/ AUTO DIFF
BASOPHILS ABSOLUTE COUNT: 0.1 10*9/L (ref 0.0–0.1)
BASOPHILS RELATIVE PERCENT: 1.4 %
EOSINOPHILS ABSOLUTE COUNT: 0.3 10*9/L (ref 0.0–0.4)
EOSINOPHILS RELATIVE PERCENT: 4.1 %
HEMATOCRIT: 42.2 % (ref 36.0–46.0)
HEMOGLOBIN: 13.1 g/dL (ref 12.0–16.0)
LYMPHOCYTES ABSOLUTE COUNT: 1.2 10*9/L — ABNORMAL LOW (ref 1.5–5.0)
LYMPHOCYTES RELATIVE PERCENT: 19.5 %
MEAN CORPUSCULAR HEMOGLOBIN CONC: 31.2 g/dL (ref 31.0–37.0)
MEAN CORPUSCULAR HEMOGLOBIN: 27.9 pg (ref 26.0–34.0)
MEAN PLATELET VOLUME: 10.6 fL — ABNORMAL HIGH (ref 7.0–10.0)
MONOCYTES ABSOLUTE COUNT: 0.7 10*9/L (ref 0.2–0.8)
MONOCYTES RELATIVE PERCENT: 10.3 %
NEUTROPHILS RELATIVE PERCENT: 61.4 %
PLATELET COUNT: 153 10*9/L (ref 150–440)
RED BLOOD CELL COUNT: 4.71 10*12/L (ref 4.00–5.20)
RED CELL DISTRIBUTION WIDTH: 14 % (ref 12.0–15.0)
WBC ADJUSTED: 6.3 10*9/L (ref 4.5–11.0)

## 2017-06-16 LAB — BASIC METABOLIC PANEL
ANION GAP: 13 mmol/L (ref 9–15)
BLOOD UREA NITROGEN: 14 mg/dL (ref 7–21)
BUN / CREAT RATIO: 14
CALCIUM: 10.4 mg/dL — ABNORMAL HIGH (ref 8.5–10.2)
CHLORIDE: 98 mmol/L (ref 98–107)
CO2: 30 mmol/L (ref 22.0–30.0)
CREATININE: 0.99 mg/dL (ref 0.60–1.00)
EGFR MDRD AF AMER: 69 mL/min/{1.73_m2} (ref >=60)
EGFR MDRD NON AF AMER: 57 mL/min/{1.73_m2} — ABNORMAL LOW (ref >=60)
GLUCOSE RANDOM: 170 mg/dL (ref 65–179)
POTASSIUM: 4.4 mmol/L (ref 3.5–5.0)

## 2017-06-16 LAB — HEPATIC FUNCTION PANEL
ALBUMIN: 4.2 g/dL (ref 3.5–5.0)
ALKALINE PHOSPHATASE: 110 U/L (ref 38–126)
ALT (SGPT): 42 U/L (ref 15–48)
BILIRUBIN DIRECT: 0.4 mg/dL (ref 0.00–0.40)
BILIRUBIN TOTAL: 0.7 mg/dL (ref 0.0–1.2)
PROTEIN TOTAL: 8.2 g/dL (ref 6.5–8.3)

## 2017-06-16 LAB — BILIRUBIN TOTAL: Bilirubin:MCnc:Pt:Ser/Plas:Qn:: 0.7

## 2017-06-16 LAB — MONOCYTES RELATIVE PERCENT: Lab: 10.3

## 2017-06-16 LAB — PROTIME-INR: PROTIME: 15.2 s — ABNORMAL HIGH (ref 10.2–12.8)

## 2017-06-16 LAB — INR: Lab: 1.33

## 2017-06-16 LAB — EGFR MDRD AF AMER: Glomerular filtration rate/1.73 sq M.predicted.black:ArVRat:Pt:Ser/Plas/Bld:Qn:Creatinine-based formula (MDRD): 69

## 2017-06-16 MED ORDER — DIAPER,BRIEF,ADULT,DISPOSABLE
6 refills | 0 days | Status: CP
Start: 2017-06-16 — End: ?

## 2017-06-16 NOTE — Unmapped (Addendum)
1.  Laboratory studies ordered today as part of continued liver care.   2.  Office follow up three months.   3.  Remain scheduled for echocardiogram as well as ultrasound liver doppler study.   4.  Continue Lasix 20 mg daily along with Aldactone 50 mg daily.   5. Strict adherence with low sodium diet, less than 2,000 mg total daily.   6.  Increase lean protein in your diet with goal of 30 grams with each meal and high protein snack at bedtime.   7.  No alcohol use.   8.  Take Lactulose 30 ml by mouth four times daily with the goal of two to three bowel movements daily. Continue Xifaxan 550 mg by mouth twice daily.   9.  Any questions please let me know.

## 2017-06-16 NOTE — Unmapped (Signed)
[facility]    Chloe Moore, M.D.  Professor of Medicine  Director, [facility]  Chloe Moore of [place] [facility] [place]    [phone]    REFERRING PROVIDER: Elisabeth Moore, [place]  [address]  [facility]  [place], [place] [address]    PRIMARY CARE PROVIDER: Elisabeth Cara, PA    SUBJECTIVE:     REASON FOR VISIT:  De compensated cirrhosis s/p Hepatitis C Treatment with 24 weeks ~CURE    HISTORY OF PRESENT ILLNESS:      Chloe Moore is a 62 y.o. 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  She presents today with her friend, Chloe Moore who has not been present during her other visits.     Since her last clinic visit on 03/10/2017, she has been hospitalized twice. She was discharged on May 21, 2017 after receiving medical management for ascites and abdominal pain. Initial admission date of May 20, 2017. Last admission date. She has remained on Lasix 20 mg daily along with Aldactone 50 mg daily.  Appears BLE and ascites are well controlled. She is attempting to adhere to low sodium diet, no more than 2,000 mg total daily. Noted weight loss from 263 pounds to 251 pounds. She states, she has been feeling much better. She has numerous family members as well as friends who have been checking on her. Average blood sugar readings are 145 to 185.  Since being discharged she has home health nurse which has been checking on her.     Experiencing intermittent occurrences of nausea. Denies vomiting, abdominal pain, chest pain, unusual shortness of breath, fevers, chills, pruritis, jaundice, mental status changes, or rashes. Appears HE is well controlled. Denies altered sleep patterns, unsteady gait, or slurred speech.     [name] believes she was diagnosed with Hepatitis C in 1997.  At that time, she was treated with a course of ribavirin and peg interferon at [facility], but she does not believe she completed the course due to side effects. She has been successfully treated and cured of Hepatitis C treatment s/p 24 weeks of Harvoni.  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  She also has a history of heavy alcohol use for many years, and she says she drank more than 1 pint of liquor many days of the week.  She quit 3 1/2 years ago, when she moved into an assisted living facility at the urging of her family.      REVIEW OF SYSTEMS:   All systems were reviewed and negative except in HPI.     PAST MEDICAL HISTORY:  Past Medical History:   Diagnosis Date   ??? Acute renal failure (ARF) (CMS-HCC)    ??? Allergic reaction caused by a drug    ??? Asthma    ??? Asthma    ??? Cirrhosis (CMS-HCC)    ??? COPD (chronic obstructive pulmonary disease) (CMS-HCC)    ??? Diabetes mellitus (CMS-HCC)    ??? Hepatitis C    ??? Hypertension    ??? Sinusitis      Liver Care:  -- Chronic hepatitis C, treatment-experienced, genotype 1a ~ successfully treated Harvoni x 24 hours.   -- Fibroscan 03/14/2014:  45 KPa, F4 disease  -- AFP 17.4 (03/14/2014)  -- Immunity to Hepatitis A and Hepatitis B  -- HCC screening: CT scan Abd/pelvis with contrast:  Cirrhosis. No focal hepatic abnormality on CT. No biliary ductal dilation. The gallbladder is surgically absent.  PANCREAS: Cystic lesions in the pancreas are not well evaluated on this study, although appear grossly similar compared to prior MRI. There is peripancreatic stranding which could be secondary to mesenteric edema /fluid from cirrhosis. Spleen unremarkable. No evidence of ascites.     -- Variceal Screening: EGD 2016- Normal esophagus.  + portal hypertensive gastropathy.   -- MELD-Na score: 9 at 06/16/2017  3:22 PM  MELD score: 9 at 06/16/2017  3:22 PM  Calculated from:  Serum Creatinine: 0.99 mg/dL (Rounded to 1 mg/dL) at 3/0/8657  8:46 PM  Serum Sodium: 141 mmol/L (Rounded to 137 mmol/L) at 06/16/2017  3:22 PM  Total Bilirubin: 0.7 mg/dL (Rounded to 1 mg/dL) at 10/16/2950  8:41 PM INR(ratio): 1.33 at 06/16/2017  3:22 PM  Age: 89 years  -- OLT candidate - ? Candidate based on psychosocial support.     PAST SURGICAL HISTORY:  Past Surgical History:   Procedure Laterality Date   ??? APPENDECTOMY     ??? CARPAL TUNNEL RELEASE     ??? CHOLECYSTECTOMY     ??? HYSTERECTOMY     ??? OOPHORECTOMY     ??? PR COLON CA SCRN NOT HI RSK IND N/A 07/18/2014    Procedure: COLOREC CNCR SCR;COLNSCPY NO;  Surgeon: Bronson Curb, MD;  [facility]: [facility];  Service: Gastroenterology   ??? PR UPPER GI ENDOSCOPY,DIAGNOSIS N/A 07/18/2014    Procedure: UGI ENDO, INCLUDE ESOPHAGUS, STOMACH, & DUODENUM &/OR JEJUNUM; DX W/WO COLLECTION SPECIMN, BY BRUSH OR WASH;  Surgeon: Bronson Curb, MD;  Location: [facility];  Service: Gastroenterology   ??? REDUCTION MAMMAPLASTY Bilateral      MEDICATIONS:  Current Outpatient Medications   Medication Sig Dispense Refill   ??? albuterol 2.5 mg /3 mL (0.083 %) nebulizer solution Inhale 2.5 mg every six (6) hours as needed.      ??? amLODIPine (NORVASC) 10 MG tablet Take 1 tablet (10 mg total) by mouth daily. 30 tablet 11   ??? blood sugar diagnostic Strp Check blood sugars six times a day. 250.62 Uncontrolled diabetes, uses insulin.     ??? fluticasone propion-salmeterol (ADVAIR) 100-50 mcg/dose diskus Inhale 1 puff Two (2) times a day.     ??? furosemide (LASIX) 20 MG tablet Take 1 tablet (20 mg total) by mouth daily. 30 tablet 11   ??? insulin glargine (LANTUS) 100 unit/mL injection Inject 20 Units under the skin in the evening.     ??? lactulose (CHRONULAC) 10 gram/15 mL solution Take 20g 4 times daily to ensure 3 bowel movements daily. 240 mL 0   ??? lancets Misc Use to check blood sugars six times a day.     ??? linagliptin (TRADJENTA) 5 mg Tab Take 5 mg by mouth daily.     ??? melatonin 3 mg Tab Take 1 tablet by mouth nightly.      ??? metFORMIN (GLUCOPHAGE) 500 MG tablet Take 1 tablet (500 mg total) by mouth 2 (two) times a day with meals.     ??? pen needle, diabetic 32 gauge x 5/32 Ndle Use to give insulin 6 times a day.     ??? pregabalin (LYRICA) 150 MG capsule Take 1 capsule (150 mg total) by mouth Two (2) times a day.     ??? rifAXIMin (XIFAXAN) 550 mg Tab Take 1 tablet (550 mg total) by mouth Two (2) times a day. 60 tablet 5   ???  risperiDONE (RISPERDAL) 2 MG tablet Take 0.5 tablets (1 mg total) by mouth nightly.     ??? spironolactone (ALDACTONE) 50 MG tablet Take 1 tablet (50 mg total) by mouth daily. 30 tablet 11   ??? vortioxetine (TRINTELLIX) 10 mg tablet Take 1 tablet (10 mg total) by mouth daily.     ??? aspirin 81 MG chewable tablet Chew 1 tablet (81 mg total) daily. 30 tablet 0   ??? diaper,brief,adult,disposable (ADULT BRIEF - EXTRA LARGE) Misc Use as directed 90 each 6   ??? metoprolol tartrate (LOPRESSOR) 100 MG tablet Take 0.5 tablets (50 mg total) by mouth Two (2) times a day. 30 tablet 0   ??? potassium chloride (KLOR-CON) 10 MEQ CR tablet Take 1 tablet (10 mEq total) by mouth Two (2) times a day. (Patient not taking: Reported on 06/16/2017)     ??? senna (SENNA) 8.6 mg tablet Take 2 tablets by mouth nightly as needed for constipation. (Patient not taking: Reported on 06/16/2017)       No current facility-administered medications for this visit.    Note Xifaxan prescribed again from today's visit.     ALLERGIES:  Lisinopril and Codeine    FAMILY HISTORY:  No family history of liver disease.      SOCIAL HISTORY:  Social History     Tobacco Use   ??? Smoking status: Former Smoker     Last attempt to quit: 02/21/2014     Years since quitting: 3.3   ??? Smokeless tobacco: Never Used   Substance Use Topics   ??? 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     ??? 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   Previous IV drug use in the 1980s.    OBJECTIVE:   VITAL SIGNS: BP 123/73  - Pulse 78  - Temp 36.9 ??C (98.4 ??F)  - Resp 20  - Ht 170.2 cm (5' 7)  - Wt (!) 114 kg (251 lb 5.8 oz)  - SpO2 96%  - BMI 39.37 kg/m??     PHYSICAL EXAM:  Constitutional: Alert, Oriented x 3, No acute distress, well nourished and well hydrated.  Morbidly obese. Appears to be feeling much better today. More alert and clearer speech.   HEENT: PERRL, conjunctiva clear, anicteric,   CV: Regular rate and rhythm, normal S1, S2. No murmurs. No JVD.   Lung: Clear to auscultation bilaterally. Unlabored breathing. - evidence of pleural effusion.  Abdomen: Large, possible evidence of mild ascites.  + hepatomegaly.  + tenderness RUQ.  + BS four quadrants.  Extremities: No edema, well perfused  MSK: No joint swelling or tenderness noted, no deformities  Skin: No rashes, jaundice or skin lesions noted. No evidence of palmar erythema or spider angiomas.   Neuro: No focal deficits. + asterixis.   Mental Status: . No evidence of temporal wasting.  + evidence of mild HE.      DIAGNOSTIC STUDIES:  I have reviewed all pertinent diagnostic studies, including:    Laboratory results:  Results for orders placed or performed in visit on 06/16/17   Basic metabolic panel   Result Value Ref Range    Sodium 141 135 - 145 mmol/L    Potassium 4.4 3.5 - 5.0 mmol/L    Chloride 98 98 - 107 mmol/L    CO2 30.0 22.0 -  30.0 mmol/L    BUN 14 7 - 21 mg/dL    Creatinine 1.61 0.96 - 1.00 mg/dL    BUN/Creatinine Ratio 14     EGFR MDRD Non Af Amer 57 (L) >=60 mL/min/1.19m2    EGFR MDRD Af Amer 69 >=60 mL/min/1.37m2    Anion Gap 13 9 - 15 mmol/L    Glucose 170 65 - 179 mg/dL    Calcium 04.5 (H) 8.5 - 10.2 mg/dL   Hepatic Function Panel   Result Value Ref Range    Albumin 4.2 3.5 - 5.0 g/dL    Total Protein 8.2 6.5 - 8.3 g/dL    Total Bilirubin 0.7 0.0 - 1.2 mg/dL    Bilirubin, Direct 4.09 0.00 - 0.40 mg/dL    AST 67 (H) 14 - 38 U/L    ALT 42 15 - 48 U/L    Alkaline Phosphatase 110 38 - 126 U/L   PT-INR   Result Value Ref Range    PT 15.2 (H) 10.2 - 12.8 sec    INR 1.33    CBC w/ Differential   Result Value Ref Range    WBC 6.3 4.5 - 11.0 10*9/L    RBC 4.71 4.00 - 5.20 10*12/L HGB 13.1 12.0 - 16.0 g/dL    HCT 81.1 91.4 - 78.2 %    MCV 89.5 80.0 - 100.0 fL    MCH 27.9 26.0 - 34.0 pg    MCHC 31.2 31.0 - 37.0 g/dL    RDW 95.6 21.3 - 08.6 %    MPV 10.6 (H) 7.0 - 10.0 fL    Platelet 153 150 - 440 10*9/L    Neutrophils % 61.4 %    Lymphocytes % 19.5 %    Monocytes % 10.3 %    Eosinophils % 4.1 %    Basophils % 1.4 %    Absolute Neutrophils 3.9 2.0 - 7.5 10*9/L    Absolute Lymphocytes 1.2 (L) 1.5 - 5.0 10*9/L    Absolute Monocytes 0.7 0.2 - 0.8 10*9/L    Absolute Eosinophils 0.3 0.0 - 0.4 10*9/L    Absolute Basophils 0.1 0.0 - 0.1 10*9/L    Large Unstained Cells 3 0 - 4 %    Hypochromasia Slight (A) Not Present     ASSESSMENT AND PLAN:   1.  Decompensated cirrhosis secondary to HCV and alcohol abuse history (+ HE, - ascites, - variceal bleed).    Ms. Fukuhara is a very pleasant 62 yo AA female who presents today for continued cirrhosis care. She had been lost to follow up with our office from July 2017 until she reestablished 10/2016.  Since her last office visit she has been hospitalized twice at [facility] for altered mental status changes, poorly controlled HE and ascites. During hospitalization, she under went diagnostic paracentesis was done in the ED and ruled out SBP (PMN count of 25, cultures negative to date).  SAAG gradient was elevated to 2.5 consistent with portal hypertension.  She had a meld score of 10.  She underwent LVP and had 3 liters of cloudy ascitic fluid removed with great relief of her symptoms.    Her fibroscan was consistent with cirrhosis/F4 disease (45 kPa).  There has been a concern that her AFP has been elevated in the past. Her MRIs have not demonstrated any evidence of HCC.     Since being discharged she has remained well controlled with Lasix 20 mg daily along with Aldactone 50 mg daily. No evidence of BLE. Possible mild  ascites on PE today. Some room for improvement still involving following low sodium diet, less than 2,000 mg total daily. She has made improvement though and eating healthier diet.     ~ MELD labs ordered today.   ~ Office follow up three months.     2.  HE, better controlled but very poor BM pattern: She has only been taking 1/2 recommended dosing of Lactulose at time of discharge.     ~ Instructed Lactulose 30 ml by mouth four times daily with the goal of two to three bowel movements daily. Continue Xifaxan 550 mg by mouth twice daily.     2.  Poorly controlled diabetes mellitus ~ Fairly well controlled. Healthy diet, need to increase lean protein in your diet with goal of 30 grams of lean protein with each meal. High protein snack prior to bedtime.    3.  HCC screening: CT scan of abdomen and pelvis was performed 05/11/2017. Up to date surveillance but given recent hospitalizations, extensive development of ascites requiring LVP, will proceed with US liver doppler study.     4.  Outpatient HE management: Instructed patient ~ Lactulose 30 ml tid with goal of three to four BM daily. Instructed able to titrate lactulose up with dose every two hours until mental status has improved. If no improvement to ER.  New RX sent to patient's local pharmacy ~ Xifaxan 550 mg po bid.     5.  History of HCV ~ Prior treatment-experienced with PEG/RVN at [facility] in 1997, genotype 1a. Successfully treated and cured s/p Harvoni x 24 weeks.     6. Alcohol assessment: No alcohol use. AUDIT ~ zero.     7.  Varices Screening- EGD  07/18/14 Normal esophagus. Portal hypertensive gastropathy. Normal examined duodenum. Will address next visit proceeding forward with surveillance.     8.  Abnormal GI findings ~ MRI of abdomen 04/2016 - Stable multiple cystic lesions in the body and tail measuring up to 1.1 cm. Warrant MRI/MRCP next [facility] surveillance time allowing evaluation of pancreas.     9.  Hypoxia ~ Echocardiogram ordered pending result. She remains on O2/2L/[place].    All patient's and her sister's questions were answered during office visit.      Sister ~ Santiago Glad [facility] , Cell # [phone]).      Rodman Key, DNP, FNP-BC  [facility]  [address]  [place] [address]  Phone [phone]

## 2017-07-06 NOTE — Unmapped (Signed)
I was the supervising physician in the delivery of the service. [name], MD

## 2017-07-28 ENCOUNTER — Encounter: Admit: 2017-07-28 | Discharge: 2017-07-29 | Payer: MEDICARE

## 2017-07-28 DIAGNOSIS — R188 Other ascites: Secondary | ICD-10-CM

## 2017-07-28 DIAGNOSIS — I10 Essential (primary) hypertension: Principal | ICD-10-CM

## 2017-07-28 DIAGNOSIS — K7460 Unspecified cirrhosis of liver: Secondary | ICD-10-CM

## 2017-08-25 MED ORDER — ATORVASTATIN 20 MG TABLET
ORAL | 0.00000 days
Start: 2017-08-25 — End: 2019-06-26

## 2017-08-30 NOTE — Unmapped (Signed)
3rd attempt.  LVM with provider recommendations and return phone number.

## 2017-08-30 NOTE — Unmapped (Signed)
-----   Message from [facility], [place] sent at 08/26/2017 12:59 PM EDT -----  Regarding: echocardiogram result  Chloe Moore,    Please call Chloe Moore and advise her that she needs to be seen by cardiologist based on her echocardiogram finding of aortic stenosis. + history of hypoxia.  I am sorry to ask, but her sister may need to be notified as well as Chloe Moore does suffer from HE.  They can speak to her PCP if she does not already see cardiology or I am more than willing to refer her.     Thank you,    Dawn

## 2017-09-27 ENCOUNTER — Encounter: Admit: 2017-09-27 | Discharge: 2017-09-28 | Payer: MEDICARE | Attending: Family | Primary: Family

## 2017-09-27 DIAGNOSIS — K7290 Hepatic failure, unspecified without coma: Secondary | ICD-10-CM

## 2017-09-27 DIAGNOSIS — K7460 Unspecified cirrhosis of liver: Principal | ICD-10-CM

## 2017-09-27 DIAGNOSIS — K869 Disease of pancreas, unspecified: Secondary | ICD-10-CM

## 2017-09-27 LAB — BASIC METABOLIC PANEL
ANION GAP: 8 mmol/L — ABNORMAL LOW (ref 9–15)
BLOOD UREA NITROGEN: 23 mg/dL — ABNORMAL HIGH (ref 7–21)
BUN / CREAT RATIO: 16
CALCIUM: 9.8 mg/dL (ref 8.5–10.2)
CHLORIDE: 101 mmol/L (ref 98–107)
CO2: 31 mmol/L — ABNORMAL HIGH (ref 22.0–30.0)
CREATININE: 1.4 mg/dL — ABNORMAL HIGH (ref 0.60–1.00)
EGFR CKD-EPI AA FEMALE: 46 mL/min/{1.73_m2} — ABNORMAL LOW (ref >=60)
GLUCOSE RANDOM: 288 mg/dL — ABNORMAL HIGH (ref 65–179)
POTASSIUM: 4.2 mmol/L (ref 3.5–5.0)
SODIUM: 140 mmol/L (ref 135–145)

## 2017-09-27 LAB — CBC W/ AUTO DIFF
BASOPHILS ABSOLUTE COUNT: 0.1 10*9/L (ref 0.0–0.1)
BASOPHILS RELATIVE PERCENT: 1.4 %
EOSINOPHILS ABSOLUTE COUNT: 0.2 10*9/L (ref 0.0–0.4)
EOSINOPHILS RELATIVE PERCENT: 2.3 %
HEMATOCRIT: 41.1 % (ref 36.0–46.0)
HEMOGLOBIN: 13.1 g/dL (ref 12.0–16.0)
LYMPHOCYTES ABSOLUTE COUNT: 1.1 10*9/L — ABNORMAL LOW (ref 1.5–5.0)
MEAN CORPUSCULAR HEMOGLOBIN CONC: 31.8 g/dL (ref 31.0–37.0)
MEAN CORPUSCULAR HEMOGLOBIN: 27.3 pg (ref 26.0–34.0)
MEAN CORPUSCULAR VOLUME: 85.8 fL (ref 80.0–100.0)
MEAN PLATELET VOLUME: 10 fL (ref 7.0–10.0)
MONOCYTES ABSOLUTE COUNT: 0.7 10*9/L (ref 0.2–0.8)
MONOCYTES RELATIVE PERCENT: 10 %
NEUTROPHILS ABSOLUTE COUNT: 4.9 10*9/L (ref 2.0–7.5)
NEUTROPHILS RELATIVE PERCENT: 67.6 %
PLATELET COUNT: 193 10*9/L (ref 150–440)
RED BLOOD CELL COUNT: 4.8 10*12/L (ref 4.00–5.20)
RED CELL DISTRIBUTION WIDTH: 14.8 % (ref 12.0–15.0)
WBC ADJUSTED: 7.3 10*9/L (ref 4.5–11.0)

## 2017-09-27 LAB — PROTEIN TOTAL: Protein:MCnc:Pt:Ser/Plas:Qn:: 6.4 — ABNORMAL LOW

## 2017-09-27 LAB — HEPATIC FUNCTION PANEL
ALKALINE PHOSPHATASE: 136 U/L — ABNORMAL HIGH (ref 38–126)
ALT (SGPT): 48 U/L (ref 13–69)
BILIRUBIN DIRECT: 0.2 mg/dL (ref 0.00–0.40)
BILIRUBIN TOTAL: 0.7 mg/dL (ref 0.0–1.2)
PROTEIN TOTAL: 6.4 g/dL — ABNORMAL LOW (ref 6.5–8.3)

## 2017-09-27 LAB — AFP-TUMOR MARKER: Alpha-1-Fetoprotein.tumor marker:MCnc:Pt:Ser/Plas:Qn:: 9 — ABNORMAL HIGH

## 2017-09-27 LAB — PLATELET COUNT: Lab: 193

## 2017-09-27 LAB — PROTIME: Lab: 14.4 — ABNORMAL HIGH

## 2017-09-27 LAB — ANION GAP: Anion gap 3:SCnc:Pt:Ser/Plas:Qn:: 8 — ABNORMAL LOW

## 2017-09-27 NOTE — Unmapped (Signed)
[facility] LIVER CENTER    [name], M.D.  Professor of Medicine  Director, [facility]  [place] [facility] [place] [facility] [place]    [phone]    REFERRING PROVIDER: Elisabeth Cara, PA  [address]  [facility]  [place], [place] [address]    PRIMARY CARE PROVIDER: Elisabeth Cara, PA    SUBJECTIVE:     REASON FOR VISIT:  Decompensated cirrhosis (+HE, -ascites, - upper GI bleed) s/p Hepatitis C Treatment with 24 weeks ~CURE    HISTORY OF PRESENT ILLNESS:      Chloe Moore is a 62 y.o. 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  She presents today with Chloe Moore, facility employee of [facility]. Since September 09, 2017, Chloe Moore has been residing at the [facility]. She transitioned to group home from her own residence and since the transition she has not been receiving all her normal medications.     She has been off lactulose as well as Xifaxan since September 09, 2017. She was seen ED last PM for confusion. Chloe Moore (care taker) advises, he thought possible secondary to Lyrica dose having been adjusted. Initially, she was started on Lyrica 75 mg po bid. September 22, 2017, the dose was increased 150 mg po tid. During this time, she had been taken off Oxycodone and not resumed until September 24, 2017. Chloe Moore advised when he received her to their home, Chloe Moore's medication lists did not match so he did not know what medications to truly give her.     She has not been hospitalized since her last clinic visit on 06/16/2017. She has not been sleeping well at night. Denies excessive sleeping during the day. + tremors noted the past couple of weeks. She has remained on Lasix 20 mg daily along with Aldactone 50 mg daily.  Appears BLE and ascites are well controlled. Being at the group home allows adherence to low sodium diet, no more than 2,000 mg total daily. Additionally following DM diet. Noted weight loss 251 pounds to 237 pounds today. She had been feeling much better until dose change Lyrica and being off her other medications. Denies vomiting, abdominal pain, chest pain, unusual shortness of breath, fevers, chills, pruritis, jaundice, mental status changes, or rashes. Denies unsteady gait or slurred speech.     Chloe Moore believes she was diagnosed with Hepatitis C in 1997.  At that time, she was treated with a course of ribavirin and peg interferon at [facility], but she does not believe she completed the course due to side effects. She has been successfully treated and cured of Hepatitis C treatment s/p 24 weeks of Harvoni.  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  She also has a history of heavy alcohol use for many years, and she says she drank more than 1 pint of liquor many days of the week.  She quit 3 1/2 years ago, when she moved into an assisted living facility at the urging of her family.      REVIEW OF SYSTEMS:   All systems were reviewed and negative except in HPI.     PAST MEDICAL HISTORY:  Past Medical History:   Diagnosis Date   ??? Acute renal failure (ARF) (CMS-HCC)    ??? Allergic reaction caused by a drug    ??? Asthma    ??? Asthma    ???  Cirrhosis (CMS-HCC)    ??? COPD (chronic obstructive pulmonary disease) (CMS-HCC)    ??? Diabetes mellitus (CMS-HCC)    ??? Hepatitis C    ??? Hypertension    ??? Sinusitis      Liver Care:  -- Chronic hepatitis C, treatment-experienced, genotype 1a ~ successfully treated Harvoni x 24 hours.   -- Fibroscan 03/14/2014:  45 KPa, F4 disease  -- AFP 17.4 (03/14/2014)  -- Immunity to Hepatitis A and Hepatitis B  -- HCC screening:  05/11/2017 CT scan Abd/pelvis with contrast:  Cirrhosis. No focal hepatic abnormality on CT. No biliary ductal dilation. The gallbladder is surgically absent. PANCREAS: Cystic lesions in the pancreas are not well evaluated on this study, although appear grossly similar compared to prior MRI. There is peripancreatic stranding which could be secondary to mesenteric edema /fluid from cirrhosis. Spleen unremarkable. No evidence of ascites.     US Liver doppler 07/28/2017: Portal vein: The main, left and right portal veins are patent with hepatopetal flow. Normal portal vein velocity   (0.20 m/s or greater)  - Splenic vein: Patent, with hepatopetal flow. Hepatic veins/IVC: The IVC, left, middle and right hepatic veins are patent with bi/triphasic waveforms. Hepatic artery: Patent. Visualized proximal aorta is patent    -- Variceal Screening: EGD 2016- Normal esophagus.  + portal hypertensive gastropathy.   -- MELD-Na score: 13 at 09/27/2017  9:31 AM  MELD score: 13 at 09/27/2017  9:31 AM  Calculated from:  Serum Creatinine: 1.40 mg/dL at 1/47/8295  6:21 AM  Serum Sodium: 140 mmol/L (Rounded to 137 mmol/L) at 09/27/2017  9:31 AM  Total Bilirubin: 0.7 mg/dL (Rounded to 1 mg/dL) at 04/16/6576  4:69 AM  INR(ratio): 1.26 at 09/27/2017  9:31 AM  Age: 41 years   Higher score being driven by renal function.   -- OLT candidate - Poor candidate based co morbid conditions     PAST SURGICAL HISTORY:  Past Surgical History:   Procedure Laterality Date   ??? APPENDECTOMY     ??? CARPAL TUNNEL RELEASE     ??? CHOLECYSTECTOMY     ??? HYSTERECTOMY     ??? OOPHORECTOMY     ??? PR COLON CA SCRN NOT HI RSK IND N/A 07/18/2014    Procedure: COLOREC CNCR SCR;COLNSCPY NO;  Surgeon: Bronson Curb, MD;  [facility];  Service: Gastroenterology   ??? PR UPPER GI ENDOSCOPY,DIAGNOSIS N/A 07/18/2014    Procedure: UGI ENDO, INCLUDE ESOPHAGUS, STOMACH, & DUODENUM &/OR JEJUNUM; DX W/WO COLLECTION SPECIMN, BY BRUSH OR WASH;  Surgeon: Bronson Curb, MD;  [facility];  Service: Gastroenterology   ??? REDUCTION MAMMAPLASTY Bilateral      MEDICATIONS:  Current Outpatient Medications   Medication Sig Dispense Refill   ??? acetaminophen (TYLENOL) 500 MG tablet Take 2 tablets by mouth.     ??? albuterol 2.5 mg /3 mL (0.083 %) nebulizer solution Inhale 2.5 mg every six (6) hours as needed.      ??? amLODIPine (NORVASC) 10 MG tablet Take 1 tablet (10 mg total) by mouth daily. 30 tablet 11   ??? aspirin 81 MG chewable tablet Chew 1 tablet (81 mg total) daily. 30 tablet 0   ??? atorvastatin (LIPITOR) 20 MG tablet Take 1 tablet by mouth Continuous.     ??? blood sugar diagnostic Strp Check blood sugars six times a day. 250.62 Uncontrolled diabetes, uses insulin.     ??? diaper,brief,adult,disposable (ADULT BRIEF - EXTRA LARGE) Misc Use as directed  90 each 6   ??? furosemide (LASIX) 20 MG tablet Take 1 tablet (20 mg total) by mouth daily. (Patient taking differently: Take 40 mg by mouth daily. ) 30 tablet 11   ??? hydroCHLOROthiazide (HYDRODIURIL) 25 MG tablet Take 1 tablet by mouth Continuous.     ??? insulin glargine (LANTUS) 100 unit/mL injection Inject 20 Units under the skin in the evening.     ??? lactulose (CHRONULAC) 10 gram/15 mL solution Take 20g 4 times daily to ensure 3 bowel movements daily. 240 mL 0   ??? lancets Misc Use to check blood sugars six times a day.     ??? linagliptin (TRADJENTA) 5 mg Tab Take 5 mg by mouth daily.     ??? melatonin 3 mg Tab Take 1 tablet by mouth nightly.      ??? metFORMIN (GLUCOPHAGE) 500 MG tablet Take 1 tablet (500 mg total) by mouth 2 (two) times a day with meals.     ??? metoprolol tartrate (LOPRESSOR) 100 MG tablet Take 0.5 tablets (50 mg total) by mouth Two (2) times a day. 30 tablet 0   ??? oxyCODONE (ROXICODONE) 5 MG immediate release tablet Take 1 tablet by mouth 4 (four) times a day as needed.     ??? pen needle, diabetic 32 gauge x 5/32 Ndle Use to give insulin 6 times a day.     ??? PNEUMOVAX 23 25 mcg/0.5 mL Syrg injection PHARMACIST ADMINISTERED IMMUNIZATION ADMINISTERED AT TIME OF DISPENSING  0   ??? pregabalin (LYRICA) 150 MG capsule Take 1 capsule (150 mg total) by mouth Two (2) times a day.     ??? rifAXIMin (XIFAXAN) 550 mg Tab Take 1 tablet (550 mg total) by mouth Two (2) times a day. 60 tablet 5   ??? risperiDONE (RISPERDAL) 2 MG tablet Take 0.5 tablets (1 mg total) by mouth nightly.     ??? senna (SENNA) 8.6 mg tablet Take 2 tablets by mouth nightly as needed for constipation.     ??? umeclidinium-vilanterol (ANORO ELLIPTA) 62.5-25 mcg/actuation inhaler Inhale 2 puffs.     ??? fluticasone propion-salmeterol (ADVAIR) 100-50 mcg/dose diskus Inhale 1 puff Two (2) times a day.     ??? potassium chloride (KLOR-CON) 10 MEQ CR tablet Take 1 tablet (10 mEq total) by mouth Two (2) times a day. (Patient not taking: Reported on 06/16/2017)     ??? spironolactone (ALDACTONE) 50 MG tablet Take 1 tablet (50 mg total) by mouth daily. 30 tablet 11   ??? vortioxetine (TRINTELLIX) 10 mg tablet Take 1 tablet (10 mg total) by mouth daily. (Patient not taking: Reported on 09/27/2017)       No current facility-administered medications for this visit.    See scanned MAR.    ALLERGIES:  Lisinopril and Codeine    FAMILY HISTORY:  No family history of liver disease.      SOCIAL HISTORY:  Social History     Tobacco Use   ??? Smoking status: Former Smoker     Last attempt to quit: 02/21/2014     Years since quitting: 3.6   ??? Smokeless tobacco: Never Used   Substance Use Topics   ??? Alcohol use: No     Alcohol/week: 0.0 standard drinks     Comment: Previous heavy alcohol use, on average > 1 pint of liquor per day.     ??? 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   Previous IV drug use in the  1980s.    OBJECTIVE:   VITAL SIGNS: BP 123/79  - Pulse 88  - Temp 37.1 ??C (98.8 ??F) (Oral)  - Resp 20  - Ht 170.2 cm (5' 7.01)  - Wt (!) 107.5 kg (237 lb 1.6 oz)  - SpO2 95% Comment: on 2L of o2. per nasal canula - BMI 37.13 kg/m??     PHYSICAL EXAM:  Constitutional: Alert, Oriented x 3, No acute distress, well nourished and well hydrated. Morbidly obese. Appears to be feeling much better today. More alert and clearer speech.   HEENT: PERRL, conjunctiva clear, anicteric,   CV: Regular rate and rhythm, normal S1, S2. No murmurs. No JVD.   Lung: Clear to auscultation bilaterally. Unlabored breathing. - evidence of pleural effusion.  Abdomen: Large, soft. Mild discomfort noted upper abdomen. + hepatomegaly. Hypoactive BS four quadrants.  Extremities: No edema, well perfused  MSK: No joint swelling or tenderness noted, no deformities  Skin: No rashes, jaundice or skin lesions noted. No evidence of palmar erythema or spider angiomas.   Neuro: No focal deficits. + asterixis.   Mental Status: . No evidence of temporal wasting.  + evidence of mild HE.      DIAGNOSTIC STUDIES:  I have reviewed all pertinent diagnostic studies, including:    Laboratory results:  Results for orders placed or performed in visit on 09/27/17   Basic metabolic panel   Result Value Ref Range    Sodium 140 135 - 145 mmol/L    Potassium 4.2 3.5 - 5.0 mmol/L    Chloride 101 98 - 107 mmol/L    CO2 31.0 (H) 22.0 - 30.0 mmol/L    Anion Gap 8 (L) 9 - 15 mmol/L    BUN 23 (H) 7 - 21 mg/dL    Creatinine 1.61 (H) 0.60 - 1.00 mg/dL    BUN/Creatinine Ratio 16     EGFR CKD-EPI Non-African American, Female 40 (L) >=60 mL/min/1.97m2    EGFR CKD-EPI African American, Female 46 (L) >=60 mL/min/1.9m2    Glucose 288 (H) 65 - 179 mg/dL    Calcium 9.8 8.5 - 09.6 mg/dL   Hepatic Function Panel   Result Value Ref Range    Albumin 3.3 (L) 3.5 - 5.0 g/dL    Total Protein 6.4 (L) 6.5 - 8.3 g/dL    Total Bilirubin 0.7 0.0 - 1.2 mg/dL    Bilirubin, Direct 0.45 0.00 - 0.40 mg/dL    AST 43 17 - 47 U/L    ALT 48 13 - 69 U/L    Alkaline Phosphatase 136 (H) 38 - 126 U/L   PT-INR   Result Value Ref Range    PT 14.4 (H) 10.2 - 12.8 sec    INR 1.26    AFP non-maternal tumor marker   Result Value Ref Range    AFP-Tumor Marker 9 (H) <8 ng/mL   CBC w/ Differential   Result Value Ref Range    WBC 7.3 4.5 - 11.0 10*9/L    RBC 4.80 4.00 - 5.20 10*12/L    HGB 13.1 12.0 - 16.0 g/dL    HCT 40.9 81.1 - 91.4 %    MCV 85.8 80.0 - 100.0 fL    MCH 27.3 26.0 - 34.0 pg    MCHC 31.8 31.0 - 37.0 g/dL    RDW 78.2 95.6 - 21.3 %    MPV 10.0 7.0 - 10.0 fL    Platelet 193 150 - 440 10*9/L    Neutrophils % 67.6 %  Lymphocytes % 14.6 %    Monocytes % 10.0 %    Eosinophils % 2.3 %    Basophils % 1.4 %    Absolute Neutrophils 4.9 2.0 - 7.5 10*9/L    Absolute Lymphocytes 1.1 (L) 1.5 - 5.0 10*9/L    Absolute Monocytes 0.7 0.2 - 0.8 10*9/L    Absolute Eosinophils 0.2 0.0 - 0.4 10*9/L    Absolute Basophils 0.1 0.0 - 0.1 10*9/L    Large Unstained Cells 4 0 - 4 %    Hypochromasia Slight (A) Not Present     ASSESSMENT AND PLAN:   1.  Decompensated cirrhosis secondary to HCV and alcohol abuse history (+ HE, - ascites, - variceal bleed).    Ms. Janota is a very pleasant 62 yo AA female who presents today for continued cirrhosis care. She is currently residing at Group Home since first of August. She has not adhered to her regular medications due to change in residence. She advises she has been feeling better overall, but continues to struggle with HE being well controlled. She has been off Lactulose and Xifaxan. New RX sent for both medications to local pharmacy.  Additionally, mental status change suspected to have been induced by high dose of Lyrica. Seen in ED last pm for mental status change. Pending follow up with pain management clinic involving Lyrica dosing. Her fibroscan was consistent with cirrhosis/F4 disease (45 kPa).  There has been a concern that her AFP has been elevated in the past. Her MRIs have not demonstrated any evidence of HCC.     She had been lost to follow up with our office from July 2017 until she reestablished 10/2016. It has appeared ascites and BLE have been well controlled with Lasix 20 mg daily and Aldactone 50 mg daily. No evidence of BLE. She has been more adherent to low sodium diet, less than 2,000 mg total daily. She has been adhering to diabetic diet. According to care taker, she has continued to struggle with DM being well controlled.     ~ MELD labs ordered today.   ~ Office follow up three months.   ~ Increase lean protein in your diet with goal of 30 grams with each meal and high protein snack at bedtime.  ~ No alcohol.    2. HE -poorly controlled: Will resume Lactulose 30 ml po bid along with Xifaxan 550 mg po bid.  Ability to titrate Lactulose up depending on her response with current regimen.     3. Poorly controlled diabetes mellitus ~ Fairly well controlled. Healthy diet, need to increase lean protein in your diet with goal of 30 grams of lean protein with each meal. High protein snack prior to bedtime.    4. HCC screening: Warranted every six months. Scheduled for MRI of abdomen- December 2019.     5.  History of HCV ~ Prior treatment-experienced with PEG/RVN at [facility] in 1997, genotype 1a. Successfully treated and cured s/p Harvoni x 24 weeks.     6.  Alcohol assessment: No alcohol use. AUDIT ~ zero.     7. Varices Screening- EGD  07/18/14 Normal esophagus. Portal hypertensive gastropathy. Normal examined duodenum. Will address next visit proceeding forward with surveillance.     8.  Abnormal GI findings ~ MRI of abdomen 04/2016 - Stable multiple cystic lesions in the body and tail measuring up to 1.1 cm.  MRI/MRCP ordered for December 2019.     9.  Hypoxia ~ Remains on O2/2L/[place].  All patient's and her sister's questions were answered during office visit.      Sister ~ Chloe Moore [facility] , Cell # [phone]).      Rodman Key, DNP, FNP-BC  [facility]  [address]  [place] [address]  Phone [phone]

## 2017-09-27 NOTE — Unmapped (Signed)
1.  Laboratory studies ordered today as part of continued liver care.   2.  Office follow up December with MRI of abdomen same date.  3.  MRI of abdomen ordered for continued liver cancer screening.   4.  Continue Lasix 20 mg daily along with Aldactone 50 mg daily.   5.  Strict adherence with low sodium diet, less than 2,000 mg total daily.   6.  Increase lean protein in your diet with goal of 30 grams with each meal and high protein snack at bedtime.   7.  No alcohol use.   8.  Take Lactulose 30 ml by mouth twice daily with the goal of two to three bowel movements daily. Continue Xifaxan 550 mg by mouth twice daily. Will send new prescriptions to your local pharmacy.   9.  Any questions please let me know.

## 2017-09-28 MED ORDER — RIFAXIMIN 550 MG TABLET
ORAL_TABLET | Freq: Two times a day (BID) | ORAL | 6 refills | 0 days | Status: CP
Start: 2017-09-28 — End: 2018-01-18

## 2017-09-28 MED ORDER — LACTULOSE 10 GRAM/15 ML ORAL SOLUTION
Freq: Two times a day (BID) | ORAL | 6 refills | 0 days | Status: CP
Start: 2017-09-28 — End: 2018-06-15

## 2017-09-28 NOTE — Unmapped (Signed)
made caretaker aware of providers recommendations to hold Lasix and Spironolactone until Friday August 23rd.  She may resume taking the medications on Friday. pt is to have lab work completed at [facility], orders entered via provider.  Requested further evaluation by PCP.  Glucose elevated at 288, creatinine at 1.4.     Caretaker, Chloe Moore, verbalized understanding with no further questions.

## 2017-09-28 NOTE — Unmapped (Signed)
Spoke with Sharol Harness, LPN about patient's laboratory results from yesterday. Given bump renal function. Recommend holding lasix and aldactone today until Friday. Able to resume Friday both medications with repeat labs on Friday at [facility]. Additionally, she needs to follow up with her PCP about new onset of renal insufficiency. Given the fact her medications have been held and altered with since transition to group home. MELD lab orders placed [facility] to be drawn on Friday.

## 2017-09-30 NOTE — Unmapped (Signed)
I was the supervising physician in the delivery of the service. [name], MD

## 2017-10-02 LAB — CBC W/ DIFFERENTIAL
BANDED NEUTROPHILS ABSOLUTE COUNT: 0 10*3/uL (ref 0.0–0.1)
BASOPHILS ABSOLUTE COUNT: 0.1 10*3/uL (ref 0.0–0.2)
BASOPHILS RELATIVE PERCENT: 1 %
EOSINOPHILS ABSOLUTE COUNT: 0.2 10*3/uL (ref 0.0–0.4)
EOSINOPHILS RELATIVE PERCENT: 4 %
HEMATOCRIT: 39 % (ref 34.0–46.6)
HEMOGLOBIN: 12.5 g/dL (ref 11.1–15.9)
LYMPHOCYTES ABSOLUTE COUNT: 0.8 10*3/uL (ref 0.7–3.1)
LYMPHOCYTES RELATIVE PERCENT: 14 %
MEAN CORPUSCULAR HEMOGLOBIN CONC: 32.1 g/dL (ref 31.5–35.7)
MEAN CORPUSCULAR HEMOGLOBIN: 27.5 pg (ref 26.6–33.0)
MEAN CORPUSCULAR VOLUME: 86 fL (ref 79–97)
MONOCYTES ABSOLUTE COUNT: 0.9 10*3/uL (ref 0.1–0.9)
NEUTROPHILS RELATIVE PERCENT: 65 %
PLATELET COUNT: 159 10*3/uL (ref 150–450)
RED BLOOD CELL COUNT: 4.54 x10E6/uL (ref 3.77–5.28)
RED CELL DISTRIBUTION WIDTH: 15.2 % (ref 12.3–15.4)
WHITE BLOOD CELL COUNT: 5.7 10*3/uL (ref 3.4–10.8)

## 2017-10-02 LAB — BASIC METABOLIC PANEL
BLOOD UREA NITROGEN: 11 mg/dL (ref 8–27)
BUN / CREAT RATIO: 10 — ABNORMAL LOW (ref 12–28)
CO2: 22 mmol/L (ref 20–29)
CREATININE: 1.1 mg/dL — ABNORMAL HIGH (ref 0.57–1.00)
GFR MDRD AF AMER: 62 mL/min/{1.73_m2}
GLUCOSE: 352 mg/dL — ABNORMAL HIGH (ref 65–99)
POTASSIUM: 4.4 mmol/L (ref 3.5–5.2)
SODIUM: 136 mmol/L (ref 134–144)

## 2017-10-02 LAB — CREATININE: Lab: 1.1 — ABNORMAL HIGH

## 2017-10-02 LAB — HEPATIC FUNCTION PANEL
ALBUMIN: 3.5 g/dL — ABNORMAL LOW (ref 3.6–4.8)
ALT (SGPT): 38 IU/L — ABNORMAL HIGH (ref 0–32)
AST (SGOT): 55 IU/L — ABNORMAL HIGH (ref 0–40)
BILIRUBIN DIRECT: 0.23 mg/dL (ref 0.00–0.40)
TOTAL PROTEIN: 6.1 g/dL (ref 6.0–8.5)

## 2017-10-02 LAB — PROTHROMBIN TIME: Lab: 13.7 — ABNORMAL HIGH

## 2017-10-02 LAB — MEAN CORPUSCULAR HEMOGLOBIN CONC: Lab: 32.1

## 2017-10-02 LAB — ALBUMIN: Lab: 3.5 — ABNORMAL LOW

## 2017-10-21 NOTE — Unmapped (Signed)
Spoke with Zollie Beckers, caretaker, who stated her PCP has held the medications for the past week. She is to see her PCP tomorrow for an evaluation.  Requested PCP notes be sent to [facility].

## 2017-10-21 NOTE — Unmapped (Signed)
-----   Message from [facility] [place] sent at 10/15/2017 12:27 PM EDT -----  Regarding: labs  Please see if she resumed her medications as outlined in my last message. If so, I want to repeat her labs this week.  She can go to nearest [facility] and have CMP done please for dx: medication monitoring.     Dawn

## 2018-01-18 MED ORDER — RIFAXIMIN 550 MG TABLET
ORAL_TABLET | Freq: Two times a day (BID) | ORAL | 11 refills | 0 days | Status: CP
Start: 2018-01-18 — End: 2018-06-09

## 2018-01-19 NOTE — Unmapped (Signed)
Per test claim for Xifaxan at the [facility], patient needs Medication Assistance Program for Prior Authorization.

## 2018-01-21 NOTE — Unmapped (Signed)
[facility] Medication Referral: [place] Approved      Medication (Brand/Generic): Xifaxan    Final Test Claim completed with resulted information below:    Patient ABLE to fill at [facility]  Insurance Company:  [employer]  Anticipated Copay: $0  Is anticipated copay with a copay card or grant? No     Does patient's insurance plan only allow a 15 day supply for the first 6 fills in the Split Fill Program? No  If yes, inform patient they can request to dis-enroll from the [facility] by calling the patient help desk at N/A.      If the copay is under the $25 defined limit, per policy there will be no further investigation of need for financial assistance at this time unless patient requests. This referral has been communicated to the provider and handed off to the [facility] team for further processing and filling of prescribed medication.   ______________________________________________________________________  Please utilize this referral for viewing purposes as it will serve as the central location for all relevant documentation and updates.

## 2018-01-24 NOTE — Unmapped (Signed)
Patient and her caregiver stated that they are not interested in receiving specialty pharmacy services from [facility] at the [facility].  They stated they have been using the same pharmacy for years and want to stay with them.    Corliss Skains. [place], [place].[facility]  [phone] option 4

## 2018-01-27 NOTE — Unmapped (Signed)
Open in Error. Patient did not show for your OV today.

## 2018-06-02 NOTE — Unmapped (Signed)
Chloe Moore left a message asking if the lactulose could be discontinued. The patient is refusing to take it. She requests dulcolax tablet instead. She says the lactulose is not helping her--per Chloe Moore. I pointed out to him that she missed her last appointment and asked if a phone visit is possible. He said yes. I let him know I will contact Owens Shark and I will get back to him with her response. He verbalizes understanding.

## 2018-06-09 MED ORDER — XIFAXAN 550 MG TABLET
ORAL_TABLET | 6 refills | 0 days | Status: CP
Start: 2018-06-09 — End: 2018-07-11

## 2018-06-15 ENCOUNTER — Encounter: Admit: 2018-06-15 | Discharge: 2018-06-16 | Payer: MEDICARE | Attending: Family | Primary: Family

## 2018-06-15 DIAGNOSIS — K7290 Hepatic failure, unspecified without coma: Principal | ICD-10-CM

## 2018-06-15 DIAGNOSIS — K5909 Other constipation: Secondary | ICD-10-CM

## 2018-06-15 MED ORDER — KRISTALOSE 20 GRAM ORAL PACKET
Freq: Two times a day (BID) | ORAL | 6 refills | 0 days | Status: CP
Start: 2018-06-15 — End: 2018-10-06

## 2018-06-15 MED ORDER — LINZESS 290 MCG CAPSULE
ORAL_CAPSULE | Freq: Every day | ORAL | 6 refills | 0.00000 days | Status: CP
Start: 2018-06-15 — End: 2018-09-05

## 2018-06-15 MED ORDER — DOCUSATE SODIUM 100 MG CAPSULE
ORAL_CAPSULE | Freq: Every evening | ORAL | 6 refills | 0.00000 days | Status: CP
Start: 2018-06-15 — End: ?

## 2018-06-15 NOTE — Unmapped (Signed)
[facility]    Chloe Moore, M.D.  Professor of Medicine  Director, [facility]  [facility] [place]    [phone]    REFERRING PROVIDER: Elisabeth Cara, PA  [address]  [facility]  [place], [place] [address]    PRIMARY CARE PROVIDER: Elisabeth Cara, PA    SUBJECTIVE:     REASON FOR VISIT:  Decompensated cirrhosis (+HE, -ascites, - upper GI bleed) s/p Hepatitis C Treatment with 24 weeks ~CURE    HISTORY OF PRESENT ILLNESS:      Chloe Moore is a 63 y.o. 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 Chief complaint for today's visit is continued cirrhosis care and HE follow up. Visit was conducted telephonic due to COVID-19 action plan. Prior to patient being scheduled for today's visit, our clinic had been notified by her care taker, Zollie Beckers that Chloe Moore had declined receiving her lactulose. She stopped taking it April 22nd. She felt the medication was not working for managing her constipation thus she stopped it.     She continues to reside at [facility] since September 09, 2017. She feels she has been doing better since being at this group home. She has been better adherent with receiving her medications now that her medication have been shorted out and corrected. Initially concern after she moved there. Her mental status has been clearer. Very occasionally does she seemed confused per Zollie Beckers. Zollie Beckers was present during entire visit.     Her biggest complaint is her bowel habit pattern. Her BM pattern varies. Maybe daily with only small movement to possible skipping days. Denies any associated BRBPR. Consistency of stool is hard with abdominal distention and pain which is relieved with movement. She remains on chronic pain medication ~ oxycodone 5 mg bid on average. Prescribed prn. Remains on Xifaxan 550 mg bid.     Complaint of BLE still. She remains on Lasix 20 mg daily along with Aldactone 50 mg daily. She continues to consume diet greater than 2 grams of sodium daily. Bacon/sauage for breakfast with process meat sandwich for lunch with canned soup as examples. Mostly drinking water throughout the day.     Denies nausea, vomiting, fevers, chills, unusual SOB, chest pain, jaundice, rashes, or pruritis. + history of COPD and O2/[place] prn.     Chloe Moore believes she was diagnosed with Hepatitis C in 1997.  At that time, she was treated with a course of ribavirin and peg interferon at [facility], but she does not believe she completed the course due to side effects. She has been successfully treated and cured of Hepatitis C treatment s/p 24 weeks of Harvoni. 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  She also has a history of heavy alcohol use for many years, and she says she drank more than 1 pint of liquor many days of the week.  She quit 3 1/2 years ago, when she moved into an assisted living facility at the urging of her family.      REVIEW OF SYSTEMS:   All systems were reviewed and negative except in HPI.     PAST MEDICAL HISTORY:  Past Medical History:   Diagnosis Date   ??? Acute renal failure (ARF) (CMS-HCC)    ??? Allergic reaction caused by a drug    ??? Asthma    ???  Asthma    ??? Cirrhosis (CMS-HCC)    ??? COPD (chronic obstructive pulmonary disease) (CMS-HCC)    ??? Diabetes mellitus (CMS-HCC)    ??? Hepatitis C    ??? Hypertension    ??? Sinusitis      Liver Care:  -- Chronic hepatitis C, treatment-experienced, genotype 1a ~ successfully treated Harvoni x 24 hours.   -- Fibroscan 03/14/2014:  45 KPa, F4 disease  -- AFP 17.4 (03/14/2014)  -- Immunity to Hepatitis A and Hepatitis B  -- HCC screening:  05/11/2017 CT scan Abd/pelvis with contrast:  Cirrhosis. No focal hepatic abnormality on CT. No biliary ductal dilation. The gallbladder is surgically absent. PANCREAS: Cystic lesions in the pancreas are not well evaluated on this study, although appear grossly similar compared to prior MRI. There is peripancreatic stranding which could be secondary to mesenteric edema /fluid from cirrhosis. Spleen unremarkable. No evidence of ascites.     US Liver doppler 07/28/2017: Portal vein: The main, left and right portal veins are patent with hepatopetal flow. Normal portal vein velocity   (0.20 m/s or greater)  - Splenic vein: Patent, with hepatopetal flow. Hepatic veins/IVC: The IVC, left, middle and right hepatic veins are patent with bi/triphasic waveforms. Hepatic artery: Patent. Visualized proximal aorta is patent    -- Variceal Screening: EGD 2016- Normal esophagus.  + portal hypertensive gastropathy.   -- MELD-Na score: 10 at 10/01/2017  1:40 PM  MELD score: 10 at 10/01/2017  1:40 PM  Calculated from:  Serum Creatinine: 1.10 mg/dL at 2/59/5638  7:56 PM  Serum Sodium: 136 mmol/L at 10/01/2017  1:40 PM  Total Bilirubin: 0.5 mg/dL (Rounded to 1 mg/dL) at 4/33/2951  8:84 PM  INR(ratio): 1.3 at 10/01/2017  1:40 PM  Age: 24 years   Higher score being driven by renal function.   -- OLT candidate - Poor candidate based co morbid conditions     PAST SURGICAL HISTORY:  Past Surgical History:   Procedure Laterality Date   ??? APPENDECTOMY     ??? CARPAL TUNNEL RELEASE     ??? CHOLECYSTECTOMY     ??? HYSTERECTOMY     ??? OOPHORECTOMY     ??? PR COLON CA SCRN NOT HI RSK IND N/A 07/18/2014    Procedure: COLOREC CNCR SCR;COLNSCPY NO;  Surgeon: Bronson Curb, MD;  [facility];  Service: Gastroenterology   ??? PR UPPER GI ENDOSCOPY,DIAGNOSIS N/A 07/18/2014    Procedure: UGI ENDO, INCLUDE ESOPHAGUS, STOMACH, & DUODENUM &/OR JEJUNUM; DX W/WO COLLECTION SPECIMN, BY BRUSH OR WASH;  Surgeon: Bronson Curb, MD;  [facility];  Service: Gastroenterology   ??? REDUCTION MAMMAPLASTY Bilateral      MEDICATIONS:  Current Outpatient Medications   Medication Sig Dispense Refill   ??? acetaminophen (TYLENOL) 500 MG tablet Take 2 tablets by mouth.     ??? albuterol 2.5 mg /3 mL (0.083 %) nebulizer solution Inhale 2.5 mg every six (6) hours as needed.      ??? amLODIPine (NORVASC) 10 MG tablet Take 1 tablet (10 mg total) by mouth daily. 30 tablet 11   ??? aspirin 81 MG chewable tablet Chew 1 tablet (81 mg total) daily. 30 tablet 0   ??? atorvastatin (LIPITOR) 20 MG tablet Take 1 tablet by mouth Continuous.     ??? azelastine (OPTIVAR) 0.05 % ophthalmic solution Administer 1 drop to both eyes two (2) times a day as needed.     ??? blood sugar diagnostic Strp Check blood  sugars six times a day. 250.62 Uncontrolled diabetes, uses insulin.     ??? diaper,brief,adult,disposable (ADULT BRIEF - EXTRA LARGE) Misc Use as directed 90 each 6   ??? diclofenac sodium (VOLTAREN) 1 % gel      ??? escitalopram oxalate (LEXAPRO) 5 MG tablet      ??? fluticasone propion-salmeterol (ADVAIR) 100-50 mcg/dose diskus Inhale 1 puff Two (2) times a day.     ??? furosemide (LASIX) 20 MG tablet Take 1 tablet (20 mg total) by mouth daily. 30 tablet 11   ??? gabapentin (NEURONTIN) 300 MG capsule 300 mg Three (3) times a day.      ??? hydroCHLOROthiazide (HYDRODIURIL) 25 MG tablet Take 1 tablet by mouth Continuous.     ??? insulin glargine (LANTUS) 100 unit/mL injection Inject 20 Units under the skin in the evening. (Patient taking differently: Inject 22 Units under the skin nightly. Inject 20 Units under the skin in the evening.)     ??? lancets Misc Use to check blood sugars six times a day.     ??? linagliptin (TRADJENTA) 5 mg Tab Take 5 mg by mouth daily.     ??? melatonin 3 mg Tab Take 1 tablet by mouth nightly. Taking 5 mg nightly     ??? metFORMIN (GLUCOPHAGE) 500 MG tablet Take 1 tablet (500 mg total) by mouth 2 (two) times a day with meals.     ??? metoprolol tartrate (LOPRESSOR) 100 MG tablet Take 0.5 tablets (50 mg total) by mouth Two (2) times a day. 30 tablet 0   ??? nystatin (MYCOSTATIN) 100,000 unit/gram powder Apply 1 application topically Two (2) times a day. Apply under breasts as directed     ??? oxyCODONE (ROXICODONE) 5 MG immediate release tablet Take 1 tablet by mouth 4 (four) times a day as needed.     ??? pen needle, diabetic 32 gauge x 5/32 Ndle Use to give insulin 6 times a day.     ??? risperiDONE (RISPERDAL) 2 MG tablet Take 0.5 tablets (1 mg total) by mouth nightly. (Patient taking differently: Take 1 mg by mouth daily. )     ??? senna (SENNA) 8.6 mg tablet Take 2 tablets by mouth nightly as needed for constipation. (Patient taking differently: Take 2 tablets by mouth daily. )     ??? spironolactone (ALDACTONE) 50 MG tablet Take 1 tablet (50 mg total) by mouth daily. 30 tablet 11   ??? umeclidinium-vilanterol (ANORO ELLIPTA) 62.5-25 mcg/actuation inhaler Inhale 1 puff.      ??? XIFAXAN 550 mg Tab TAKE 1 TABLET BY MOUTH TWICE A DAY 60 tablet 6   ??? lactulose (CHRONULAC) 10 gram/15 mL solution Take 30 mL (20 g total) by mouth Two (2) times a day. (Patient not taking: Reported on 06/15/2018) 900 mL 6   ??? PNEUMOVAX 23 25 mcg/0.5 mL Syrg injection PHARMACIST ADMINISTERED IMMUNIZATION ADMINISTERED AT TIME OF DISPENSING  0     No current facility-administered medications for this visit.    See scanned MAR.    ALLERGIES:  Lisinopril and Codeine    FAMILY HISTORY:  No family history of liver disease.      SOCIAL HISTORY:  Social History     Tobacco Use   ??? Smoking status: Former Smoker     Last attempt to quit: 02/21/2014     Years since quitting: 4.3   ??? Smokeless tobacco: Never Used   Substance Use Topics   ??? Alcohol use: No     Alcohol/week: 0.0 standard drinks  Comment: Previous heavy alcohol use, on average > 1 pint of liquor per day.     ??? 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   Previous IV drug use in the 1980s.    OBJECTIVE:   VITAL SIGNS and PHYSICAL EXAM:  PE and VS were not obtained today. Both areas were deferred given nature of this encounter being telephonic.     Laboratory results:  Laboratory studies were deferred. Given Ms. Whittenburg's current living arrangement, group home in the setting of COVID-19 epidemic. She is unable to leave facility unless life threatening. Will defer labs until her next face to face visit.     ASSESSMENT AND PLAN:   1.  Decompensated cirrhosis secondary to HCV and alcohol abuse history (+ HE, - ascites, - variceal bleed).    Ms. Gavitt is a very pleasant 63 yo AA female whose chief complaints today are continued cirrhosis care, HE follow up and chronic constipation. ChloeMayon declined the continuation of lactulose as of April 22nd. She did not feel the medication was working as she continued to experience constipation despite taking 30 ml tid. She did not completely understand medication was prescribed more so for management of HE.     Do suspect she is suffering with narcotic induced constipation. She also validated she does not really like the taste of lactulose. Will proceed with switching her lactulose to Kristalose and add Linzess 290 mcg once daily. Hold Senna and start stool softener ~ Colace two capsules daily qhs. She will continue with Xifaxan 550 mg bid.      by high dose of Lyrica. Seen in ED last pm for mental status change. Pending follow up with pain management clinic involving Lyrica dosing. Her fibroscan was consistent with cirrhosis/F4 disease (45 kPa).  There has been a concern that her AFP has been elevated in the past. Her MRIs have not demonstrated any evidence of HCC.     2.  BLE/Ascites: Continue Lasix 20 mg and Aldactone 50 mg daily.  Again stressed the fact she needs to be more adherent with follow less then 2 gm sodium diet daily. Requested Zollie Beckers to assist in her making better choices. No additional salt to be added to her foods. Elevate lower extremities as much as possible.     3. HE -Kristalose bid and Xifaxan 550 mg bid. New RXs sent to her pharmacy.     4. Diabetes mellitus ~ Healthy diet, 30 grams of lean protein with each meal. Strict control very important in setting of cirrhosis. Reviewed lean protein choices.     5. HCC screening: Warranted every six months. Overdue. Will address at time of her phone visit four to six weeks. Unable to safe  5.  History of HCV ~ Prior treatment-experienced with PEG/RVN at [facility] in 1997, genotype 1a. Successfully treated and cured s/p Harvoni x 24 weeks.     6.  Alcohol assessment: No alcohol use. AUDIT ~ zero.     7. Varices Screening- EGD  07/18/14 Normal esophagus. Portal hypertensive gastropathy. Normal examined duodenum. Will address next visit proceeding forward with surveillance.     8.  Abnormal GI findings ~ MRI of abdomen 04/2016 - Stable multiple cystic lesions in the body and tail measuring up to 1.1 cm. Future MRI/MRCP to be ordered in near future.     9.  Hypoxia ~ Remains on O2/2L/ prn.    All patient's and Walter's questions were answered to  their satisfaction during visit.     I spent 43 minutes on the phone with the patient. I spent an additional 15 minutes on pre- and post-visit activities.     The patient was physically located in [place] or a state in which I am permitted to provide care. The patient and/or parent/gauardian understood that s/he may incur co-pays and cost sharing, and agreed to the telemedicine visit. The visit was completed via phone and/or video, which was appropriate and reasonable under the circumstances given the patient's presentation at the time.    The patient and/or parent/guardian has been advised of the potential risks and limitations of this mode of treatment (including, but not limited to, the absence of in-person examination) and has agreed to be treated using telemedicine. The patient's/patient's family's questions regarding telemedicine have been answered.     If the phone/video visit was completed in an ambulatory setting, the patient and/or parent/guardian has also been advised to contact their provider???s office for worsening conditions, and seek emergency medical treatment and/or call 911 if the patient deems either necessary.    Sister ~ Santiago Glad [facility] , Cell # [phone]).      Rodman Key, DNP, FNP-BC  [facility]  [address]  [place] [address]  Phone [phone]

## 2018-07-11 MED ORDER — XIFAXAN 550 MG TABLET
ORAL_TABLET | 10 refills | 0 days | Status: CP
Start: 2018-07-11 — End: 2018-07-13

## 2018-07-13 MED ORDER — RIFAXIMIN 550 MG TABLET
ORAL_TABLET | Freq: Two times a day (BID) | ORAL | 11 refills | 0.00000 days | Status: CP
Start: 2018-07-13 — End: 2018-09-05

## 2018-07-13 NOTE — Unmapped (Signed)
Received notice that the prescription failed to send to the pharmacy. Resending prescription to pharmacy for Xifaxan.

## 2018-09-05 MED ORDER — LINZESS 290 MCG CAPSULE
ORAL_CAPSULE | Freq: Every day | ORAL | 6 refills | 30.00000 days | Status: CP
Start: 2018-09-05 — End: ?

## 2018-09-05 MED ORDER — RIFAXIMIN 550 MG TABLET
ORAL_TABLET | Freq: Two times a day (BID) | ORAL | 11 refills | 31 days | Status: CP
Start: 2018-09-05 — End: ?

## 2018-10-06 ENCOUNTER — Ambulatory Visit: Admit: 2018-10-06 | Discharge: 2018-10-08 | Disposition: A | Discharge status: Home / Self Care | Payer: MEDICARE

## 2018-10-06 ENCOUNTER — Encounter: Admit: 2018-10-06 | Discharge: 2018-10-08 | Disposition: A | Discharge status: Home / Self Care | Payer: MEDICARE

## 2018-10-06 DIAGNOSIS — R1084 Generalized abdominal pain: Principal | ICD-10-CM

## 2018-10-06 LAB — CBC W/ AUTO DIFF
BASOPHILS ABSOLUTE COUNT: 0.1 10*9/L (ref 0.0–0.1)
BASOPHILS RELATIVE PERCENT: 0.8 %
EOSINOPHILS ABSOLUTE COUNT: 0.3 10*9/L (ref 0.0–0.4)
HEMATOCRIT: 39.5 % (ref 36.0–46.0)
HEMOGLOBIN: 12.3 g/dL (ref 12.0–16.0)
LARGE UNSTAINED CELLS: 2 % (ref 0–4)
LYMPHOCYTES ABSOLUTE COUNT: 0.7 10*9/L — ABNORMAL LOW (ref 1.5–5.0)
LYMPHOCYTES RELATIVE PERCENT: 8.2 %
MEAN CORPUSCULAR HEMOGLOBIN CONC: 31.1 g/dL (ref 31.0–37.0)
MEAN CORPUSCULAR HEMOGLOBIN: 26.7 pg (ref 26.0–34.0)
MEAN CORPUSCULAR VOLUME: 85.9 fL (ref 80.0–100.0)
MEAN PLATELET VOLUME: 9.5 fL (ref 7.0–10.0)
MONOCYTES RELATIVE PERCENT: 7.6 %
NEUTROPHILS ABSOLUTE COUNT: 6.4 10*9/L (ref 2.0–7.5)
NEUTROPHILS RELATIVE PERCENT: 78.4 %
PLATELET COUNT: 319 10*9/L (ref 150–440)
RED BLOOD CELL COUNT: 4.6 10*12/L (ref 4.00–5.20)
RED CELL DISTRIBUTION WIDTH: 15 % (ref 12.0–15.0)
WBC ADJUSTED: 8.1 10*9/L (ref 4.5–11.0)

## 2018-10-06 LAB — LIPASE: Triacylglycerol lipase:CCnc:Pt:Ser/Plas:Qn:: 106

## 2018-10-06 LAB — COMPREHENSIVE METABOLIC PANEL
ALBUMIN: 2.8 g/dL — ABNORMAL LOW (ref 3.5–5.0)
ALKALINE PHOSPHATASE: 143 U/L — ABNORMAL HIGH (ref 38–126)
ALT (SGPT): 22 U/L (ref <35)
ANION GAP: 5 mmol/L — ABNORMAL LOW (ref 7–15)
AST (SGOT): 43 U/L — ABNORMAL HIGH (ref 14–38)
BILIRUBIN TOTAL: 0.4 mg/dL (ref 0.0–1.2)
BUN / CREAT RATIO: 12
CALCIUM: 9 mg/dL (ref 8.5–10.2)
CO2: 25 mmol/L (ref 22.0–30.0)
CREATININE: 1 mg/dL (ref 0.60–1.00)
EGFR CKD-EPI AA FEMALE: 69 mL/min/{1.73_m2} (ref >=60)
EGFR CKD-EPI NON-AA FEMALE: 60 mL/min/{1.73_m2} (ref >=60)
GLUCOSE RANDOM: 146 mg/dL (ref 70–179)
POTASSIUM: 4.4 mmol/L (ref 3.5–5.0)
PROTEIN TOTAL: 5.7 g/dL — ABNORMAL LOW (ref 6.5–8.3)
SODIUM: 134 mmol/L — ABNORMAL LOW (ref 135–145)

## 2018-10-06 LAB — NEUTROPHILS RELATIVE PERCENT: Lab: 78.4

## 2018-10-06 LAB — BILIRUBIN TOTAL: Bilirubin:MCnc:Pt:Ser/Plas:Qn:: 0.4

## 2018-10-06 LAB — COVID-19 IGG: SARS coronavirus 2 Ab.IgG:PrThr:Pt:Ser/Plas:Ord:IA: NEGATIVE

## 2018-10-06 NOTE — Unmapped (Signed)
Large Volume Paracentesis Procedure Note (CPT [phone])    Pre-procedural Planning     Patient Name:: Chloe Moore  Patient MRN: [id number]    Indications:  Ascites with abdominal pain    Known Bleeding Diathesis: Patient/caregiver denies any known bleeding or platelet disorder.     Antiplatelet Agents: This patient is not on an antiplatelet agent.    Systemic Anticoagulation: This patient is not on full systemic anticoagulation.    Significant Labs:  INR   Date Value Ref Range Status   10/01/2017 1.3 (H) 0.8 - 1.2 Final     Comment:     Reference interval is for non-anticoagulated patients.  Suggested INR therapeutic range for Vitamin K  antagonist therapy:     Standard Dose (moderate intensity                    therapeutic range):       2.0 - 3.0     Higher intensity therapeutic range       2.5 - 3.5       PT   Date Value Ref Range Status   09/27/2017 14.4 (H) 10.2 - 12.8 sec Final     APTT   Date Value Ref Range Status   05/11/2017 33.2 27.7 - 37.7 sec Final     Platelet   Date Value Ref Range Status   10/06/2018 319 150 - 440 10*9/L Final   10/01/2017 159 150 - 450 x10E3/uL Final       Consent: Informed consent was obtained after explanation of the risks (including bleeding, infection, bowel perforation, and leaking) and benefits of the procedure. Refer to the consent documentation.    Procedure Details     Time-out was performed immediately prior to the procedure.    The head of the bed was placed 45 degrees above level and a large area of ascitic fluid was identified using ultrasound in the left lower quadrant.  The linear probe with color doppler was used to ensure that a vessel was not located in the proposed needle path.    Skin was cleaned with Chlorhexidine. Local anesthesia with 1 percent lidocaine was introduced subcutaneously then deep to the skin until the parietal peritoneum was anesthetized. A small incision was made at the skin site.  A Caldwell needle with a catheter was introduced into this site using the  z-line technique until ascitic fluid was encountered and the catheter was introduced over the needle.  Ascitic fluid and the catheter were removed with minimal bleeding. Dermabond was applied after holding pressure and achieving hemostasis.    Albumin 5%/25% was not administered at the time of paracentesis.     Findings     3 liters of cloudy ascites fluid was obtained.    The ascites fluid was sent for TGs, Cell count and diff, Culture, Albumin and Total Protein.    Condition     The patient tolerated the procedure well and remains in the same condition as pre-procedure.    Complications     None; patient tolerated the procedure well.      Requesting Service: Emergency Medicine    Time Requested: 1000  Time Completed: 1230  Comments: Cloudy fluid, which has been noted on prior procedures and per patient report.    Resident(s) Performing Procedure: Graciella Freer  Resident Year: PGY1     ______________________________________________________  ATTENDING PHYSICIAN    I was present for the key portions of the procedure(s). Loretha Brasil, MD

## 2018-10-06 NOTE — Unmapped (Signed)
[facility]  DIVISION OF INTERVENTIONAL RADIOLOGY CONSULTATION       IR Consultation Note     HPI:  63 y.o. female with history of cirrhosis with hepatic encephalopathy secondary to chronic hepatitis C who presents to the emergency department with abdominal pain and distention.  VIR was consulted for possible diagnostic paracentesis due to concern for spontaneous bacterial peritonitis.  Patient has a history of prior paracenteses, most recent 2 weeks ago.  Per ED resident Dr. Reece Leader, bedside ultrasound demonstrated very small volume ascites in the left lower quadrant for which ED paracentesis risk outweighed benefit.  VIR was consulted to evaluate for image guided paracentesis.    PAST MEDICAL HISTORY:  Past Medical History:   Diagnosis Date   ??? Acute renal failure (ARF) (CMS-HCC)    ??? Allergic reaction caused by a drug    ??? Asthma    ??? Asthma    ??? Cirrhosis (CMS-HCC)    ??? COPD (chronic obstructive pulmonary disease) (CMS-HCC)    ??? Diabetes mellitus (CMS-HCC)    ??? Hepatitis C    ??? Hypertension    ??? Sinusitis        PAST SURGICAL HISTORY:  Past Surgical History:   Procedure Laterality Date   ??? APPENDECTOMY     ??? CARPAL TUNNEL RELEASE     ??? CHOLECYSTECTOMY     ??? HYSTERECTOMY     ??? OOPHORECTOMY     ??? PR COLON CA SCRN NOT HI RSK IND N/A 07/18/2014    Procedure: COLOREC CNCR SCR;COLNSCPY NO;  Surgeon: Bronson Curb, MD;  Location: [facility];  Service: Gastroenterology   ??? PR UPPER GI ENDOSCOPY,DIAGNOSIS N/A 07/18/2014    Procedure: UGI ENDO, INCLUDE ESOPHAGUS, STOMACH, & DUODENUM &/OR JEJUNUM; DX W/WO COLLECTION SPECIMN, BY BRUSH OR WASH;  Surgeon: Bronson Curb, MD;  Location: [facility];  Service: Gastroenterology   ??? REDUCTION MAMMAPLASTY Bilateral        SOCIAL HISTORY:  Social History     Socioeconomic History   ??? Marital status: Single     Spouse name: Not on file   ??? Number of children: Not on file   ??? Years of education: Not on file   ??? Highest education level: Not on file   Occupational History   ??? Not on file   Social Needs   ??? Financial resource strain: Not on file   ??? Food insecurity     Worry: Not on file     Inability: Not on file   ??? Transportation needs     Medical: Not on file     Non-medical: Not on file   Tobacco Use   ??? Smoking status: Former Smoker     Quit date: 02/21/2014     Years since quitting: 4.6   ??? Smokeless tobacco: Never Used   Substance and Sexual Activity   ??? Alcohol use: No     Alcohol/week: 0.0 standard drinks     Comment: Previous heavy alcohol use, on average > 1 pint of liquor per day.     ??? 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   ??? Sexual activity: Never   Lifestyle   ??? Physical activity     Days per week: Not on file     Minutes per session: Not on file   ??? Stress: Not on file   Relationships   ??? Social connections  Talks on phone: Not on file     Gets together: Not on file     Attends religious service: Not on file     Active member of club or organization: Not on file     Attends meetings of clubs or organizations: Not on file     Relationship status: Not on file   Other Topics Concern   ??? Not on file   Social History Narrative    In assisted living        MEDICATIONS:     Current Facility-Administered Medications:   ???  cefTRIAXone (ROCEPHIN) 2 g in sodium chloride 0.9 % (NS) 100 mL IVPB-connector bag, 2 g, Intravenous, Once, Robbi Garter, MD, Last Rate: 200 mL/hr at 10/06/18 1131, 2 g at 10/06/18 1131    Current Outpatient Medications:   ???  acetaminophen (TYLENOL) 500 MG tablet, Take 2 tablets by mouth., Disp: , Rfl:   ???  albuterol 2.5 mg /3 mL (0.083 %) nebulizer solution, Inhale 2.5 mg every six (6) hours as needed. , Disp: , Rfl:   ???  amLODIPine (NORVASC) 10 MG tablet, Take 1 tablet (10 mg total) by mouth daily., Disp: 30 tablet, Rfl: 11  ???  aspirin 81 MG chewable tablet, Chew 1 tablet (81 mg total) daily., Disp: 30 tablet, Rfl: 0  ???  atorvastatin (LIPITOR) 20 MG tablet, Take 1 tablet by mouth Continuous., Disp: , Rfl:   ???  azelastine (OPTIVAR) 0.05 % ophthalmic solution, Administer 1 drop to both eyes two (2) times a day as needed., Disp: , Rfl:   ???  blood sugar diagnostic Strp, Check blood sugars six times a day. 250.62 Uncontrolled diabetes, uses insulin., Disp: , Rfl:   ???  diaper,brief,adult,disposable (ADULT BRIEF - EXTRA LARGE) Misc, Use as directed, Disp: 90 each, Rfl: 6  ???  diclofenac sodium (VOLTAREN) 1 % gel, , Disp: , Rfl:   ???  docusate sodium (COLACE) 100 MG capsule, Take 2 capsules (200 mg total) by mouth nightly. Discontinue Senna and Lactulose ~ instruction to pharmacy staff and patient, Disp: 60 capsule, Rfl: 6  ???  escitalopram oxalate (LEXAPRO) 5 MG tablet, , Disp: , Rfl:   ???  fluticasone propion-salmeterol (ADVAIR) 100-50 mcg/dose diskus, Inhale 1 puff Two (2) times a day., Disp: , Rfl:   ???  furosemide (LASIX) 20 MG tablet, Take 1 tablet (20 mg total) by mouth daily., Disp: 30 tablet, Rfl: 11  ???  gabapentin (NEURONTIN) 300 MG capsule, 300 mg Three (3) times a day. , Disp: , Rfl:   ???  hydroCHLOROthiazide (HYDRODIURIL) 25 MG tablet, Take 1 tablet by mouth Continuous., Disp: , Rfl:   ???  insulin glargine (LANTUS) 100 unit/mL injection, Inject 20 Units under the skin in the evening. (Patient taking differently: Inject 22 Units under the skin nightly. Inject 20 Units under the skin in the evening.), Disp: , Rfl:   ???  lactulose (KRISTALOSE) 20 gram packet, Take 1 packet (20 g total) by mouth Two (2) times a day., Disp: 60 each, Rfl: 6  ???  lancets Misc, Use to check blood sugars six times a day., Disp: , Rfl:   ???  linaCLOtide (LINZESS) 290 mcg capsule, Take 1 capsule (290 mcg total) by mouth daily., Disp: 30 capsule, Rfl: 6  ???  linagliptin (TRADJENTA) 5 mg Tab, Take 5 mg by mouth daily., Disp: , Rfl:   ???  melatonin 3 mg Tab, Take 1 tablet by mouth nightly. Taking 5 mg nightly, Disp: , Rfl:   ???  metFORMIN (GLUCOPHAGE) 500 MG tablet, Take 1 tablet (500 mg total) by mouth 2 (two) times a day with meals., Disp: , Rfl:   ???  metoprolol tartrate (LOPRESSOR) 100 MG tablet, Take 0.5 tablets (50 mg total) by mouth Two (2) times a day., Disp: 30 tablet, Rfl: 0  ???  nystatin (MYCOSTATIN) 100,000 unit/gram powder, Apply 1 application topically Two (2) times a day. Apply under breasts as directed, Disp: , Rfl:   ???  oxyCODONE (ROXICODONE) 5 MG immediate release tablet, Take 1 tablet by mouth 4 (four) times a day as needed., Disp: , Rfl:   ???  pen needle, diabetic 32 gauge x 5/32 Ndle, Use to give insulin 6 times a day., Disp: , Rfl:   ???  PNEUMOVAX 23 25 mcg/0.5 mL Syrg injection, PHARMACIST ADMINISTERED IMMUNIZATION ADMINISTERED AT TIME OF DISPENSING, Disp: , Rfl: 0  ???  rifAXIMin (XIFAXAN) 550 mg Tab, Take 1 tablet (550 mg total) by mouth Two (2) times a day., Disp: 62 tablet, Rfl: 11  ???  risperiDONE (RISPERDAL) 2 MG tablet, Take 0.5 tablets (1 mg total) by mouth nightly. (Patient taking differently: Take 1 mg by mouth daily. ), Disp: , Rfl:   ???  senna (SENNA) 8.6 mg tablet, Take 2 tablets by mouth nightly as needed for constipation. (Patient taking differently: Take 2 tablets by mouth daily. ), Disp: , Rfl:   ???  spironolactone (ALDACTONE) 50 MG tablet, Take 1 tablet (50 mg total) by mouth daily., Disp: 30 tablet, Rfl: 11  ???  umeclidinium-vilanterol (ANORO ELLIPTA) 62.5-25 mcg/actuation inhaler, Inhale 1 puff. , Disp: , Rfl:     ALLERGIES:  Allergies   Allergen Reactions   ??? Lisinopril Swelling     Found out about 2 months ago, had her tongue swollen and hanging out of her mouth per patient report   ??? Codeine        REVIEW OF SYSTEMS:  Review of Systems - Negative except HPI      PHYSICAL EXAM:  Vitals:    10/06/18 0817   BP: 115/61   Pulse: 54   Resp: 16   Temp: 36.7 ??C (98.1 ??F)   SpO2: 95%       ASA Grade: ASA 3 - Patient with moderate systemic disease with functional limitations     Pertinent Labs:     WBC   Date Value Ref Range Status   10/06/2018 8.1 4.5 - 11.0 10*9/L Final   10/01/2017 5.7 3.4 - 10.8 x10E3/uL Final     HGB   Date Value Ref Range Status   10/06/2018 12.3 12.0 - 16.0 g/dL Final   16/11/9602 54.0 11.1 - 15.9 g/dL Final     HCT   Date Value Ref Range Status   10/06/2018 39.5 36.0 - 46.0 % Final   10/01/2017 39.0 34.0 - 46.6 % Final     Platelet   Date Value Ref Range Status   10/06/2018 319 150 - 440 10*9/L Final   10/01/2017 159 150 - 450 x10E3/uL Final     INR   Date Value Ref Range Status   10/01/2017 1.3 (H) 0.8 - 1.2 Final     Comment:     Reference interval is for non-anticoagulated patients.  Suggested INR therapeutic range for Vitamin K  antagonist therapy:     Standard Dose (moderate intensity                    therapeutic range):  2.0 - 3.0     Higher intensity therapeutic range       2.5 - 3.5       Creatinine Whole Blood, POC   Date Value Ref Range Status   08/16/2015 0.9 0.7 - 1.1 mg/dL Final     Creatinine/FP   Date Value Ref Range Status   11/20/2010 0.98 0.60 - 1.00 MG/DL Final     Creatinine   Date Value Ref Range Status   10/06/2018 1.00 0.60 - 1.00 mg/dL Final   08/65/7846 9.62 (H) 0.57 - 1.00 mg/dL Final       Imaging: None relevant           ASSESSMENT & PLAN:     63 y.o. female evaluation for diagnostic paracentesis.  After discussion with the emergency department resident Dr. Reece Leader, given the low volume of fluid seen on bedside ultrasound care.  And the likelihood that procedure would not be performed today due to full VIR schedule and clinical stability of the patient, emergency department feels that the patient would benefit more from admission and empiric antibiotic therapy.  We will plan to proceed without the IR intervention.  Please reconsult VIR if paracentesis is needed in the future.    Belva Crome, MD  [facility] VIR Fellow  October 06, 2018 11:56 AM

## 2018-10-06 NOTE — Unmapped (Signed)
[facility]  Emergency Department Provider Note      ED Clinical Impression     Final diagnoses:   Abdominal distension (Primary)   Generalized abdominal pain       Initial Impression, ED Course, Assessment and Plan     Impression: Generalized abdominal pain and distention    63 year old female history of decompensated cirrhosis (+HE, - ascites, - variceal bleed) secondary to chronic Hepatitis C who presents emergency department with generalized abdominal pain and distention.    BP 115/61  - Pulse 54  - Temp 36.7 ??C (98.1 ??F) (Oral)  - Resp 16  - SpO2 95%     Concerned for SBP. SBP and abdominal pain work-up, including EKG given shortness of breath.  Shortness of breath most likely secondary to abdominal distention respiratory mechanics.    FAST with mild fluid pocket in left lower quadrant, risk of ED tap outweighs benefit.  Will speak with medicine procedure team as well as VIR prior to antibiotics.  Of note, there is some technical difficulties with ultrasound machine and images have not uploaded.  Medicine procedure team would not be able to move the procedure until this afternoon.  VIR would not be able to do the procedure until tomorrow.  We will go ahead and start antibiotics without diagnostic paracentesis for this reason.  MAO was paged.      Medical procedures team was able to find a larger pocket of fluid and perform a large volume paracentesis, removed approximately 3 L.  Respiratory mechanics are improved after this.  EKG was nonischemic.  Patient with 330 nucleated cells, confirming SBP.     1:57 PM  I have spoken with med W team, who has evaluated the patient and plans to admit.    Additional Medical Decision Making     I have reviewed the vital signs and the nursing notes. Labs and radiology results that were available during my care of the patient were independently reviewed by me and considered in my medical decision making.     I staffed the case with the ED attending, Dr. Jorja Loa. Portions of this record have been created using Scientist, clinical (histocompatibility and immunogenetics). Dictation errors have been sought, but may not have been identified and corrected.  ____________________________________________       History     Chief Complaint  Abdominal Problem      HPI   Chloe Moore is a 63 y.o. female with past medical history of decompensated cirrhosis (+HE, - ascites, - variceal bleed) secondary to chronic Hepatitis C who presents emergency department with slowly progressive abdominal distention and generalized discomfort.  Patient reports she has needed large-volume paracentesis twice, once approximately a year ago and again 2 weeks ago in [place].  No fever, however, patient is reporting some night sweats.  No cough or COVID-19 positive contacts.  No vomiting, diarrhea.  Patient stopped taking her lactulose a while ago, decided this on her own.  Reports still taking rifaximin.  Patient reports little bit of shortness of breath secondary to abdominal distention.  No significant increase in lower leg swelling.  No calf tenderness or history of DVT/PE.    Of note, patient was a no-show for her appointment on 01/24/2018 with [facility].      Past Medical History:   Diagnosis Date   ??? Acute renal failure (ARF) (CMS-HCC)    ??? Allergic reaction caused by a drug    ??? Asthma    ??? Asthma    ???  Cirrhosis (CMS-HCC)    ??? COPD (chronic obstructive pulmonary disease) (CMS-HCC)    ??? Diabetes mellitus (CMS-HCC)    ??? Hepatitis C    ??? Hypertension    ??? Sinusitis        Patient Active Problem List   Diagnosis   ??? Injury of kidney   ??? Altered mental status   ??? Hypertension, benign   ??? Bipolar affective disorder (CMS-HCC)   ??? Chronic hepatitis C (CMS-HCC)   ??? Chronic kidney disease   ??? Chronic pain not due to malignancy   ??? Chronic obstructive pulmonary disease (COPD) (CMS-HCC)   ??? Chronic kidney disease, stage III (moderate) (CMS-HCC)   ??? Coagulation disorder (CMS-HCC)   ??? Cocaine abuse (CMS-HCC)   ??? Chronic obstructive pulmonary disease (CMS-HCC)   ??? Dementia (CMS-HCC)   ??? Depressive disorder   ??? Diabetes mellitus (CMS-HCC)   ??? Diabetic neuropathy (CMS-HCC)   ??? Type 2 diabetes mellitus (CMS-HCC)   ??? Esophageal reflux   ??? Fall   ??? Pruritic disorder   ??? Weakness   ??? Gastroesophageal reflux disease   ??? Chronic hepatitis C virus infection (CMS-HCC)   ??? Personal history of traumatic fracture   ??? Hypertension   ??? Hyperkalemia   ??? Hyponatremia   ??? Itching   ??? Adverse reaction to drug   ??? Mild cognitive impairment   ??? Adiposity   ??? Obesity   ??? Idiopathic peripheral neuropathy   ??? Polypharmacy   ??? Thrombocytopenia (CMS-HCC)   ??? Tobacco use disorder   ??? Wrist drop   ??? Chronic viral hepatitis C (CMS-HCC)   ??? Adverse effect of drug   ??? Decreased platelet count (CMS-HCC)   ??? Hepatitis C   ??? COPD (chronic obstructive pulmonary disease) (CMS-HCC)   ??? Asthma   ??? Acute renal failure (ARF) (CMS-HCC)   ??? Other cirrhosis of liver (CMS-HCC)   ??? Sinusitis   ??? Epistaxis, recurrent   ??? Lip lesion   ??? Intermittent exotropia   ??? Diplopia   ??? Ascites       Past Surgical History:   Procedure Laterality Date   ??? APPENDECTOMY     ??? CARPAL TUNNEL RELEASE     ??? CHOLECYSTECTOMY     ??? HYSTERECTOMY     ??? OOPHORECTOMY     ??? PR COLON CA SCRN NOT HI RSK IND N/A 07/18/2014    Procedure: COLOREC CNCR SCR;COLNSCPY NO;  Surgeon: Bronson Curb, MD;  [facility]: [facility];  Service: Gastroenterology   ??? PR UPPER GI ENDOSCOPY,DIAGNOSIS N/A 07/18/2014    Procedure: UGI ENDO, INCLUDE ESOPHAGUS, STOMACH, & DUODENUM &/OR JEJUNUM; DX W/WO COLLECTION SPECIMN, BY BRUSH OR WASH;  Surgeon: Bronson Curb, MD;  Location: [facility];  Service: Gastroenterology   ??? REDUCTION MAMMAPLASTY Bilateral        No current facility-administered medications for this encounter.     Current Outpatient Medications:   ???  acetaminophen (TYLENOL) 500 MG tablet, Take 2 tablets by mouth., Disp: , Rfl:   ???  albuterol 2.5 mg /3 mL (0.083 %) nebulizer solution, Inhale 2.5 mg every six (6) hours as needed. , Disp: , Rfl:   ???  amLODIPine (NORVASC) 10 MG tablet, Take 1 tablet (10 mg total) by mouth daily., Disp: 30 tablet, Rfl: 11  ???  aspirin 81 MG chewable tablet, Chew 1 tablet (81 mg total) daily., Disp: 30 tablet, Rfl: 0  ???  atorvastatin (LIPITOR) 20 MG tablet, Take  1 tablet by mouth Continuous., Disp: , Rfl:   ???  azelastine (OPTIVAR) 0.05 % ophthalmic solution, Administer 1 drop to both eyes two (2) times a day as needed., Disp: , Rfl:   ???  blood sugar diagnostic Strp, Check blood sugars six times a day. 250.62 Uncontrolled diabetes, uses insulin., Disp: , Rfl:   ???  diaper,brief,adult,disposable (ADULT BRIEF - EXTRA LARGE) Misc, Use as directed, Disp: 90 each, Rfl: 6  ???  diclofenac sodium (VOLTAREN) 1 % gel, , Disp: , Rfl:   ???  docusate sodium (COLACE) 100 MG capsule, Take 2 capsules (200 mg total) by mouth nightly. Discontinue Senna and Lactulose ~ instruction to pharmacy staff and patient, Disp: 60 capsule, Rfl: 6  ???  escitalopram oxalate (LEXAPRO) 5 MG tablet, , Disp: , Rfl:   ???  fluticasone propion-salmeterol (ADVAIR) 100-50 mcg/dose diskus, Inhale 1 puff Two (2) times a day., Disp: , Rfl:   ???  furosemide (LASIX) 20 MG tablet, Take 1 tablet (20 mg total) by mouth daily., Disp: 30 tablet, Rfl: 11  ???  gabapentin (NEURONTIN) 300 MG capsule, 300 mg Three (3) times a day. , Disp: , Rfl:   ???  hydroCHLOROthiazide (HYDRODIURIL) 25 MG tablet, Take 1 tablet by mouth Continuous., Disp: , Rfl:   ???  insulin glargine (LANTUS) 100 unit/mL injection, Inject 20 Units under the skin in the evening. (Patient taking differently: Inject 22 Units under the skin nightly. Inject 20 Units under the skin in the evening.), Disp: , Rfl:   ???  lactulose (KRISTALOSE) 20 gram packet, Take 1 packet (20 g total) by mouth Two (2) times a day., Disp: 60 each, Rfl: 6  ???  lancets Misc, Use to check blood sugars six times a day., Disp: , Rfl:   ???  linaCLOtide (LINZESS) 290 mcg capsule, Take 1 capsule (290 mcg total) by mouth daily., Disp: 30 capsule, Rfl: 6  ???  linagliptin (TRADJENTA) 5 mg Tab, Take 5 mg by mouth daily., Disp: , Rfl:   ???  melatonin 3 mg Tab, Take 1 tablet by mouth nightly. Taking 5 mg nightly, Disp: , Rfl:   ???  metFORMIN (GLUCOPHAGE) 500 MG tablet, Take 1 tablet (500 mg total) by mouth 2 (two) times a day with meals., Disp: , Rfl:   ???  metoprolol tartrate (LOPRESSOR) 100 MG tablet, Take 0.5 tablets (50 mg total) by mouth Two (2) times a day., Disp: 30 tablet, Rfl: 0  ???  nystatin (MYCOSTATIN) 100,000 unit/gram powder, Apply 1 application topically Two (2) times a day. Apply under breasts as directed, Disp: , Rfl:   ???  oxyCODONE (ROXICODONE) 5 MG immediate release tablet, Take 1 tablet by mouth 4 (four) times a day as needed., Disp: , Rfl:   ???  pen needle, diabetic 32 gauge x 5/32 Ndle, Use to give insulin 6 times a day., Disp: , Rfl:   ???  PNEUMOVAX 23 25 mcg/0.5 mL Syrg injection, PHARMACIST ADMINISTERED IMMUNIZATION ADMINISTERED AT TIME OF DISPENSING, Disp: , Rfl: 0  ???  rifAXIMin (XIFAXAN) 550 mg Tab, Take 1 tablet (550 mg total) by mouth Two (2) times a day., Disp: 62 tablet, Rfl: 11  ???  risperiDONE (RISPERDAL) 2 MG tablet, Take 0.5 tablets (1 mg total) by mouth nightly. (Patient taking differently: Take 1 mg by mouth daily. ), Disp: , Rfl:   ???  senna (SENNA) 8.6 mg tablet, Take 2 tablets by mouth nightly as needed for constipation. (Patient taking differently: Take 2 tablets by mouth daily. ),  Disp: , Rfl:   ???  spironolactone (ALDACTONE) 50 MG tablet, Take 1 tablet (50 mg total) by mouth daily., Disp: 30 tablet, Rfl: 11  ???  umeclidinium-vilanterol (ANORO ELLIPTA) 62.5-25 mcg/actuation inhaler, Inhale 1 puff. , Disp: , Rfl:     Allergies  Lisinopril and Codeine    Family History   Problem Relation Age of Onset   ??? Breast cancer Sister 6   ??? Diabetes Mother    ??? Cataracts Mother    ??? No Known Problems Father    ??? No Known Problems Daughter    ??? No Known Problems Maternal Grandmother    ??? No Known Problems Maternal Grandfather    ??? No Known Problems Paternal Grandmother    ??? No Known Problems Paternal Grandfather    ??? BRCA 1/2 Neg Hx    ??? Cancer Neg Hx    ??? Colon cancer Neg Hx    ??? Endometrial cancer Neg Hx    ??? Ovarian cancer Neg Hx        Social History  Social History     Tobacco Use   ??? Smoking status: Former Smoker     Quit date: 02/21/2014     Years since quitting: 4.6   ??? Smokeless tobacco: Never Used   Substance Use Topics   ??? Alcohol use: No     Alcohol/week: 0.0 standard drinks     Comment: Previous heavy alcohol use, on average > 1 pint of liquor per day.     ??? 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       Review of Systems:  All other systems have been reviewed and are negative except as otherwise documented in the HPI.      Physical Exam     ED Triage Vitals [10/06/18 0817]   Enc Vitals Group      BP 115/61      Heart Rate 54      SpO2 Pulse 95      Resp 16      Temp 36.7 ??C (98.1 ??F)      Temp Source Oral      SpO2 95 %       Constitutional: In no acute distress.  Eyes: Conjunctivae are normal.  HEENT:       Head: Normocephalic and atraumatic.       Nose: No epistaxis or congestion.       Mouth/Throat: Clear oropharynx. Moist mucous membranes. No stertor.       Neck: full range of motion, no stridor.  Cardiovascular: Regular rate and rhythm. No murmurs, rubs or gallops. 2+ and symmetric radial pulses.  Respiratory: No increased work of breathing. Clear to auscultation bilaterally.  Gastrointestinal: Moderately distended with diffuse tenderness to palpation, endorsing rebound tenderness.  No guarding.  Musculoskeletal: No ecchymosis, cyanosis or edema.  Neurologic:  No asterixis.  Alert and oriented x4.  Normal speech and language. No gross focal neurologic deficits are appreciated.  Skin: Skin is warm, dry and intact. No acute rash noted.  Psychiatric: Mood and affect are normal. Speech and behavior are normal.      EKG     Sinus rhythm, rate approximately 60 bpm.  Normal axis. Intervals within normal limits.  No ST elevation or depression lateral, inferior or anterior leads.  When compared with prior, appears similar, although rate decreased by approximately 25 bpm.    Radiology     ED POCUS FAST  Protocol Emergency                      Robbi Garter, MD  Resident  10/06/18 [phone]

## 2018-10-06 NOTE — Unmapped (Signed)
Internal Medicine (Med W) History and Physical    Assessment/Plan:  Chloe Moore is a 63 y.o. 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     Principal Problem:    Spontaneous bacterial peritonitis (CMS-HCC)  Active Problems:    Hypertension, benign    Chronic hepatitis C (CMS-HCC)    Diabetes mellitus (CMS-HCC)    SBP: gradual progression of constant, dull back and abdominal pain +  unexplained decreased appetite, SoB w/ exertion, chills, and night sweats since having a LVP approx 2 weeks ago. No leukocytosis. Paracentesis showed 330 nucleated cells w/ 23% neutrophils.   - Cefotaxime 2 g IV q12hrs  - CBC QD    Decompensated Cirrhosis: Likely multifactorial in etiology - Hepatitis C (which has since been cured s/p 24 weeks of Harvoni) and heavy alcohol use for many years. Admission labs showed albumin at 2.8, elevated AST to 43, ALT wnl, and Alk Phos elevated to 143. ED POCUS showed a mild amount of free fluid in retrovesicular space, left lower quadrant likely ascites in setting of cirrhosis. Required 2 LVP's in past year.  - CMP, PT/INR QD  - Home Lasix 20 mg QD  - Home Rifaximin 550 mg BID    Chronic Medical Conditions:  - HTN: Amlodipine 10 mg QD, HCTZ 25 mg  - HLD: Atorvastatin 20 mg   - Depression: Escitalopram 5 mg QD  - T2DM: Lingliptin 5 mg QD, Metformin 500 mg BID  - Neuropathy: Gabapentin 300 mg TID, Oxycodone 5 mg QID PRN  - Insomnia: Melatonin 3 mg qPM    Daily Checklist:  Diet: Regular Diet  DVT PPx: SCDs   GI PPx: Not Indicated  Code Status: Full Code   COVID Status: Negative  Dispo: Home   HCPOA: Dorris Billines   ___________________________________________________________________    HPI:  Chloe Moore is a 63 y.o. 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    She reports that she has experienced a gradual progression of constant, dull back and abdominal pain since having a LVP at [place] approximately 2 weeks ago. She believes that she had about 2.5 L of fluid taken off at that time. She rates her pain as a 6/10 currently, and an 8/10 at its worst. She reports that her pain is worse with walking or other activities and improved with laying down. She has not found anything else that seems to improve her pain. She believes it hurts most in the morning. She states that she has only had 2 prior LVP's, the one approximately 2 weeks ago and another about a year before that. In addition to the abdominal pain and discomfort, she also reports unexplained decreased appetite, SoB w/ exertion,  chills, and night sweats over this same duration of time. She denies any recent nausea, vomiting, or diarrhea. She states that she has never had anything like this before. She reports having frequent chills during the day with sweats. RoS positive for blurry vision, headaches, nocturnal cough, increased urinary frequency in addition to the other symptoms mentioned above.      From a brief chart review, she believes she was  diagnosed with Hepatitis C in 1997. 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 She was treated with a course of ribavirin and peg interferon at [facility] at that time, but she does not believe she completed the course due to side effects. She has been successfully treated and cured of Hepatitis C treatment s/p 24 weeks of Harvoni. She also has a history of heavy alcohol use for many years, and she says she drank more than 1 pint of liquor many days of the week.  She quit 3 1/2 years ago, when she moved into an assisted living facility at the urging of her family. It appears that Ms. Bacha lives at the [facility], and has lived here since September 09, 2017.     In the ED, labs were significant for no leukocytosis, slight hyponatremia to 134, hypoproteinemia, elevated AST to 43, Alk Phos elevated to 143. ED POCUS showed a mild amount of free fluid in retrovesicular space, left lower quadrant likely ascites in setting of cirrhosis. Paracentesis was performed in the ED by the Med M team which was significant for 330 nucleated cells w/ 23% neutrophils. Ascitic Cultures were drawn. COVID-19 PCR, COVID-19 IgG, Blood Cx x2, and UA w/ Cx were also ordered. She was diagnosed with SBP and given 2 g of Ceftriaxone IV while in the ED.         Allergies:  Lisinopril and Codeine    Medications:   Prior to Admission medications    Medication Dose, Route, Frequency   atorvastatin (LIPITOR) 20 MG tablet 1 tablet, Oral, continuous TPN without 24 hrs.   escitalopram oxalate (LEXAPRO) 5 MG tablet No dose, route, or frequency recorded.   furosemide (LASIX) 20 MG tablet 20 mg, Oral, Daily (standard)   gabapentin (NEURONTIN) 300 MG capsule 300 mg, 3 times a day (standard)   hydroCHLOROthiazide (HYDRODIURIL) 25 MG tablet 1 tablet, Oral, continuous TPN without 24 hrs.   insulin glargine (LANTUS) 100 unit/mL injection Inject 20 Units under the skin in the evening.  Patient taking differently: Inject 22 Units under the skin nightly. Inject 20 Units under the skin in the evening.   linagliptin (TRADJENTA) 5 mg Tab 5 mg, Oral, Daily (standard)   melatonin 3 mg Tab 1 tablet, Oral, Nightly, Taking 5 mg nightly   metoprolol tartrate (LOPRESSOR) 100 MG tablet 50 mg, Oral, 2 times a day   oxyCODONE (ROXICODONE) 5 MG immediate release tablet 1 tablet, Oral, 4 times daily PRN   rifAXIMin (XIFAXAN) 550 mg Tab 550 mg, Oral, 2 times a day (standard)   acetaminophen (TYLENOL) 500 MG tablet 2 tablets, Oral albuterol 2.5 mg /3 mL (0.083 %) nebulizer solution 2.5 mg, Inhalation, Every 6 hours PRN   amLODIPine (NORVASC) 10 MG tablet 10 mg, Oral, Daily (standard)   aspirin 81 MG chewable tablet 81 mg, Oral, Daily (standard)   azelastine (OPTIVAR) 0.05 % ophthalmic solution 1 drop, Both Eyes, 2 times a day PRN   blood sugar diagnostic Strp Check blood sugars six times a day. 250.62 Uncontrolled diabetes, uses insulin.   diaper,brief,adult,disposable (ADULT BRIEF - EXTRA LARGE) Misc Use as directed   diclofenac sodium (VOLTAREN) 1 % gel No dose, route, or frequency recorded.   docusate sodium (COLACE) 100 MG capsule 200 mg, Oral, Nightly, Discontinue Senna and Lactulose ~ instruction to pharmacy staff and patient   fluticasone propion-salmeterol (ADVAIR) 100-50 mcg/dose diskus 1 puff, Inhalation, 2 times a day (standard)   lancets Misc Use  to check blood sugars six times a day.   linaCLOtide (LINZESS) 290 mcg capsule 290 mcg, Oral, Daily (standard)   metFORMIN (GLUCOPHAGE) 500 MG tablet 500 mg, Oral, 2 times a day with meals   nystatin (MYCOSTATIN) 100,000 unit/gram powder 1 application, Topical, 2 times a day (standard), Apply under breasts as directed    pen needle, diabetic 32 gauge x 5/32 Ndle Use to give insulin 6 times a day.   PNEUMOVAX 23 25 mcg/0.5 mL Syrg injection PHARMACIST ADMINISTERED IMMUNIZATION ADMINISTERED AT TIME OF DISPENSING   senna (SENNA) 8.6 mg tablet 2 tablets, Oral, Nightly PRN  Patient taking differently: Take 2 tablets by mouth daily.    umeclidinium-vilanterol (ANORO ELLIPTA) 62.5-25 mcg/actuation inhaler 1 puff, Inhalation       Medical History:  Past Medical History:   Diagnosis Date   ??? Acute renal failure (ARF) (CMS-HCC)    ??? Allergic reaction caused by a drug    ??? Asthma    ??? Asthma    ??? Cirrhosis (CMS-HCC)    ??? COPD (chronic obstructive pulmonary disease) (CMS-HCC)    ??? Diabetes mellitus (CMS-HCC)    ??? Hepatitis C    ??? Hypertension    ??? Sinusitis        Surgical History:  Past Surgical History:   Procedure Laterality Date   ??? APPENDECTOMY     ??? CARPAL TUNNEL RELEASE     ??? CHOLECYSTECTOMY     ??? HYSTERECTOMY     ??? OOPHORECTOMY     ??? PR COLON CA SCRN NOT HI RSK IND N/A 07/18/2014    Procedure: COLOREC CNCR SCR;COLNSCPY NO;  Surgeon: Bronson Curb, MD;  [facility];  Service: Gastroenterology   ??? PR UPPER GI ENDOSCOPY,DIAGNOSIS N/A 07/18/2014    Procedure: UGI ENDO, INCLUDE ESOPHAGUS, STOMACH, & DUODENUM &/OR JEJUNUM; DX W/WO COLLECTION SPECIMN, BY BRUSH OR WASH;  Surgeon: Bronson Curb, MD;  Location: [facility];  Service: Gastroenterology   ??? REDUCTION MAMMAPLASTY Bilateral        Social History:  Social History     Socioeconomic History   ??? Marital status: Single     Spouse name: Not on file   ??? Number of children: Not on file   ??? Years of education: Not on file   ??? Highest education level: Not on file   Occupational History   ??? Not on file   Social Needs   ??? Financial resource strain: Not on file   ??? Food insecurity     Worry: Never true     Inability: Never true   ??? Transportation needs     Medical: Not on file     Non-medical: Not on file   Tobacco Use   ??? Smoking status: Former Smoker     Quit date: 02/21/2014     Years since quitting: 4.6   ??? Smokeless tobacco: Never Used   Substance and Sexual Activity   ??? Alcohol use: No     Alcohol/week: 0.0 standard drinks     Comment: Previous heavy alcohol use, on average > 1 pint of liquor per day.     ??? 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   ??? Sexual activity: Never   Lifestyle   ??? Physical activity     Days per week: Not on file     Minutes per session: Not on file   ??? Stress: Not  on file   Relationships   ??? Social Wellsite geologist on phone: Not on file     Gets together: Not on file     Attends religious service: Not on file     Active member of club or organization: Not on file     Attends meetings of clubs or organizations: Not on file     Relationship status: Not on file   Other Topics Concern   ??? Not on file   Social History Narrative    In assisted living        Family History:  Family History   Problem Relation Age of Onset   ??? Breast cancer Sister 43   ??? Diabetes Mother    ??? Cataracts Mother    ??? No Known Problems Father    ??? No Known Problems Daughter    ??? No Known Problems Maternal Grandmother    ??? No Known Problems Maternal Grandfather    ??? No Known Problems Paternal Grandmother    ??? No Known Problems Paternal Grandfather    ??? BRCA 1/2 Neg Hx    ??? Cancer Neg Hx    ??? Colon cancer Neg Hx    ??? Endometrial cancer Neg Hx    ??? Ovarian cancer Neg Hx        Review of Systems:  10 systems reviewed and are negative unless otherwise mentioned in HPI    Labs/Studies:  Labs and Studies from the last 24hrs per EMR and Reviewed    Physical Exam:  General: Older AA female laying in bed in NAD, Alert and oriented   HEENT: EOMI, Sclera anicteric.   CV: RRR. Normal S1 and S2. No m/r/g.  Lungs: CTAB, normal WOB on RA.  Abd: Soft, nondistended, and mildly tender only in RLQ/upper pelvic region  Extremities: Warm, no pitting edema, distal pulses 2+ in all 4 extremities   Neuro: No focal neuro deficits appreciated.   Psych: No agitation, anxiety, or depression   Skin: No abnormal rashes, ecchymosis, wounds, or other lesions visualized.     Tenny Craw, MD  [facility] PM&R PGY-1      ATTENDING PHYSICIAN ATTESTATION  I saw and evaluated the patient on this date of arrival, August 27.  I reviewed the findings and plan with the Med W residents.  I agree with the findings and plan as outlined in the above History and Physical Exam Note.  The patient told me that until a few weeks ago, she had not required paracentesis and that she did not have appreciable abdominal distention.  If this is true, then it is reasonable to consider exploring why ascites has appeared relatively recently and quickly.  BPG

## 2018-10-06 NOTE — Unmapped (Signed)
Care Management  Initial Transition Planning Assessment     CM was able to meet with the patient in the ED who agreed to complete the Initial Transition CM assessment.   CM met with patient in pt room.  Pt/visitors were wearing hospital provided masks for the duration of the interaction with CM.   CM was wearing hospital provided surgical mask and hospital provided eye protection.  CM was not within 6 foot of the patient/visitors during this interaction.       The patient's stated compliant in the ED was  Abdominal Problem.  Per ED Provider, 63 year old female history of decompensated cirrhosis (+HE, - ascites, - variceal bleed) secondary to chronic Hepatitis C who presents emergency department with generalized abdominal pain and distention.     CM met with patient at 18-B/18-B in the emergency department. The patient self-rates ADL/IADLS as fair.The patient self-rates ambulation fair.Patient reports that she is not using the following durable medical equipment  . Patient denies  falling in the past two weeks.     General  Care Manager assessed the patient by : In person interview with patient, Medical record review, Discussion with Clinical Care team  Orientation Level: Oriented X4  Who provides care at home?: N/A  Reason for referral: Discharge Planning  Contact/Decision Maker:         Advance Directive (Medical Treatment)  Reason patient does not have an advance directive covering medical treatment:: Patient does not wish to complete one at this time.  Healthcare Decision Maker: Patient does not wish to appoint a Health Care Decision Maker at this time    Advance Directive (Mental Health Treatment)  Does patient have an advance directive covering mental health treatment?: Patient does not have advance directive covering mental health treatment.  Reason patient does not have an advance directive covering mental health treatment:: Patient does not wish to complete one at this time.    Legal NOK/Guardian/POA/AD- Surrogate Decision Makers: At this time the patient is able to make  her own decisions.      Patient Information:    Lives with: Family members(Sister Santiago Glad [phone])    Type of Residence: Mailing Address:  [address] [place]  [place] [address]  Patient reports that the home is 1 level  and 3-4 steps to entrance.   Contacts:   Extended Emergency Contact Information  Primary Emergency Contact: Heide Spark [place]  Home Phone: [phone]  Relation: Sister   Patient Phone Number: [phone] (home)        Medical Provider(s): Elvera Maria, MD  Reason for Admission:   Admitting Diagnosis:  No admission diagnoses are documented for this encounter.  Past Medical History:   has a past medical history of Acute renal failure (ARF) (CMS-HCC), Allergic reaction caused by a drug, Asthma, Asthma, Cirrhosis (CMS-HCC), COPD (chronic obstructive pulmonary disease) (CMS-HCC), Diabetes mellitus (CMS-HCC), Hepatitis C, Hypertension, and Sinusitis.  Past Surgical History:   has a past surgical history that includes Hysterectomy; Appendectomy; Cholecystectomy; Carpal tunnel release; pr upper gi endoscopy,diagnosis (N/A, 07/18/2014); pr colon ca scrn not hi rsk ind (N/A, 07/18/2014); Oophorectomy; and Reduction mammaplasty (Bilateral).   Previous admit date: 10/15/2016    Primary Insurance- Payor: MEDICARE / Plan: MEDICARE PART A AND PART B / Product Type: *No Product type* /   Secondary Insurance ??? Secondary Insurance  MEDICAID [place]  Prescription Coverage ??? Payor: MEDICARE / Plan: MEDICARE PART A AND PART B / Product Type: *No Product  type* /   Preferred Pharmacy - [id number] - [place], [place] - [address]  [facility] [place] [address]  [facility] - [place], [place] - [address]  [id number] - [place], [place] - [address] Transportation home: Private vehicle  Level of function prior to admission: Independent    Type of Residence: Private residence        Location/Detail: [address] [place] [address]    Support Systems: Family Members, Friends/Neighbors    Responsibilities/Dependents at home?: No    Home Care services in place prior to admission?: No          Outpatient/Community Resources in place prior to admission: Clinic  Agency detail (Name/Phone #): Demaura Arminda Resides, MD    Equipment Currently Used at Home: none  Current HME Agency (Name/Phone #): None noted.    Currently receiving outpatient dialysis?: No         Financial Information:          Need for financial assistance?: No       Social Determinants of Health  Social History     Socioeconomic History   ??? Marital status: Single     Spouse name: None   ??? Number of children: None   ??? Years of education: None   ??? Highest education level: None   Occupational History   ??? None   Social Needs   ??? Financial resource strain: None   ??? Food insecurity     Worry: Never true     Inability: Never true   ??? Transportation needs     Medical: None     Non-medical: None   Tobacco Use   ??? Smoking status: Former Smoker     Quit date: 02/21/2014     Years since quitting: 4.6   ??? Smokeless tobacco: Never Used   Substance and Sexual Activity   ??? Alcohol use: No     Alcohol/week: 0.0 standard drinks     Comment: Previous heavy alcohol use, on average > 1 pint of liquor per day.     ??? 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   ??? Sexual activity: Never   Lifestyle   ??? Physical activity     Days per week: None     Minutes per session: None   ??? Stress: None   Relationships   ??? Social Wellsite geologist on phone: None     Gets together: None     Attends religious service: None     Active member of club or organization: None     Attends meetings of clubs or organizations: None     Relationship status: None   Other Topics Concern   ??? None   Social History Narrative    In assisted living      Housing/Utilities   ??? Within the past 12 months, have you ever stayed: outside, in a car, in a tent, in an overnight shelter, or temporarily in someone else's home (i.e. couch-surfing)? No    ??? Are you worried about losing your housing? No    ??? Within the past 12 months, have you been unable to get utilities (heat, electricity) when it was really needed? No      Literacy   ???  How often do you need to have someone help you when you read instructions, pamphlets, or other written material from your doctor or pharmacy? Never        Discharge Needs Assessment:    Concerns to be Addressed: adjustment to diagnosis/illness, care coordination/care conferences, discharge planning    Clinical Risk Factors: Multiple Diagnoses (Chronic), Functional Limitations    Barriers to taking medications: No    Prior overnight hospital stay or ED visit in last 90 days: No    Readmission Within the Last 30 Days: no previous admission in last 30 days    Patient's Choice of Community Agency(s): None noted.    Anticipated Changes Related to Illness: inability to care for self    Equipment Needed After Discharge: none    Discharge Facility/Level of Care Needs: other (see comments)(Home.)    Patient at risk for readmission?: Yes    Readmission  Risk of Unplanned Readmission Score: UNPLANNED READMISSION SCORE: 18%  Predictive Model Details           18% (Medium) Factors Contributing to Score   Calculated 10/06/2018 13:39 21% Diagnosis of drug abuse is present   [facility] Risk of Unplanned Readmission Model 19% Number of active Rx orders is 27     10% Active antipsychotic Rx order is present     10% ECG/EKG order is present in last 6 months     9% Charlson Comorbidity Index is 8     8% Diagnosis of electrolyte disorder is present     7% Imaging order is present in last 6 months     6% Number of ED visits in last six months is 1     Readmitted Within the Last 30 Days? (No if blank)   Patient at risk for readmission?: Yes      Discharge Plan:    Screen findings are: Discharge planning needs identified or anticipated (Comment).    Discussed plan of care with attending RN Amy  and ED prescriber Robbi Garter, MD who are in agreement with the discharge plan.    I have discussed the discharge plan with the Patient, who voiced understanding.  Patient are in agreement with the discharge plan.    Patient will be admitted due to Ascites with abdominal pain.     D/C PLAN:    Screen findings are: Discharge planning needs identified or anticipated (Comment).      CM DC Plan:  Update: CAT completed in ED  [] home  []  CM/SW will continue to follow for avoidable delays and opportunities for progression of care.    Expected Discharge Date:      Expected Transfer from Critical Care: (N/A)    Patient and/or family were provided with choice of facilities / services that are available and appropriate to meet post hospital care needs?: Yes   List choices in order highest to lowest preferred, if applicable. : None noted.    Initial Assessment complete?: Yes      Future Appointments   Date Time Provider Department Center   11/07/2018  8:30 AM Dawn Renato Gails, FNP [facility]         Rosaria Ferries, MSW, [place], [place]  Pager [phone]  October 06, 2018 2:12 PM

## 2018-10-06 NOTE — Unmapped (Signed)
Patient states she feels like there is more fluid build-up in her abdomen. Reports increased pain and swelling. Hx of liver disease and recent therapeutic paracentesis.

## 2018-10-07 DIAGNOSIS — R1084 Generalized abdominal pain: Principal | ICD-10-CM

## 2018-10-07 LAB — URINALYSIS WITH CULTURE REFLEX
BILIRUBIN UA: NEGATIVE
GLUCOSE UA: NEGATIVE
KETONES UA: NEGATIVE
LEUKOCYTE ESTERASE UA: NEGATIVE
NITRITE UA: NEGATIVE
PH UA: 5 (ref 5.0–9.0)
PROTEIN UA: NEGATIVE
RBC UA: <1 /HPF (ref <=4)
SPECIFIC GRAVITY UA: 1.005 (ref 1.003–1.030)
SQUAMOUS EPITHELIAL: <1 /HPF (ref 0–5)
UROBILINOGEN UA: 0.2
WBC UA: <1 /HPF (ref 0–5)

## 2018-10-07 LAB — COMPREHENSIVE METABOLIC PANEL
ALBUMIN: 2.3 g/dL — ABNORMAL LOW (ref 3.5–5.0)
ALKALINE PHOSPHATASE: 132 U/L — ABNORMAL HIGH (ref 38–126)
ALT (SGPT): 18 U/L (ref <35)
ANION GAP: 3 mmol/L — ABNORMAL LOW (ref 7–15)
AST (SGOT): 38 U/L (ref 14–38)
BLOOD UREA NITROGEN: 11 mg/dL (ref 7–21)
BUN / CREAT RATIO: 12
CALCIUM: 8.6 mg/dL (ref 8.5–10.2)
CHLORIDE: 105 mmol/L (ref 98–107)
CO2: 26 mmol/L (ref 22.0–30.0)
CREATININE: 0.91 mg/dL (ref 0.60–1.00)
EGFR CKD-EPI AA FEMALE: 77 mL/min/{1.73_m2} (ref >=60)
EGFR CKD-EPI NON-AA FEMALE: 67 mL/min/{1.73_m2} (ref >=60)
POTASSIUM: 3.9 mmol/L (ref 3.5–5.0)
PROTEIN TOTAL: 4.9 g/dL — ABNORMAL LOW (ref 6.5–8.3)
SODIUM: 134 mmol/L — ABNORMAL LOW (ref 135–145)

## 2018-10-07 LAB — CBC
HEMATOCRIT: 36.1 % (ref 36.0–46.0)
HEMOGLOBIN: 11.4 g/dL — ABNORMAL LOW (ref 12.0–16.0)
MEAN CORPUSCULAR HEMOGLOBIN CONC: 31.4 g/dL (ref 31.0–37.0)
MEAN CORPUSCULAR HEMOGLOBIN: 27 pg (ref 26.0–34.0)
MEAN CORPUSCULAR VOLUME: 85.8 fL (ref 80.0–100.0)
MEAN PLATELET VOLUME: 9.3 fL (ref 7.0–10.0)
PLATELET COUNT: 249 10*9/L (ref 150–440)
RED BLOOD CELL COUNT: 4.21 10*12/L (ref 4.00–5.20)
WBC ADJUSTED: 5 10*9/L (ref 4.5–11.0)

## 2018-10-07 LAB — HEMOGLOBIN A1C
ESTIMATED AVERAGE GLUCOSE: 120 mg/dL
Hemoglobin A1c/Hemoglobin.total:MFr:Pt:Bld:Qn:: 5.8 — ABNORMAL HIGH

## 2018-10-07 LAB — BILIRUBIN UA: Lab: NEGATIVE

## 2018-10-07 LAB — ALT (SGPT): Alanine aminotransferase:CCnc:Pt:Ser/Plas:Qn:: 18

## 2018-10-07 LAB — PROTIME-INR: INR: 1.23

## 2018-10-07 LAB — PROTIME: Lab: 14.2 — ABNORMAL HIGH

## 2018-10-07 LAB — RED BLOOD CELL COUNT: Lab: 4.21

## 2018-10-07 NOTE — Unmapped (Signed)
Patient is alert and oriented x 4. Remained free from falls and injuries during shift. ACHS. Blood sugar managed with correctional insulin (Humalog). Vitals has been stable throughout daty. Plan for discharge after U/S of liver is completed. Did not voice any concerns during shift. No complains of pain. VTE: heparin sq. Plan of care reviewed. Call bell within reach. Instructed to call for assistance when needed.    Problem: Adult Inpatient Plan of Care  Goal: Plan of Care Review  Outcome: Progressing  Flowsheets (Taken 10/07/2018 1251)  Progress: improving  Plan of Care Reviewed With: patient  Goal: Patient-Specific Goal (Individualization)  Outcome: Progressing  Flowsheets (Taken 10/07/2018 1251)  Patient-Specific Goals (Include Timeframe): pt will remain free from falls and injuries during shift  Individualized Care Needs: pain management, ACHS, monitor labs and VS  Anxieties, Fears or Concerns: pt did nto voice any concerns during shift  Goal: Absence of Hospital-Acquired Illness or Injury  Outcome: Progressing  Intervention: Identify and Manage Fall Risk  Flowsheets (Taken 10/07/2018 1251)  Safety Interventions:  ??? lighting adjusted for tasks/safety  ??? low bed  ??? nonskid shoes/slippers when out of bed  Intervention: Prevent Skin Injury  Flowsheets (Taken 10/07/2018 1251)  Pressure Reduction Techniques: frequent weight shift encouraged  Intervention: Prevent VTE (venous thromboembolism)  Flowsheets (Taken 10/07/2018 1251)  VTE Prevention/Management:  ??? anticoagulation therapy  ??? ambulation promoted  Intervention: Prevent Infection  Flowsheets (Taken 10/07/2018 1251)  Infection Prevention: rest/sleep promoted  Goal: Optimal Comfort and Wellbeing  Outcome: Progressing  Intervention: Monitor Pain and Promote Comfort  Flowsheets (Taken 10/07/2018 1251)  Pain Management Interventions:  ??? quiet environment facilitated  ??? care clustered  Intervention: Provide Person-Centered Care  Flowsheets (Taken 10/07/2018 1251)  Trust Relationship/Rapport:  ??? questions answered  ??? questions encouraged  ??? thoughts/feelings acknowledged  Goal: Readiness for Transition of Care  Outcome: Progressing  Goal: Rounds/Family Conference  Outcome: Progressing     Problem: Pain Chronic (Persistent) (Comorbidity Management)  Goal: Acceptable Pain Control and Functional Ability  Outcome: Progressing  Intervention: Develop Pain Management Plan  Flowsheets (Taken 10/07/2018 1251)  Pain Management Interventions:  ??? quiet environment facilitated  ??? care clustered  Intervention: Manage Persistent Pain  Flowsheets (Taken 10/07/2018 1251)  Sleep/Rest Enhancement:  ??? awakenings minimized  ??? regular sleep/rest pattern promoted  ??? noise level reduced  Intervention: Optimize Psychosocial Wellbeing  Flowsheets (Taken 10/07/2018 1251)  Supportive Measures: active listening utilized     Problem: Infection  Goal: Infection Symptom Resolution  Outcome: Progressing  Intervention: Prevent or Manage Infection  Flowsheets (Taken 10/07/2018 1251)  Infection Management: aseptic technique maintained  Fever Reduction/Comfort Measures: fluid intake increased     Problem: Venous Thromboembolism  Goal: VTE (Venous Thromboembolism) Symptom Resolution  Outcome: Progressing  Intervention: Prevent or Manage VTE (Venous Thromboembolism)  Flowsheets (Taken 10/07/2018 1251)  VTE Prevention/Management:  ??? anticoagulation therapy  ??? ambulation promoted

## 2018-10-07 NOTE — Unmapped (Signed)
Pt is admitted here abdominal distention and discomfort. Pt is A&Ox4, RA, afebrile and VSS during this shift. Complained of abd pain and PRN oxycodone was given with good relief. Slept well. Received heparin SQ for VTE prophylaxis. Monitor BG and no sliding insulin was given. Will monitor.    Completed skin checked with Richardean Canal, RN. Skin is intact.     Problem: Adult Inpatient Plan of Care  Goal: Plan of Care Review  Outcome: Progressing  Flowsheets (Taken 10/07/2018 0234)  Progress: improving  Plan of Care Reviewed With: patient  Goal: Patient-Specific Goal (Individualization)  Outcome: Progressing  Flowsheets (Taken 10/07/2018 0234)  Patient-Specific Goals (Include Timeframe): Pt will report adequate pain control during this shift 7p-7a  Individualized Care Needs: Pain management, ACHS, monitor labs and VS  Anxieties, Fears or Concerns: Pt voiced no concerns during this shift     Problem: Pain Chronic (Persistent) (Comorbidity Management)  Goal: Acceptable Pain Control and Functional Ability  Outcome: Progressing  Intervention: Develop Pain Management Plan  Flowsheets (Taken 10/07/2018 0234)  Pain Management Interventions:   care clustered   quiet environment facilitated   pain management plan reviewed with patient/caregiver  Intervention: Manage Persistent Pain  Flowsheets (Taken 10/07/2018 0234)  Sleep/Rest Enhancement:   awakenings minimized   regular sleep/rest pattern promoted   noise level reduced  Intervention: Optimize Psychosocial Wellbeing  Flowsheets (Taken 10/07/2018 0234)  Supportive Measures: active listening utilized     Problem: Infection  Goal: Infection Symptom Resolution  Outcome: Progressing  Intervention: Prevent or Manage Infection  Flowsheets (Taken 10/07/2018 0234)  Fever Reduction/Comfort Measures:   fluid intake increased   medication administered     Problem: Venous Thromboembolism  Goal: VTE (Venous Thromboembolism) Symptom Resolution  Outcome: Progressing  Intervention: Prevent or Manage VTE (Venous Thromboembolism)  Flowsheets (Taken 10/07/2018 0234)  Bleeding Precautions: blood pressure closely monitored  VTE Prevention/Management: ambulation promoted

## 2018-10-07 NOTE — Unmapped (Signed)
[facility] Procedure Follow up    Assessment/Plan:    Principal Problem:    Spontaneous bacterial peritonitis (CMS-HCC)  Active Problems:    Hypertension, benign    Chronic hepatitis C (CMS-HCC)    Diabetes mellitus (CMS-HCC)      Chloe Moore is a 63 y.o. y/o female that presents to [facility] with Spontaneous bacterial peritonitis (CMS-HCC).    Patient seen in follow up after therapeutic paracentesis performed yesterday. I reviewed the studies from our procedure. They are significant for cell count not consistent with infection. SAAG c/w portal HTN, ascitic protein c/w cirrhosis .  No immediate complications.    Will sign off, please call [facility] at [phone] for further issues, procedures.    ___________________________________________________________________    Subjective:  Doing well today, feeling better after fluid removal. No bleeding, pain, or leakage of fluid at paracentesis site.     ROS:      Objective:    Vital signs in last 24 hours:  Temp:  [36.6 ??C (97.9 ??F)-36.8 ??C (98.2 ??F)] 36.8 ??C (98.2 ??F)  Heart Rate:  [61-71] 61  SpO2 Pulse:  [56] 56  Resp:  [16-18] 16  BP: (109-138)/(59-71) 125/66  MAP (mmHg):  [77-85] 84  SpO2:  [93 %-100 %] 100 %  BMI (Calculated):  [33.35] 33.35    Physical Exam:  Gen: resting comfortably in bed, non-toxic, NAD  HEENT: Sclera anicteric, wearing mask  Resp: Normal WOB, no accessory muscle use  Abd: Soft, nontender, paracentesis site c/d/i    Labs/Studies:  Labs/Studies reviewed by me.  Notable for values below.  Cell count with 693 RBCs, 330 nucleated cells with 23% PMNs  Albumin < 1  Protein < 2

## 2018-10-08 MED ORDER — ASPIRIN 81 MG CHEWABLE TABLET
ORAL_TABLET | Freq: Every day | ORAL | 0 refills | 30.00000 days | Status: CP
Start: 2018-10-08 — End: 2018-11-07

## 2018-10-08 MED ORDER — FUROSEMIDE 20 MG TABLET
ORAL_TABLET | Freq: Every day | ORAL | 6 refills | 30.00000 days | Status: CP
Start: 2018-10-08 — End: 2018-11-07

## 2018-10-08 MED ORDER — DICLOFENAC 1 % TOPICAL GEL
Freq: Every day | TOPICAL | 0 refills | 50.00000 days | Status: CP | PRN
Start: 2018-10-08 — End: 2019-06-26

## 2018-10-08 MED ORDER — AMLODIPINE 10 MG TABLET
ORAL_TABLET | Freq: Every day | ORAL | 11 refills | 30.00000 days | Status: CP
Start: 2018-10-08 — End: 2019-10-08

## 2018-10-08 MED ORDER — METOPROLOL TARTRATE 100 MG TABLET
ORAL_TABLET | Freq: Two times a day (BID) | ORAL | 0 refills | 30.00000 days | Status: CP
Start: 2018-10-08 — End: 2018-11-07

## 2018-10-08 MED ORDER — SPIRONOLACTONE 50 MG TABLET
ORAL_TABLET | Freq: Every day | ORAL | 3 refills | 60.00000 days | Status: CP
Start: 2018-10-08 — End: 2018-11-07

## 2018-10-08 MED ORDER — ACETAMINOPHEN 500 MG TABLET
ORAL_TABLET | ORAL | 0 refills | 3.00000 days | Status: CP | PRN
Start: 2018-10-08 — End: ?

## 2018-10-08 NOTE — Unmapped (Signed)
Alert and oriented x4. Patient is self care. Had pain rated 7/10 to BLE relieved with prn oxycodone. Humalog administered per sliding scale. No other concerns voiced. Plan to discharge home in the morning. Currently in bed with eyes closed.  Problem: Adult Inpatient Plan of Care  Goal: Plan of Care Review  Outcome: Progressing  Flowsheets (Taken 10/08/2018 0144)  Progress: improving  Plan of Care Reviewed With: patient  Goal: Patient-Specific Goal (Individualization)  Outcome: Progressing  Flowsheets (Taken 10/08/2018 0144)  Patient-Specific Goals (Include Timeframe): Free from any injuries this admission  Individualized Care Needs: monitor labs and vital signs  Anxieties, Fears or Concerns: None voiced  Goal: Absence of Hospital-Acquired Illness or Injury  Outcome: Progressing  Goal: Optimal Comfort and Wellbeing  Outcome: Progressing  Goal: Readiness for Transition of Care  Outcome: Progressing  Goal: Rounds/Family Conference  Outcome: Progressing     Problem: Pain Chronic (Persistent) (Comorbidity Management)  Goal: Acceptable Pain Control and Functional Ability  Outcome: Progressing     Problem: Infection  Goal: Infection Symptom Resolution  Outcome: Progressing     Problem: Venous Thromboembolism  Goal: VTE (Venous Thromboembolism) Symptom Resolution  Outcome: Progressing     Problem: Fall Injury Risk  Goal: Absence of Fall and Fall-Related Injury  Outcome: Progressing

## 2018-10-08 NOTE — Unmapped (Signed)
Physician Discharge Summary [facility]  [address] [facility]  [address]  [place] [address]  Dept: [phone]  Loc: [phone]     Identifying Information:   Chloe Moore  12-Nov-1955  [id number]    Code Status:   Orders Placed This Encounter   Procedures   ??? Full Code     Standing Status:   Standing     Number of Occurrences:   1       Admit Date: 10/06/2018    Discharge Date: 10/08/2018     Discharge To: Home    Discharge Service: Med Candie Echevaria (MDW)    Discharge Attending Physician: Donnal Moat, MD    Discharge Diagnoses:  Principal Problem:    Spontaneous bacterial peritonitis (CMS-HCC)  Active Problems:    Hypertension, benign    Chronic hepatitis C (CMS-HCC)    Diabetes mellitus (CMS-HCC)  Resolved Problems:    * No resolved hospital problems. *      Outpatient Provider Follow Up Issues:   Please follow-up repeat BMP as we doubled her home diuretics to Lasix to 40 mg QD and spironolactone 50 mg QD due to more rapidly accumulating ascites with abdominal pain and discomfort.     Hospital Course:   Chloe Moore is a 63 y.o. 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  ??  She reports that she has experienced a gradual progression of constant, dull back and abdominal pain since having a LVP at [place] approximately 2 weeks ago. She believes that she had about 2.5 L of fluid taken off at that time. She rates her pain as a 6/10 currently, and an 8/10 at its worst. She reports that her pain is worse with walking or other activities and improved with laying down. She has not found anything else that seems to improve her pain. She believes it hurts most in the morning. She states that she has only had 2 prior LVP's, the one approximately 2 weeks ago and another about a year before that. In addition to the abdominal pain and discomfort, she also reports unexplained decreased appetite, SoB w/ exertion,  chills, and night sweats over this same duration of time. She denies any recent nausea, vomiting, or diarrhea. She states that she has never had anything like this before. She reports having frequent chills during the day with sweats. RoS positive for blurry vision, headaches, nocturnal cough, increased urinary frequency in addition to the other symptoms mentioned above.    ??  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 She was treated with a course of ribavirin and peg interferon at [facility] at that time, but she does not believe she completed the course due to side effects. She has been successfully treated and cured of Hepatitis C treatment s/p 24 weeks of Harvoni. She also has a history of heavy alcohol use for many years, and she says she drank more than 1 pint of liquor many days of the week. ??She quit 3 1/2 years ago, when she moved into an assisted living facility at the urging of her family. It appears that [name] lives at the [facility], and has lived here since September 09, 2017.   ??  In the ED, labs were significant for no leukocytosis, slight hyponatremia to 134, hypoproteinemia, elevated AST to 43, Alk Phos elevated to 143. ED POCUS showed a mild amount of free fluid in retrovesicular space, left lower quadrant likely ascites in setting of cirrhosis. Paracentesis was performed in the ED by the Med M team which was significant for 330 nucleated cells w/ 23% neutrophils. Ascitic Cultures were drawn. COVID-19 PCR, COVID-19 IgG, Blood Cx x2, and UA w/ Cx were also ordered. She was diagnosed with SBP and given 2 g of Ceftriaxone IV while in the ED.     Decompensated Cirrhosis with ascites: Cirrhosis likely multifactorial in etiology - Hepatitis C (which has since been cured s/p 24 weeks of Harvoni) and heavy alcohol use for many years.??Admission labs showed albumin at 2.8, elevated AST to 43, ALT wnl, and Alk Phos elevated to 143. She underwent LVP with studies in the ED with 3L cloudy fluid removed. She was started on CTX for SBP but cell count was not consistent with this (<250 PMNs). Patient concerned about fluid reaccumulation despite paracentesis. US Liver Doppler showed: Echogenic liver with coarse echotexture compatible with diffuse hepatocellular disease; Patent hepatic vasculature with normal flow direction; Trace ascites. Ascitic Culture drawn on 8/27 showed NGTD w/ no PMN's or organisms seen on Gram Stain.   - CMP, PT/INR QD  - Doubled home diuretics: home Lasix to 40 mg QD, home spironolactone to 50mg  daily  - Continue home Rifaximin 550 mg BID  ????  SBP - does not meet diagnostic criteria: gradual progression of constant, dull back and abdominal pain +  unexplained decreased appetite, SoB w/ exertion, chills, and night sweats since having a LVP approx 2 weeks ago. No leukocytosis. Paracentesis showed 330 nucleated cells w/ 23% neutrophils (=> <250 PMN's) . Given 2 g of Ceftriaxone IV while in the ED and 2 g IV once while on the floor before ABx were discontinued.  - CBC QD    Chronic Medical Conditions:  - HTN: Amlodipine 10 mg QD, HCTZ 25 mg  - HLD: Atorvastatin 20 mg   - Depression: Escitalopram 5 mg QD  - T2DM: holding home Lingliptin and Metformin; restart on discharge. DISCONTINUE patient's insulin on discharge given good blood sugars here and report of lows in the 50s at home.  - Neuropathy: Gabapentin 300 mg TID, Oxycodone 5 mg QID PRN  - Insomnia: Melatonin 3 mg qPM    Procedures:  paracentesis  No admission procedures for hospital encounter.  ______________________________________________________________________  Discharge Medications:     Your Medication List      STOP taking these medications    insulin glargine 100 unit/mL injection  Commonly known as: LANTUS        CHANGE how you take these medications    acetaminophen 500 MG tablet  Commonly known as: TYLENOL  Take 2 tablets (1,000 mg total) by mouth every four (4) hours as needed for pain.  What changed:   ?? when to take this  ?? reasons to take this     diclofenac sodium 1 % gel  Commonly known as: VOLTAREN  Apply 2 g topically daily as needed for arthritis.  What changed:   ?? how much to take  ?? how to take this  ?? when to take this  ?? reasons to take this     furosemide 20 MG tablet  Commonly known as: LASIX  Take 2 tablets (40 mg total) by mouth daily.  What changed: how much to take     metoprolol tartrate 100 MG tablet  Commonly known as: LOPRESSOR  Take 0.5 tablets (50 mg total) by mouth Two (2) times a day.  What changed: when to take this     senna 8.6 mg tablet  Commonly known as: SENNA  Take 2 tablets by mouth nightly as needed for constipation.  What changed: when to take this     spironolactone 50 MG tablet  Commonly known as: ALDACTONE  Take 1 tablet (50 mg total) by mouth daily.  What changed:   ?? medication strength  ?? how much to take        CONTINUE taking these medications    albuterol 2.5 mg /3 mL (0.083 %) nebulizer solution  Inhale 2.5 mg every six (6) hours as needed.     amLODIPine 10 MG tablet  Commonly known as: NORVASC  Take 1 tablet (10 mg total) by mouth daily.     ANORO ELLIPTA 62.5-25 mcg/actuation inhaler  Generic drug: umeclidinium-vilanteroL  Inhale 1 puff.     aspirin 81 MG chewable tablet  Chew 1 tablet (81 mg total) daily.     atorvastatin 20 MG tablet  Commonly known as: LIPITOR  Take 1 tablet by mouth Continuous.     azelastine 0.05 % ophthalmic solution  Commonly known as: OPTIVAR  Administer 1 drop to both eyes two (2) times a day as needed.     blood sugar diagnostic Strp  Check blood sugars six times a day. 250.62 Uncontrolled diabetes, uses insulin.     diaper,brief,adult,disposable Misc  Commonly known as: ADULT BRIEF - EXTRA LARGE  Use as directed     docusate sodium 100 MG capsule  Commonly known as: COLACE  Take 2 capsules (200 mg total) by mouth nightly. Discontinue Senna and Lactulose ~ instruction to pharmacy staff and patient     escitalopram oxalate 5 MG tablet  Commonly known as: LEXAPRO     fluticasone propion-salmeteroL 100-50 mcg/dose diskus  Commonly known as: ADVAIR  Inhale 1 puff Two (2) times a day.     gabapentin 300 MG capsule  Commonly known as: NEURONTIN  300 mg Three (3) times a day.     hydroCHLOROthiazide 25 MG tablet  Commonly known as: HYDRODIURIL  Take 1 tablet by mouth Continuous.     lancets Misc  Use to check blood sugars six times a day.     LINZESS 290 mcg capsule  Generic drug: linaCLOtide  Take 1 capsule (290 mcg total) by mouth daily.     melatonin 3 mg Tab  Take 1 tablet by mouth nightly. Taking 5 mg nightly     metFORMIN 500 MG tablet  Commonly known as: GLUCOPHAGE  Take 1 tablet (500 mg total) by mouth 2 (two) times a day with meals.     nystatin 100,000 unit/gram powder  Commonly known as: MYCOSTATIN  Apply 1 application topically Two (2) times a day. Apply under breasts as directed     oxyCODONE 5 MG immediate release tablet  Commonly known as: ROXICODONE  Take 1 tablet by mouth 4 (four) times a day as needed.     pen needle, diabetic 32 gauge x 5/32 (4 mm) Ndle  Use to give insulin 6 times a day.     PNEUMOVAX-23 25 mcg/0.5 mL Syrg injection  Generic drug: pneumococcal 23-val ps vaccine  PHARMACIST ADMINISTERED IMMUNIZATION ADMINISTERED AT TIME OF DISPENSING     rifAXIMin 550 mg Tab  Commonly known as: XIFAXAN  Take 1 tablet (550 mg total) by mouth Two (2) times a day.     TRADJENTA 5 mg Tab  Generic drug: linaGLIPtin  Take 5 mg by mouth daily.            Allergies:  Lisinopril and Codeine  ______________________________________________________________________  Pending Test Results (if blank, then none):  Pending Labs     Order Current Status    ED Extra Tubes In process    PINK EXTRA TUBE In process    Ascitic/Peritoneal Fluid Culture Preliminary result    Blood Culture #1 Preliminary result    Blood Culture #2 Preliminary result          Most Recent Labs:  All lab results last 24 hours -   Recent Results (from the past 24 hour(s))   POCT Glucose    Collection Time: 10/07/18 11:35 AM   Result Value Ref Range    Glucose, POC 166 70 - 179 mg/dL   POCT Glucose    Collection Time: 10/07/18  4:25 PM   Result Value Ref Range    Glucose, POC 181 (H) 70 - 179 mg/dL   POCT Glucose    Collection Time: 10/07/18 10:00 PM   Result Value Ref Range    Glucose, POC 164 70 - 179 mg/dL   POCT Glucose    Collection Time: 10/08/18  8:21 AM   Result Value Ref Range    Glucose, POC 112 70 - 179 mg/dL       Relevant Studies/Radiology (if blank, then none):  US Liver Doppler    Result Date: 10/07/2018  EXAM: US LIVER DOPPLER DATE: 10/07/2018 4:00 PM ACCESSION: [id number] UN DICTATED: 10/07/2018 4:00 PM INTERPRETATION LOCATION: [facility] CLINICAL INDICATION: 63 year old Female with diuretic refractory ascites, assess hepatic blood flow. TECHNIQUE: Ultrasound views of the complete abdomen were obtained using gray scale and color and spectral Doppler imaging. COMPARISON: Doppler ultrasound dated 07/28/2017. FINDINGS: LIVER: The liver is mildly echogenic with coarse echotexture. No focal hepatic lesions. No intrahepatic biliary ductal dilatation. The common bile duct is normal in caliber. GALLBLADDER: The gallbladder is surgically absent. PANCREAS: Visualized portion is unremarkable. SPLEEN: Normal in size and echotexture. KIDNEYS: Normal in size and echotexture. No solid masses or calculi. No hydronephrosis. VESSELS - Portal vein: The main, left and right portal veins are patent with hepatopetal flow. Normal main portal vein velocity (0.20 m/s or greater) - Splenic vein: Patent, with hepatopetal flow. - Hepatic veins/IVC: The IVC, left, middle and right hepatic veins are patent with bi/triphasic waveforms. - Hepatic artery: Patent - Visualized proximal aorta:  unremarkable OTHER: Trace perihepatic and perisplenic fluid.     Echogenic liver with coarse echotexture compatible with diffuse hepatocellular disease. Patent hepatic vasculature with normal flow direction. Trace ascites. Please see below for data measurements: Liver: 15.85 cm Common bile duct: 0.38 cm Gallbladder wall: SURG ABSENT Right kidney length: 10.22 cm Left kidney length: 10.57 cm Main portal vein diameter: 1.25 cm Main portal vein velocity: 0.274 m/s Anterior right portal vein velocity: 0.230 m/s Posterior right portal vein velocity: 0.153 m/s Left portal vein velocity: 0.188 m/s Main portal vein flow: hepatopetal Right portal vein flow: hepatopetal Left portal vein flow: hepatopetal Common hepatic artery: Patent Left hepatic vein flow: biphasic Middle hepatic vein flow: triphasic Right hepatic vein flow: triphasic Inferior vena cava flow: triphasic Splenic vein midline: hepatopetal Splenic vein proximal: hepatopetal Aorta: partially visualized Inferior vena cava: partially visualized Spleen: 12.23 cm Abdominal free fluid  visualized: yes SURG ABSENT    Ed Pocus Fast Protocol Emergency    Result Date: 10/06/2018  Of note, there were technical difficulties uploading the images of the study. An EPIC order for point-of-care ultrasound FAST was placed via epic.  I used the [id number] machine, currently station in trauma bay 2.  The help number listed on machine is 4???0185.  Prior to exam, I queried the patient's name, selected her and opened her study.  Her name was on the left side of the ultrasound screen at that time.  I saved images and video clips during my exam and ended the exam.  I plugged the machine back into an electric source. No images were available on the study review tab once I sat back down at my computer.  So I went back to the machine and hit review, -- the study I had just done was not saved under the patient's name, but seemed to be saved under (_No_Name_). Unable to manually transfer the images to the study. Limited Focused Assessment with Sonography for Trauma (FAST) Ultrasound (CPT: 16109-60 (abdomen) + 45409-81 (cardiac) + 19147-82 (chest)) Indication: A focused ultrasound exam of the peritoneal space (including the following areas sub-phrenic, Morison???s pouch, splenorenal, superior colic gutters, and retro-vesicular), pericardial space, and pleural spaces was performed to evaluate for free fluid. The ultrasound was performed with the following indications, as noted in the H&P: abdominal pain and distension. Identified structures: The heart, diaphragms, liver, spleen, kidneys, and bladder were identified and the spaces noted above were examined. Findings: Exam of the above structures revealed the following findings in the peritoneal, pericardial, and pleural spaces: Evaluation for free fluid in: Morison's pouch: Absent Spenorenal fossa: Absent Retrovesicular space: Present Other findings: Minimal fluid in right lower quadrant, small pockets of fluid in between parietal peritoneum and bowel and left lower quadrant. Limitations: None. Impression: Mild amount of Free fluid in retrovesicular space, left lower quadrant likely ascites in setting of cirrhosis. Interpreted by: Robbi Garter, MD     ______________________________________________________________________  Discharge Instructions:   Activity Instructions     Activity as tolerated                Other Instructions     Call MD for:  persistent nausea or vomiting      Call MD for:  redness, tenderness, or signs of infection (pain, swelling, redness, odor or green/yellow discharge around incision site)      Call MD for: Temperature > 38.5 Celsius ( > 101.3 Fahrenheit)      Discharge instructions      You were admitted to [facility] for increased abdominal fullness due to ascites. We took off fluid with a large volume paracentesis (LVP) and increased your fluid pills.    You are now ready for discharge home. You will need to continue your lasix and spironolactone at a higher dose going forward.    We have made the following changes to your medications:  -- INCREASE lasix to 40mg  daily  -- INCREASE spironolactone to 50mg  daily  -- STOP insulin. You can continue your oral diabetes medications, but your blood sugar was fairly normal while here and insulin could drop it to dangerously low levels.    Please take all your other medications as prescribed.     Follow up after a hospitalization is very important.   -- Please follow up with your primary care provider within the next 2-7 days. You will need to have repeat lab tests (BMP, Magnesium,  Phosphorus) since we increased your diuretics which can sometimes cause you to lose electrolytes in your urine.  -- You should also follow up with your liver doctor.    It was a pleasure getting to be a part of your care team and we wish you the very best.               Follow Up instructions and Outpatient Referrals     Call MD for:  persistent nausea or vomiting      Call MD for:  redness, tenderness, or signs of infection (pain, swelling, redness, odor or green/yellow discharge around incision site)      Call MD for: Temperature > 38.5 Celsius ( > 101.3 Fahrenheit)      Discharge instructions            Appointments which have been scheduled for you    Nov 07, 2018  8:30 AM  (Arrive by 8:00 AM)  RETURN VIRAL HEPATITIS with Fawn Kirk, FNP  [facility]) [address]  [place] [address]  [phone]           ______________________________________________________________________  Discharge Day Services:  BP 122/68  - Pulse 72  - Temp 36.8 ??C (Oral)  - Resp 17  - Ht 170.2 cm (5' 7)  - Wt 96.6 kg (213 lb)  - SpO2 92%  - BMI 33.36 kg/m??   Pt seen on the day of discharge and determined appropriate for discharge.    Condition at Discharge: fair    Length of Discharge: I spent greater than 30 mins in the discharge of this patient.    Tenny Craw, MD  [facility] PM&R PGY-1

## 2018-10-08 NOTE — Unmapped (Addendum)
Chloe Moore is a 63 y.o. 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  ??  She reports that she has experienced a gradual progression of constant, dull back and abdominal pain since having a LVP at [place] approximately 2 weeks ago. She believes that she had about 2.5 L of fluid taken off at that time. She rates her pain as a 6/10 currently, and an 8/10 at its worst. She reports that her pain is worse with walking or other activities and improved with laying down. She has not found anything else that seems to improve her pain. She believes it hurts most in the morning. She states that she has only had 2 prior LVP's, the one approximately 2 weeks ago and another about a year before that. In addition to the abdominal pain and discomfort, she also reports unexplained decreased appetite, SoB w/ exertion,  chills, and night sweats over this same duration of time. She denies any recent nausea, vomiting, or diarrhea. She states that she has never had anything like this before. She reports having frequent chills during the day with sweats. RoS positive for blurry vision, headaches, nocturnal cough, increased urinary frequency in addition to the other symptoms mentioned above.    ??  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 She was treated with a course of ribavirin and peg interferon at [facility] at that time, but she does not believe she completed the course due to side effects. She has been successfully treated and cured of Hepatitis C treatment s/p 24 weeks of Harvoni. She also has a history of heavy alcohol use for many years, and she says she drank more than 1 pint of liquor many days of the week. ??She quit 3 1/2 years ago, when she moved into an assisted living facility at the urging of her family. It appears that [name] lives at the [facility], and has lived here since September 09, 2017.   ??  In the ED, labs were significant for no leukocytosis, slight hyponatremia to 134, hypoproteinemia, elevated AST to 43, Alk Phos elevated to 143. ED POCUS showed a mild amount of free fluid in retrovesicular space, left lower quadrant likely ascites in setting of cirrhosis. Paracentesis was performed in the ED by the Med M team which was significant for 330 nucleated cells w/ 23% neutrophils. Ascitic Cultures were drawn. COVID-19 PCR, COVID-19 IgG, Blood Cx x2, and UA w/ Cx were also ordered. She was diagnosed with SBP and given 2 g of Ceftriaxone IV while in the ED.     Decompensated Cirrhosis with ascites: Cirrhosis likely multifactorial in etiology - Hepatitis C (which has since been cured s/p 24 weeks of Harvoni) and heavy alcohol use for many years.??Admission labs showed albumin at 2.8, elevated AST to 43, ALT wnl, and Alk Phos elevated to 143. She underwent LVP with studies in the ED with 3L cloudy fluid removed. She was started on CTX for SBP but cell count was not consistent with this (<250 PMNs). Patient concerned about fluid reaccumulation despite paracentesis. US Liver Doppler showed: Echogenic liver with coarse echotexture compatible with diffuse hepatocellular disease; Patent hepatic vasculature with normal flow direction; Trace ascites. Ascitic Culture  drawn on 8/27 showed NGTD w/ no PMN's or organisms seen on Gram Stain.   - CMP, PT/INR QD  - Doubled home diuretics: home Lasix to 40 mg QD, home spironolactone to 50mg  daily  - Continue home Rifaximin 550 mg BID  ????  SBP - does not meet diagnostic criteria: gradual progression of constant, dull back and abdominal pain +  unexplained decreased appetite, SoB w/ exertion, chills, and night sweats since having a LVP approx 2 weeks ago. No leukocytosis. Paracentesis showed 330 nucleated cells w/ 23% neutrophils (=> <250 PMN's) . Given 2 g of Ceftriaxone IV while in the ED and 2 g IV once while on the floor before ABx were discontinued.  - CBC QD    Chronic Medical Conditions:  - HTN: Amlodipine 10 mg QD, HCTZ 25 mg  - HLD: Atorvastatin 20 mg   - Depression: Escitalopram 5 mg QD  - T2DM: holding home Lingliptin and Metformin; restart on discharge. DISCONTINUE patient's insulin on discharge given good blood sugars here and report of lows in the 50s at home.  - Neuropathy: Gabapentin 300 mg TID, Oxycodone 5 mg QID PRN  - Insomnia: Melatonin 3 mg qPM

## 2018-10-08 NOTE — Unmapped (Signed)
Daily Progress Note    24hr Events/Subjective: No acute events overnight.  Patient describes mild tenderness on the right side of her abdomen.  She underwent liver Doppler today to evaluate for possible reasons for rapid accumulation of fluid when this has not been a problem for her previously.    Assessment/Plan:    Chloe Moore is a 63 y.o. 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     Principal Problem:    Spontaneous bacterial peritonitis (CMS-HCC)  Active Problems:    Hypertension, benign    Chronic hepatitis C (CMS-HCC)    Diabetes mellitus (CMS-HCC)  Resolved Problems:    * No resolved hospital problems. *      Decompensated Cirrhosis with ascites: Cirrhosis likely multifactorial in etiology - Hepatitis C (which has since been cured s/p 24 weeks of Harvoni) and heavy alcohol use for many years. Admission labs showed albumin at 2.8, elevated AST to 43, ALT wnl, and Alk Phos elevated to 143. She underwent LVP with studies in the ED with 3L cloudy fluid removed. She was started on CTX for SBP but cell count was not consistent with this (<250 PMNs). Patient concerned about fluid reaccumulation despite paracentesis.  - CMP, PT/INR QD  - Follow up liver doppler read  - Follow up ascitic culture  - Doubled home diuretics: home Lasix to 40 mg QD, home spironolactone to 50mg  daily  - continue home Rifaximin 550 mg BID  ??  Chronic Medical Conditions:  - HTN: Amlodipine 10 mg QD, HCTZ 25 mg  - HLD: Atorvastatin 20 mg   - Depression: Escitalopram 5 mg QD  - T2DM: holding home Lingliptin and Metformin; restart on discharge. DISCONTINUE patient's insulin on discharge given good blood sugars here and report of lows in the 50s at home.  - Neuropathy: Gabapentin 300 mg TID, Oxycodone 5 mg QID PRN  - Insomnia: Melatonin 3 mg qPM  ??  Daily Checklist:  Diet: Regular Diet  DVT PPx: SCDs   GI PPx: Not Indicated  Code Status: Full Code   COVID Status: Negative  Dispo: Home   HCPOA: Dorris Billines    ___________________________________________________________________    Labs/Studies:  Labs and Studies from the last 24hrs per EMR and Reviewed                 Objective:  Temp:  [36.6 ??C-36.8 ??C] 36.8 ??C  Heart Rate:  [61-71] 61  Resp:  [16-18] 16  BP: (124-138)/(59-66) 125/66  SpO2:  [93 %-100 %] 100 %    General: Well appearing, lying comfortably in bed, conversational and pleasant, in no acute distress  Cardiovascular: RRR, no murmurs appreciated, trace lower extremity edema  Pulmonary: Normal work of breathing on room air, clear to auscultation bilaterally, no wheezes or crackles, no use of accessory muscles  Abdominal: soft, slightly distended, nontender, normoactive bowel sounds  Psychologic: Normal mood and affect

## 2018-11-07 ENCOUNTER — Ambulatory Visit: Admit: 2018-11-07 | Discharge: 2018-11-07 | Payer: MEDICARE | Attending: Family | Primary: Family

## 2018-11-07 DIAGNOSIS — F1010 Alcohol abuse, uncomplicated: Secondary | ICD-10-CM

## 2018-11-07 DIAGNOSIS — Z87891 Personal history of nicotine dependence: Secondary | ICD-10-CM

## 2018-11-07 DIAGNOSIS — I10 Essential (primary) hypertension: Secondary | ICD-10-CM

## 2018-11-07 DIAGNOSIS — R102 Pelvic and perineal pain: Secondary | ICD-10-CM

## 2018-11-07 DIAGNOSIS — K5909 Other constipation: Secondary | ICD-10-CM

## 2018-11-07 DIAGNOSIS — Z23 Encounter for immunization: Secondary | ICD-10-CM

## 2018-11-07 DIAGNOSIS — Z5181 Encounter for therapeutic drug level monitoring: Secondary | ICD-10-CM

## 2018-11-07 DIAGNOSIS — R1084 Generalized abdominal pain: Secondary | ICD-10-CM

## 2018-11-07 DIAGNOSIS — R634 Abnormal weight loss: Secondary | ICD-10-CM

## 2018-11-07 DIAGNOSIS — Z8619 Personal history of other infectious and parasitic diseases: Secondary | ICD-10-CM

## 2018-11-07 DIAGNOSIS — E119 Type 2 diabetes mellitus without complications: Secondary | ICD-10-CM

## 2018-11-07 DIAGNOSIS — J449 Chronic obstructive pulmonary disease, unspecified: Secondary | ICD-10-CM

## 2018-11-07 DIAGNOSIS — K7460 Unspecified cirrhosis of liver: Secondary | ICD-10-CM

## 2018-11-07 DIAGNOSIS — Z7982 Long term (current) use of aspirin: Secondary | ICD-10-CM

## 2018-11-07 DIAGNOSIS — B1920 Unspecified viral hepatitis C without hepatic coma: Secondary | ICD-10-CM

## 2018-11-07 DIAGNOSIS — Z6832 Body mass index (BMI) 32.0-32.9, adult: Secondary | ICD-10-CM

## 2018-11-07 DIAGNOSIS — R188 Other ascites: Secondary | ICD-10-CM

## 2018-11-07 DIAGNOSIS — G9340 Encephalopathy, unspecified: Secondary | ICD-10-CM

## 2018-11-07 DIAGNOSIS — K7290 Hepatic failure, unspecified without coma: Secondary | ICD-10-CM

## 2018-11-07 DIAGNOSIS — Z79899 Other long term (current) drug therapy: Secondary | ICD-10-CM

## 2018-11-07 DIAGNOSIS — Z9049 Acquired absence of other specified parts of digestive tract: Secondary | ICD-10-CM

## 2018-11-07 DIAGNOSIS — R110 Nausea: Secondary | ICD-10-CM

## 2018-11-07 DIAGNOSIS — Z7984 Long term (current) use of oral hypoglycemic drugs: Secondary | ICD-10-CM

## 2018-11-07 DIAGNOSIS — Z885 Allergy status to narcotic agent status: Secondary | ICD-10-CM

## 2018-11-07 LAB — BASIC METABOLIC PANEL
ANION GAP: 9 mmol/L (ref 7–15)
BLOOD UREA NITROGEN: 14 mg/dL (ref 7–21)
BUN / CREAT RATIO: 10
CHLORIDE: 98 mmol/L (ref 98–107)
CO2: 27 mmol/L (ref 22.0–30.0)
CREATININE: 1.36 mg/dL — ABNORMAL HIGH (ref 0.60–1.00)
EGFR CKD-EPI AA FEMALE: 47 mL/min/{1.73_m2} — ABNORMAL LOW (ref >=60)
EGFR CKD-EPI NON-AA FEMALE: 41 mL/min/{1.73_m2} — ABNORMAL LOW (ref >=60)
GLUCOSE RANDOM: 148 mg/dL (ref 70–179)
POTASSIUM: 4.5 mmol/L (ref 3.5–5.0)
SODIUM: 134 mmol/L — ABNORMAL LOW (ref 135–145)

## 2018-11-07 LAB — CBC W/ AUTO DIFF
BASOPHILS ABSOLUTE COUNT: 0.1 10*9/L (ref 0.0–0.1)
BASOPHILS RELATIVE PERCENT: 1.8 %
EOSINOPHILS ABSOLUTE COUNT: 0.2 10*9/L (ref 0.0–0.4)
EOSINOPHILS RELATIVE PERCENT: 3.1 %
HEMATOCRIT: 43.2 % (ref 36.0–46.0)
HEMOGLOBIN: 13.5 g/dL (ref 12.0–16.0)
LARGE UNSTAINED CELLS: 3 % (ref 0–4)
LYMPHOCYTES ABSOLUTE COUNT: 0.8 10*9/L — ABNORMAL LOW (ref 1.5–5.0)
MEAN CORPUSCULAR HEMOGLOBIN CONC: 31.2 g/dL (ref 31.0–37.0)
MEAN CORPUSCULAR HEMOGLOBIN: 26.5 pg (ref 26.0–34.0)
MEAN CORPUSCULAR VOLUME: 85 fL (ref 80.0–100.0)
MEAN PLATELET VOLUME: 10 fL (ref 7.0–10.0)
MONOCYTES RELATIVE PERCENT: 8.2 %
NEUTROPHILS ABSOLUTE COUNT: 5.7 10*9/L (ref 2.0–7.5)
NEUTROPHILS RELATIVE PERCENT: 74.1 %
PLATELET COUNT: 263 10*9/L (ref 150–440)
RED BLOOD CELL COUNT: 5.08 10*12/L (ref 4.00–5.20)
WBC ADJUSTED: 7.7 10*9/L (ref 4.5–11.0)

## 2018-11-07 LAB — SMEAR REVIEW

## 2018-11-07 LAB — AFP-TUMOR MARKER: Alpha-1-Fetoprotein.tumor marker:MCnc:Pt:Ser/Plas:Qn:: 9 — ABNORMAL HIGH

## 2018-11-07 LAB — URINALYSIS
BILIRUBIN UA: NEGATIVE
BLOOD UA: NEGATIVE
GLUCOSE UA: NEGATIVE
HYALINE CASTS: 9 /LPF — ABNORMAL HIGH (ref 0–1)
KETONES UA: NEGATIVE
LEUKOCYTE ESTERASE UA: NEGATIVE
NITRITE UA: NEGATIVE
PH UA: 5 (ref 5.0–9.0)
PROTEIN UA: NEGATIVE
RBC UA: 1 /HPF (ref <=4)
SPECIFIC GRAVITY UA: 1.006 (ref 1.003–1.030)
SQUAMOUS EPITHELIAL: 1 /HPF (ref 0–5)
UROBILINOGEN UA: 0.2
WBC UA: <1 /HPF (ref 0–5)

## 2018-11-07 LAB — SODIUM URINE: Lab: 113

## 2018-11-07 LAB — HEPATIC FUNCTION PANEL
ALBUMIN: 3.8 g/dL (ref 3.5–5.0)
ALKALINE PHOSPHATASE: 163 U/L — ABNORMAL HIGH (ref 38–126)
ALT (SGPT): 33 U/L (ref <35)
BILIRUBIN DIRECT: 0.2 mg/dL (ref 0.00–0.40)
BILIRUBIN TOTAL: 0.6 mg/dL (ref 0.0–1.2)
PROTEIN TOTAL: 7 g/dL (ref 6.5–8.3)

## 2018-11-07 LAB — PROTIME-INR: PROTIME: 13.8 s — ABNORMAL HIGH (ref 10.2–13.1)

## 2018-11-07 LAB — SODIUM: Sodium:SCnc:Pt:Ser/Plas:Qn:: 134 — ABNORMAL LOW

## 2018-11-07 LAB — LEUKOCYTE ESTERASE UA: Leukocyte esterase:PrThr:Pt:Urine:Ord:Test strip: NEGATIVE

## 2018-11-07 LAB — PROTIME: Coagulation tissue factor induced:Time:Pt:PPP:Qn:Coag: 13.8 — ABNORMAL HIGH

## 2018-11-07 LAB — ALBUMIN: Albumin:MCnc:Pt:Ser/Plas:Qn:: 3.8

## 2018-11-07 LAB — MEAN CORPUSCULAR HEMOGLOBIN: Lab: 26.5

## 2018-11-07 MED ORDER — RIFAXIMIN 550 MG TABLET: 550 mg | tablet | Freq: Two times a day (BID) | 6 refills | 30 days | Status: AC

## 2018-11-07 MED ORDER — LACTULOSE 10 GRAM/15 ML ORAL SOLUTION
Freq: Every day | ORAL | 6 refills | 30 days | Status: CP
Start: 2018-11-07 — End: ?

## 2018-11-07 MED ORDER — RIFAXIMIN 550 MG TABLET
ORAL_TABLET | Freq: Two times a day (BID) | ORAL | 6 refills | 30.00000 days | Status: CP
Start: 2018-11-07 — End: 2018-11-07

## 2018-11-07 NOTE — Unmapped (Addendum)
1.  Laboratory studies ordered today.   2. CT scan of abdomen and pelvis ordered for evaluation of abdominal pain.   3. Continue taking Linzess 290 mg one capsule daily.   4. Start taking Lactulose 30 ml by mouth daily. Monitor your bowel habit pattern. Goal of two to three movements daily (good movements). Okay to stop taking MOM.  5. Continue Lasix 40 mg daily along with Spironolactone 50 mg daily.   6. Low sodium diet, less than 2,000 mg total daily. Please follow patient education information given.   7. Diet high in lean protein. Goal of 30 grams of lean protein with each meal and high protein snack at bedtime. Okay to drink Premier Protein drinks as a meal or in addition if you are unable to get enough protein with each meal.   8. No alcohol use.   9. Congratulations on quitting smoking.   10.  Order placed for upper endoscopy for variceal screening.   11. Office follow up in three months.   12. Continue Xifaxan 550 mg twice daily.     Patient Education        Learning About High-Protein Foods  What foods are high in protein?    The foods you eat contain nutrients, such as vitamins and minerals. Protein is a nutrient. Your body needs the right amount to stay healthy and work as it should. You can use the list below to help you make choices about which foods to eat.  Here are some examples of foods that are high in protein.  Dairy and dairy alternatives  ?? Cheese  ?? Milk  ?? Soy milk  ?? Yogurt (especially [place])  Meat  ?? Beef  ?? Chicken  ?? Ham  ?? Lamb  ?? Lunch meat  ?? Pork  ?? Sausage  ?? [place]  Other protein foods  ?? Beans (black, garbanzo, kidney, lima)  ?? Eggs  ?? Hummus  ?? Lentils  ?? Nuts  ?? Peanut butter and other nut butters  ?? Peas  ?? Soybeans  ?? Tofu  ?? Veggie or soy patty (Check the nutrition label for the amount of protein in each serving.)  Seafood  ?? Anchovies  ?? Cod  ?? Crab  ?? Halibut  ?? Salmon  ?? Sardines  ?? Shrimp  ?? Tilapia  ?? Trout  ?? Tuna  Protein supplements  ?? Bars (Check the nutrition label for the amount of protein in each serving.)  ?? Drinks  ?? Powders  Work with your doctor to find out how much of this nutrient you need. Depending on your health, you may need more or less of it in your diet.  Where can you learn more?  Go to [facility] at [email or web address]  Select Patient Education under [employer]. Enter P335 in the search box to learn more about Learning About High-Protein Foods.  Current as of: September 30, 2017??????????????????????????????Content Version: 12.6  ?? 2006-2020 Healthwise, Inc[employer].   Care instructions adapted under license by [facility]. If you have questions about a medical condition or this instruction, always ask your healthcare professional. Healthwise, Incorporated disclaims any warranty or liability for your use of this information.

## 2018-11-07 NOTE — Unmapped (Signed)
NEW Xifaxan prescription but it is refill too soon until 11/22/2018. Will retest 11/22/2018.

## 2018-11-07 NOTE — Unmapped (Signed)
[facility]    [name], M.D.  Professor of Medicine  Director, [facility]  [place] [facility] [place]    [phone]    REFERRING PROVIDER: Elisabeth Cara, PA  [address]  [facility]  [place],  [place] [address]    PRIMARY CARE PROVIDER: Elvera Maria, MD    SUBJECTIVE:     REASON FOR VISIT:  Decompensated cirrhosis (+HE, -ascites, - upper GI bleed) s/p Hepatitis C Treatment with 24 weeks ~CURE    HISTORY OF PRESENT ILLNESS:      Chloe Moore is a 63 y.o. female (DOB: 1955/09/01) with history of decompensated cirrhosis (+HE, + ascites, - variceal bleed) secondary to chronic Hepatitis C, genotype 1a. Patient has not been seen in clinic since pandemic. Virtual visit was conducted Jun 15, 2018. Following this visit, she has been hospitalized twice for ascites. Most recent hospitalization was October 06, 2018. during this admission she received her third LVP. Per patient her PCP has been ordering LVPs prn. LVP result from recent admission:   Cell count with 693 RBCs, 330 nucleated cells with 23% PMNs  Albumin < 1  Protein < 2    Initially she was diagnosed with SBP but after review of results felt to not be the case. Antibiotic treatment was stopped during hospitalization and not discharged home on prophylaxis. Review of her labs has persistently revealed low albumin level recently. She does not strive to consume diet high in lean protein or strictly adhere to low sodium diet (still eating process meats, canned soup, and excessive salad dressing as examples).     She is currently living with her sister. She moved in with her ~ 2 months ago. She feels she is doing better living with her sister. She helps to make sure she takes her medications correctly. She is off lactulose but remains on Xifaxan 550 mg bid, but states she has difficulty in getting the medication since hospitalization.     Weight loss from 223 lbs down to 205 lbs. Unintentional weight loss. I just do not have an appetite. Also complaint of generalized abdominal pain with intermittent occurrences of nausea. Denies vomiting, fevers, chills, jaundice, pruritis, chest pain or difficulty sleeping. + SOB at times. Under care of pulmonary clinic with [facility]. Diagnosed with COPD. On Chantix and no tobacco use for past three months. Denies any alcohol use for the past 7 years, but discrepancy noted with last OV being 3.5 years. Able to stop insulin. Blood sugars were dropping into the 50's. Remains on metformin.     Ms. Cihlar believes she was diagnosed with Hepatitis C in 1997. At that time, she was treated with a course of ribavirin and peg interferon at [facility], but she does not believe she completed the course due to side effects. She has been successfully treated and cured of Hepatitis C treatment s/p 24 weeks of Harvoni. 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  She also has a history of heavy alcohol use for many years, and she says she drank more than 1 pint of liquor many days of the week.     REVIEW OF SYSTEMS:   All systems were reviewed and negative except in HPI.     PAST MEDICAL HISTORY:  Past Medical History:   Diagnosis Date   ??? Acute renal failure (ARF) (CMS-HCC)    ??? Allergic reaction caused by a drug    ??? Asthma    ???  Asthma    ??? Cirrhosis (CMS-HCC)    ??? COPD (chronic obstructive pulmonary disease) (CMS-HCC)    ??? Diabetes mellitus (CMS-HCC)    ??? Hepatitis C    ??? Hypertension    ??? Sinusitis      Liver Care:  -- Chronic hepatitis C, treatment-experienced, genotype 1a ~ successfully treated Harvoni x 24 hours.   -- Fibroscan 03/14/2014:  45 KPa, F4 disease  -- AFP 17.4 (03/14/2014)  -- Immunity to Hepatitis A and Hepatitis B  -- HCC screening:  US Liver doppler 10/07/2018:   Echogenic liver with coarse echotexture compatible with diffuse hepatocellular disease.  ??Patent hepatic vasculature with normal flow direction. Trace ascites.    -- Variceal Screening: EGD 2016- Normal esophagus.  + portal hypertensive gastropathy.   -- MELD-Na score: 8 at 10/07/2018  7:49 AM  MELD score: 8 at 10/07/2018  7:49 AM  Calculated from:  Serum Creatinine: 0.91 mg/dL (Rounded to 1 mg/dL) at 10/24/7827  5:62 AM  Serum Sodium: 134 mmol/L at 10/07/2018  7:49 AM  Total Bilirubin: 0.4 mg/dL (Rounded to 1 mg/dL) at 03/11/8655  8:46 AM  INR(ratio): 1.23 at 10/07/2018  7:49 AM  Age: 30 years 8 months   Higher score being driven by renal function.     -- OLT candidate - Poor candidate based co morbid conditions     PAST SURGICAL HISTORY:  Past Surgical History:   Procedure Laterality Date   ??? APPENDECTOMY     ??? CARPAL TUNNEL RELEASE     ??? CHOLECYSTECTOMY     ??? HYSTERECTOMY     ??? OOPHORECTOMY     ??? PR COLON CA SCRN NOT HI RSK IND N/A 07/18/2014    Procedure: COLOREC CNCR SCR;COLNSCPY NO;  Surgeon: Bronson Curb, MD;  [facility];  Service: Gastroenterology   ??? PR UPPER GI ENDOSCOPY,DIAGNOSIS N/A 07/18/2014    Procedure: UGI ENDO, INCLUDE ESOPHAGUS, STOMACH, & DUODENUM &/OR JEJUNUM; DX W/WO COLLECTION SPECIMN, BY BRUSH OR WASH;  Surgeon: Bronson Curb, MD;  [facility]: [facility];  Service: Gastroenterology   ??? REDUCTION MAMMAPLASTY Bilateral      MEDICATIONS:  Current Outpatient Medications   Medication Sig Dispense Refill   ??? albuterol 2.5 mg /3 mL (0.083 %) nebulizer solution Inhale 2.5 mg every six (6) hours as needed.      ??? amLODIPine (NORVASC) 10 MG tablet Take 1 tablet (10 mg total) by mouth daily. 30 tablet 11   ??? aspirin 81 MG chewable tablet Chew 1 tablet (81 mg total) daily. 30 tablet 0   ??? atorvastatin (LIPITOR) 20 MG tablet Take 1 tablet by mouth Continuous.     ??? blood sugar diagnostic Strp Check blood sugars six times a day. 250.62 Uncontrolled diabetes, uses insulin.     ??? diaper,brief,adult,disposable (ADULT BRIEF - EXTRA LARGE) Misc Use as directed 90 each 6   ??? diclofenac sodium (VOLTAREN) 1 % gel Apply 2 g topically daily as needed for arthritis. 100 g 0   ??? escitalopram oxalate (LEXAPRO) 5 MG tablet      ??? furosemide (LASIX) 20 MG tablet Take 2 tablets (40 mg total) by mouth daily. 60 tablet 6   ??? gabapentin (NEURONTIN) 300 MG capsule 300 mg Three (3) times a day.      ??? hydroCHLOROthiazide (HYDRODIURIL) 25 MG tablet Take 1 tablet by mouth Continuous.     ??? lancets Misc Use to check blood sugars six times a day.     ???  linaCLOtide (LINZESS) 290 mcg capsule Take 1 capsule (290 mcg total) by mouth daily. 30 capsule 6   ??? linagliptin (TRADJENTA) 5 mg Tab Take 5 mg by mouth daily.     ??? metFORMIN (GLUCOPHAGE) 500 MG tablet Take 1 tablet (500 mg total) by mouth 2 (two) times a day with meals.     ??? metoprolol tartrate (LOPRESSOR) 100 MG tablet Take 0.5 tablets (50 mg total) by mouth Two (2) times a day. 30 tablet 0   ??? oxyCODONE (ROXICODONE) 5 MG immediate release tablet Take 1 tablet by mouth 4 (four) times a day as needed.     ??? pen needle, diabetic 32 gauge x 5/32 Ndle Use to give insulin 6 times a day.     ??? PNEUMOVAX 23 25 mcg/0.5 mL Syrg injection PHARMACIST ADMINISTERED IMMUNIZATION ADMINISTERED AT TIME OF DISPENSING  0   ??? spironolactone (ALDACTONE) 50 MG tablet Take 1 tablet (50 mg total) by mouth daily. 60 tablet 3   ??? umeclidinium-vilanterol (ANORO ELLIPTA) 62.5-25 mcg/actuation inhaler Inhale 1 puff.      ??? lactulose (CHRONULAC) 10 gram/15 mL solution Take 30 mL (20 g total) by mouth daily. 900 mL 6   ??? rifAXIMin (XIFAXAN) 550 mg Tab Take 1 tablet (550 mg total) by mouth Two (2) times a day. 60 tablet 6     No current facility-administered medications for this visit.    See scanned MAR.    ALLERGIES:  Lisinopril and Codeine    FAMILY HISTORY:  No family history of liver disease.      SOCIAL HISTORY:  Social History     Tobacco Use   ??? Smoking status: Former Smoker     Quit date: 02/21/2014     Years since quitting: 4.7   ??? Smokeless tobacco: Never Used   Substance Use Topics   ??? 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     ??? 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   Previous IV drug use in the 1980s.    OBJECTIVE:   VITAL SIGNS and PHYSICAL EXAM:  BP 118/63  - Pulse 58  - Temp 37 ??C (98.6 ??F)  - Wt 93.9 kg (207 lb)  - SpO2 98%  - BMI 32.42 kg/m??   General appearance - alert, well appearing, and in no distress, oriented to person, place, and time and overweight  Mental status - depressed mood  Eyes - pupils equal and reactive, extraocular eye movements intact, sclera anicteric  Chest - clear to auscultation, no wheezes, rales or rhonchi, symmetric air entry  Heart - normal rate, regular rhythm, normal S1, S2, no murmurs, rubs, clicks or gallops  Abdomen - soft, generalized tenderness entire abdomen as well as pelvis. No masses or organomegaly  Neurological - No asterixis. No gross neurological findings.   Musculoskeletal - no joint tenderness, deformity or swelling  Extremities - peripheral pulses normal, no pedal edema, no clubbing or cyanosis  Skin - normal coloration and turgor, no rashes, no suspicious skin lesions noted    Laboratory results:  Results for orders placed or performed in visit on 11/07/18   Urinalysis   Result Value Ref Range    Color, UA Light Yellow     Clarity, UA Clear     Specific Gravity, UA 1.006 1.003 -  1.030    pH, UA 5.0 5.0 - 9.0    Leukocyte Esterase, UA Negative Negative    Nitrite, UA Negative Negative    Protein, UA Negative Negative    Glucose, UA Negative Negative    Ketones, UA Negative Negative    Urobilinogen, UA 0.2 mg/dL 0.2 mg/dL, 1.0 mg/dL    Bilirubin, UA Negative Negative    Blood, UA Negative Negative    RBC, UA 1 <=4 /HPF    WBC, UA <1 0 - 5 /HPF    Squam Epithel, UA 1 0 - 5 /HPF    Bacteria, UA Few (A) None Seen /HPF    Hyaline Casts, UA 9 (H) 0 - 1 /LPF    Mucus, UA Rare (A) None Seen /HPF   Sodium, Random Urine   Result Value Ref Range    Sodium, Ur 113 Undefined mmol/L   AFP non-maternal tumor marker   Result Value Ref Range    AFP-Tumor Marker 9 (H) <8 ng/mL   PT-INR   Result Value Ref Range    PT 13.8 (H) 10.2 - 13.1 sec    INR 1.20    Hepatic Function Panel   Result Value Ref Range    Albumin 3.8 3.5 - 5.0 g/dL    Total Protein 7.0 6.5 - 8.3 g/dL    Total Bilirubin 0.6 0.0 - 1.2 mg/dL    Bilirubin, Direct 2.95 0.00 - 0.40 mg/dL    AST 52 (H) 14 - 38 U/L    ALT 33 <35 U/L    Alkaline Phosphatase 163 (H) 38 - 126 U/L   Basic metabolic panel   Result Value Ref Range    Sodium 134 (L) 135 - 145 mmol/L    Potassium 4.5 3.5 - 5.0 mmol/L    Chloride 98 98 - 107 mmol/L    CO2 27.0 22.0 - 30.0 mmol/L    Anion Gap 9 7 - 15 mmol/L    BUN 14 7 - 21 mg/dL    Creatinine 6.21 (H) 0.60 - 1.00 mg/dL    BUN/Creatinine Ratio 10     EGFR CKD-EPI Non-African American, Female 41 (L) >=60 mL/min/1.89m2    EGFR CKD-EPI African American, Female 47 (L) >=60 mL/min/1.67m2    Glucose 148 70 - 179 mg/dL    Calcium 9.8 8.5 - 30.8 mg/dL   CBC w/ Differential   Result Value Ref Range    WBC 7.7 4.5 - 11.0 10*9/L    RBC 5.08 4.00 - 5.20 10*12/L    HGB 13.5 12.0 - 16.0 g/dL    HCT 65.7 84.6 - 96.2 %    MCV 85.0 80.0 - 100.0 fL    MCH 26.5 26.0 - 34.0 pg    MCHC 31.2 31.0 - 37.0 g/dL    RDW 95.2 84.1 - 32.4 %    MPV 10.0 7.0 - 10.0 fL    Platelet 263 150 - 440 10*9/L    Neutrophils % 74.1 %    Lymphocytes % 10.3 %    Monocytes % 8.2 %    Eosinophils % 3.1 %    Basophils % 1.8 %    Absolute Neutrophils 5.7 2.0 - 7.5 10*9/L    Absolute Lymphocytes 0.8 (L) 1.5 - 5.0 10*9/L    Absolute Monocytes 0.6 0.2 - 0.8 10*9/L    Absolute Eosinophils 0.2 0.0 - 0.4 10*9/L    Absolute Basophils 0.1 0.0 - 0.1 10*9/L    Large Unstained Cells 3 0 - 4 %  Hypochromasia Marked (A) Not Present   Morphology Review   Result Value Ref Range    Smear Review Comments See Comment (A) Undefined       ASSESSMENT AND PLAN:   1.  Decompensated cirrhosis secondary to HCV and alcohol abuse history (+ HE, - ascites, - variceal bleed).    Ms. Rensch is a very pleasant 63 yo AA female whose chief complaints today are continued cirrhosis care, HE follow up and chronic constipation. Today patient revealed she's received three LVP thus far over the past three months. Hospitalized for ascites in August. Concern for SBP but ascitic fluid evaluation was negative.     She continues to experience chronic constipation. She is no longer taking lactulose and prescribed Linzess 290 and taking MOM over the counter. Normal bowel habit pattern is two to three movements daily, but no denies emptying well.     HE: Prescribed Xifaxan 550 mg bid, but states she has not been able to get RX from her local pharmacy due to prescriber's name change? Will assist patient in following up on this concern. Wish for her to remain on Xifaxan 550 mg bid and add lactulose back with dose of 30 ml qd. Assist in management of HE given her history and constipation.     Nutritional support: Adhere to strict low sodium diet of less than 2,000 mg total daily and diet high in lean protein. Goal of 30 grams of lean protein with each meal and high protein snack at bedtime. + low albumin level contributing factor as well for poor fluid control.     Thirty grams of lean protein with each meal and high protein snack at bedtime. Patient education information given.     2.  BLE/Ascites: Continue Lasix 40 mg and Aldactone 50 mg daily.  Again stressed the fact she needs to be more adherent with follow less then 2 gm sodium diet daily. Patient education information given.     ~ MELD labs ordered  ~ UA and random urine sodium level ordered.     3. HE -Xifaxan 550 mg bid. New RX for lactulose 30 ml qd as noted above. Dispense one months with six refills.     4. Diabetes mellitus ~Strict control necessary.   Oral therapy at this time. Insulin being held.      5. HCC screening: CT scan of abdomen and pelvis with and without contrast ordered for [facility] screening as well as evaluation of abd and pelvis pain.     5.  History of HCV ~ Prior treatment-experienced with PEG/RVN at [facility] in 1997, genotype 1a. Successfully treated and cured s/p Harvoni x 24 weeks.     6.  Alcohol assessment: No alcohol use. AUDIT ~ zero.     7. Varices Screening- EGD ordered.     8. COPD: Under the care of [facility] pulmonary clinic. Congratulated on smoking cessation.     All patient's questions were answered to their satisfaction during visit.     Office follow up three months.     Sister ~ [facility] , Cell # [phone]).      Rodman Key, DNP, FNP-BC  [facility]  [address]  [place] [address]  Phone [phone]

## 2018-11-22 NOTE — Unmapped (Signed)
[facility] Specialty Medication Referral: No PA required    Medication (Brand/Generic): Xifaxan 550 mg tablets    Initial Benefits Investigation Claim completed with resulted information below:  No PA required  Patient ABLE to fill at [facility]  Insurance Company:  [employer]  Anticipated Copay: $0.00    As Co-pay is under $25 defined limit, per policy there will be no further investigation of need for financial assistance at this time unless patient requests. This referral has been communicated to the provider and handed off to the [facility] team for further processing and filling of prescribed medication.   ______________________________________________________________________  Please utilize this referral for viewing purposes as it will serve as the central location for all relevant documentation and updates.

## 2018-11-23 NOTE — Unmapped (Signed)
[facility]   Patient Onboarding/Medication Counseling    Chloe Moore is a 63 y.o. female with cirrhosis who I am counseling today on continuation of therapy.  I am speaking to the patient.    Verified patient's date of birth / HIPAA.    Specialty medication(s) to be sent: Infectious Disease: Xifaxan      Non-specialty medications/supplies to be sent: n/a      Medications not needed at this time: n/a         Xifaxan (rifaximin) 550mg  tablets      Medication & Administration     Dosage: Hepatic Encephalopathy Prevention: Take one 550mg  tablet by mouth twice daily.    Administration: Take with or without food.    Adherence/Missed dose instructions:   ? Take missed dose as soon as you remember. If it is close to the time of your next dose, skip the missed dose and resume with your next scheduled dose.  ? Do not take extra doses or 2 doses at the same time.    Goals of Therapy     Hepatic Encephalopathy: The goal is to reduce risk of overt hepatic encephalopathy recurrence.    Side Effects & Monitoring Parameters     Common Side Effects:   ? Peripheral edema  ? Nausea  ? Dizziness  ? Fatigue  ? Ascites    The following side effects should be reported to the provider:  ?? Signs of an allergic reaction, such as rash; hives; itching; red, swollen, blistered, or peeling skin with or without fever. If you have wheezing; tightness in the chest or throat; trouble breathing, swallowing, or talking; unusual hoarseness; or swelling of the mouth, face, lips, tongue, or throat, call 911 or go to the closest emergency department (ED).   ?? Swelling in the arms, legs or stomach.   ?? Feeling very tired or weak.  ?? Low mood (depression).   ?? Fever. ?? Diarrhea is common with antibiotics. Rarely, a severe form called C diff???associated diarrhea (CDAD) may happen. Sometimes this has led to a deadly bowel problem (colitis). CDAD may happen during or a few months after taking antibiotics. Call your doctor right away if you have stomach pain, cramps, or very loose, watery, or bloody stools. Check with your doctor before treating diarrhea.    Monitoring Parameters:   ? For the prevention of hepatic encephalopathy:  Patient should monitor for changes in mental status.   ? For IBS-D: Monitor for improvement in symptoms such as a decrease in diarrhea.      Contraindications, Warnings, & Precautions     ? Superinfection: Prolonged use may result in fungal or bacterial superinfection, including Clostridioides (formerly Clostridium) difficile-associated diarrhea (CDAD) and pseudomembranous colitis; CDAD has been observed >2 months post-antibiotic treatment.  ? Severe (Child Pugh Class C) Hepatic Impairment: increased systemic exposure with severe hepatic impairment.  ? Concomitant use with P-glycoprotein (P-gp) inhibitors: P-gp inhibitors may increase systemic exposure of rifaximin.    Drug/Food Interactions     ? Medication list reviewed in Epic. The patient was instructed to inform the care team before taking any new medications or supplements. No drug interactions identified.   ? Warfarin: monitor INR and prothrombin time; Dose adjustment of warfarin may be needed to maintain target INR range.    Storage, Handling Precautions, & Disposal     ? Store this medication at room temperature.  ? Store in a dry place. Do not store in a bathroom.   ?  Keep all drugs out of the reach of children and pets.  ? Throw away unused or expired drugs. Do not flush down a toilet or pour down a drain unless you are told to do so. Check with your pharmacist if you have questions about the best way to throw out drugs. There may be drug take-back programs in your area. Current Medications (including OTC/herbals), Comorbidities and Allergies     Current Outpatient Medications   Medication Sig Dispense Refill   ??? albuterol 2.5 mg /3 mL (0.083 %) nebulizer solution Inhale 2.5 mg every six (6) hours as needed.      ??? amLODIPine (NORVASC) 10 MG tablet Take 1 tablet (10 mg total) by mouth daily. 30 tablet 11   ??? aspirin 81 MG chewable tablet Chew 1 tablet (81 mg total) daily. 30 tablet 0   ??? atorvastatin (LIPITOR) 20 MG tablet Take 1 tablet by mouth Continuous.     ??? blood sugar diagnostic Strp Check blood sugars six times a day. 250.62 Uncontrolled diabetes, uses insulin.     ??? diaper,brief,adult,disposable (ADULT BRIEF - EXTRA LARGE) Misc Use as directed 90 each 6   ??? diclofenac sodium (VOLTAREN) 1 % gel Apply 2 g topically daily as needed for arthritis. 100 g 0   ??? escitalopram oxalate (LEXAPRO) 5 MG tablet      ??? furosemide (LASIX) 20 MG tablet Take 2 tablets (40 mg total) by mouth daily. 60 tablet 6   ??? gabapentin (NEURONTIN) 300 MG capsule 300 mg Three (3) times a day.      ??? hydroCHLOROthiazide (HYDRODIURIL) 25 MG tablet Take 1 tablet by mouth Continuous.     ??? lactulose (CHRONULAC) 10 gram/15 mL solution Take 30 mL (20 g total) by mouth daily. 900 mL 6   ??? lancets Misc Use to check blood sugars six times a day.     ??? linaCLOtide (LINZESS) 290 mcg capsule Take 1 capsule (290 mcg total) by mouth daily. 30 capsule 6   ??? linagliptin (TRADJENTA) 5 mg Tab Take 5 mg by mouth daily.     ??? metFORMIN (GLUCOPHAGE) 500 MG tablet Take 1 tablet (500 mg total) by mouth 2 (two) times a day with meals.     ??? metoprolol tartrate (LOPRESSOR) 100 MG tablet Take 0.5 tablets (50 mg total) by mouth Two (2) times a day. 30 tablet 0   ??? oxyCODONE (ROXICODONE) 5 MG immediate release tablet Take 1 tablet by mouth 4 (four) times a day as needed.     ??? pen needle, diabetic 32 gauge x 5/32 Ndle Use to give insulin 6 times a day. ??? PNEUMOVAX 23 25 mcg/0.5 mL Syrg injection PHARMACIST ADMINISTERED IMMUNIZATION ADMINISTERED AT TIME OF DISPENSING  0   ??? rifAXIMin (XIFAXAN) 550 mg Tab Take 1 tablet (550 mg total) by mouth Two (2) times a day. 60 tablet 6   ??? spironolactone (ALDACTONE) 50 MG tablet Take 1 tablet (50 mg total) by mouth daily. 60 tablet 3   ??? umeclidinium-vilanterol (ANORO ELLIPTA) 62.5-25 mcg/actuation inhaler Inhale 1 puff.        No current facility-administered medications for this visit.        Allergies   Allergen Reactions   ??? Lisinopril Swelling     Found out about 2 months ago, had her tongue swollen and hanging out of her mouth per patient report   ??? Codeine Itching       Patient Active Problem List   Diagnosis   ??? Injury  of kidney   ??? Altered mental status   ??? Hypertension, benign   ??? Bipolar affective disorder (CMS-HCC)   ??? Chronic hepatitis C (CMS-HCC)   ??? Chronic kidney disease   ??? Chronic pain not due to malignancy   ??? Chronic obstructive pulmonary disease (COPD) (CMS-HCC)   ??? Chronic kidney disease, stage III (moderate)   ??? Coagulation disorder (CMS-HCC)   ??? Cocaine abuse (CMS-HCC)   ??? Chronic obstructive pulmonary disease (CMS-HCC)   ??? Dementia (CMS-HCC)   ??? Depressive disorder   ??? Diabetes mellitus (CMS-HCC)   ??? Diabetic neuropathy (CMS-HCC)   ??? Type 2 diabetes mellitus (CMS-HCC)   ??? Esophageal reflux   ??? Fall   ??? Pruritic disorder   ??? Weakness   ??? Gastroesophageal reflux disease   ??? Chronic hepatitis C virus infection (CMS-HCC)   ??? Personal history of traumatic fracture   ??? Hypertension   ??? Hyperkalemia   ??? Hyponatremia   ??? Itching   ??? Adverse reaction to drug   ??? Mild cognitive impairment   ??? Adiposity   ??? Obesity   ??? Idiopathic peripheral neuropathy   ??? Polypharmacy   ??? Thrombocytopenia (CMS-HCC)   ??? Tobacco use disorder   ??? Wrist drop   ??? Chronic viral hepatitis C (CMS-HCC)   ??? Adverse effect of drug   ??? Decreased platelet count (CMS-HCC)   ??? Hepatitis C ??? COPD (chronic obstructive pulmonary disease) (CMS-HCC)   ??? Asthma   ??? Acute renal failure (ARF) (CMS-HCC)   ??? Other cirrhosis of liver (CMS-HCC)   ??? Sinusitis   ??? Epistaxis, recurrent   ??? Lip lesion   ??? Intermittent exotropia   ??? Diplopia   ??? Ascites   ??? Spontaneous bacterial peritonitis (CMS-HCC)       Reviewed and up to date in Epic.    Appropriateness of Therapy     Is medication and dose appropriate based on diagnosis? Yes    Baseline Quality of Life Assessment      How many days over the past month did your cirrhosis keep you from your normal activities? 0    Financial Information     Medication Assistance provided: None Required    Anticipated copay of $0.00 reviewed with patient. Verified delivery address.    Delivery Information     Scheduled delivery date: 12/07/2018    Expected start date: Continuation of existing therapy    Medication will be delivered via UPS to the home address in [place].  This shipment will not require a signature.      Explained the services we provide at [facility] and that each month we would call to set up refills.  Stressed importance of returning phone calls so that we could ensure they receive their medications in time each month.  Informed patient that we should be setting up refills 7-10 days prior to when they will run out of medication.  A pharmacist will reach out to perform a clinical assessment periodically.  Informed patient that a welcome packet and a drug information handout will be sent.      Patient verbalized understanding of the above information as well as how to contact the pharmacy at [phone] option 4 with any questions/concerns.  The pharmacy is open Monday through Friday 8:30am-4:30pm.  A pharmacist is available 24/7 via pager to answer any clinical questions they may have.    Patient Specific Needs     ? Does the patient have  any physical, cognitive, or cultural barriers? No ? Patient prefers to have medications discussed with  Patient     ? Is the patient able to read and understand education materials at a high school level or above? Yes    ? Patient's primary language is  English     ? Is the patient high risk? No     ? Does the patient require a Care Management Plan? No     ? Does the patient require physician intervention or other additional services (i.e. nutrition, smoking cessation, social work)? No      Roderic Palau  [facility] Specialty Pharmacist

## 2018-12-01 ENCOUNTER — Encounter: Admit: 2018-12-01 | Discharge: 2018-12-01 | Payer: MEDICARE

## 2018-12-02 DIAGNOSIS — K7460 Unspecified cirrhosis of liver: Principal | ICD-10-CM

## 2018-12-02 DIAGNOSIS — K5901 Slow transit constipation: Principal | ICD-10-CM

## 2018-12-02 DIAGNOSIS — R933 Abnormal findings on diagnostic imaging of other parts of digestive tract: Principal | ICD-10-CM

## 2018-12-02 DIAGNOSIS — G9340 Encephalopathy, unspecified: Principal | ICD-10-CM

## 2018-12-02 MED ORDER — LACTULOSE 10 GRAM/15 ML ORAL SOLUTION: 45 mL | mL | Freq: Two times a day (BID) | 6 refills | 13 days | Status: AC

## 2018-12-02 MED ORDER — LACTULOSE 10 GRAM/15 ML ORAL SOLUTION: 45 mL | mL | Freq: Two times a day (BID) | 6 refills | 30 days | Status: AC

## 2018-12-02 NOTE — Unmapped (Signed)
Spoke with patient and reviewed recent CT scan of abdomen findings. Finding of stool burden. Will increase lactulose dosing to 45 ml bid with goal of at least three bowel movement daily. Appears HE is not well controlled as it could be too. Very sleepy during day and not sleeping well at PM. Currently taking 45 ml of lactulose daily and BM pattern one to two times daily. Will proceed with MRI of abdomen in January with follow up visit same date. Recommended short interval follow up with imaging based on recent CT scan findings. See result under appropriate tab in Epic. Order placed and GI scheduling will reach out to patient. Instructed patient to notify our office if any concerns or no improvement in symptoms with increasing lactulose.

## 2018-12-06 NOTE — Unmapped (Signed)
Chloe Moore 's [facility] shipment will be delayed as a result of the medication is too soon to refill until 11/5.     I have reached out to the patient and communicated the delivery change. We will reschedule the medication for the delivery date that the patient agreed upon.  We have confirmed the delivery date as 11/6

## 2018-12-16 NOTE — Unmapped (Signed)
Chloe Moore 's Xifaxan shipment will be delayed as a result of the medication is too soon to refill until 11/28.     I have reached out to the patient and left a voicemail message.  We will wait for a call back from the patient to reschedule the delivery.  We have not confirmed the new delivery date.

## 2018-12-21 NOTE — Unmapped (Signed)
UPDATE:     New delivery date is 01/09/2019 via [employer].  Confirmed date with patient.    Corliss Skains. [place], [place].D.  Specialty Pharmacist  [facility]  [phone] option 4

## 2018-12-28 ENCOUNTER — Encounter: Admit: 2018-12-28 | Discharge: 2018-12-29 | Payer: MEDICARE | Attending: Family | Primary: Family

## 2018-12-28 NOTE — Unmapped (Signed)
COVID Pre-Procedure Intake Form     Assessment     Chloe Moore is a 63 y.o. female presenting to [facility] for COVID testing.     Plan     If no testing performed, pt counseled on routine care for respiratory illness.  If testing performed, COVID sent.  Patient directed to Home given findings during today's visit.    Subjective     Chloe Moore is a 63 y.o. female who [facility] with complaints of the following:    Exposure History: In the last 21 days?     Have you traveled outside of [place]? No               Have you been in close contact with someone confirmed by a test to have COVID? (Close contact is within 6 feet for at least 10 minutes) Yes, Who? 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    Yes         Are you scheduled to have surgery or a procedure in the next 3 days? Yes               Are you scheduled to receive cancer chemotherapy within the next 7 days?    No     Have you ever been tested before for COVID-19 with a swab of your nose? Yes: When: NA , Where: NA    Are you a healthcare worker being tested so to return to work No     Right now,  do you have any of the following that developed over the past 7 days (as stated by patient on intake form):    Subjective fever (felt feverish) No   Chills (especially repeated shaking chills) No   Severe fatigue (felt very tired) No   Muscle aches No   Runny nose No   Sore throat No   Loss of taste or smell No   Cough (new onset or worsening of chronic cough) No   Shortness of breath No   Nausea or vomiting No   Headache No   Abdominal Pain No   Diarrhea (3 or more loose stools in last 24 hours) No Scribe's Attestation: Vedia Coffer, FNP obtained and performed the history, physical exam and medical decision making elements that were entered into the chart.  Signed by Erma Heritage serving as Scribe, on 12/28/2018 9:55 AM      The documentation recorded by the scribe accurately reflects the service I personally performed and the decisions made by me. Aida Puffer, FNP  December 29, 2018 7:40 PM

## 2018-12-30 ENCOUNTER — Encounter: Admit: 2018-12-30 | Discharge: 2018-12-30 | Payer: MEDICARE | Attending: Registered Nurse | Primary: Registered Nurse

## 2018-12-30 ENCOUNTER — Encounter: Admit: 2018-12-30 | Discharge: 2018-12-30 | Payer: MEDICARE

## 2019-01-23 ENCOUNTER — Encounter: Admit: 2019-01-23 | Discharge: 2019-01-24 | Payer: MEDICARE

## 2019-01-23 LAB — COMPREHENSIVE METABOLIC PANEL
ALKALINE PHOSPHATASE: 165 U/L — ABNORMAL HIGH (ref 38–126)
ALT (SGPT): 40 U/L — ABNORMAL HIGH (ref <35)
ANION GAP: 8 mmol/L (ref 7–15)
AST (SGOT): 71 U/L — ABNORMAL HIGH (ref 14–38)
BILIRUBIN TOTAL: 0.6 mg/dL (ref 0.0–1.2)
BLOOD UREA NITROGEN: 12 mg/dL (ref 7–21)
BUN / CREAT RATIO: 12
CALCIUM: 8.5 mg/dL (ref 8.5–10.2)
CO2: 23 mmol/L (ref 22.0–30.0)
CREATININE: 0.97 mg/dL (ref 0.60–1.00)
EGFR CKD-EPI AA FEMALE: 71 mL/min/{1.73_m2} (ref >=60)
EGFR CKD-EPI NON-AA FEMALE: 62 mL/min/{1.73_m2} (ref >=60)
GLUCOSE RANDOM: 104 mg/dL (ref 70–179)
POTASSIUM: 4 mmol/L (ref 3.5–5.0)
PROTEIN TOTAL: 5.8 g/dL — ABNORMAL LOW (ref 6.5–8.3)
SODIUM: 133 mmol/L — ABNORMAL LOW (ref 135–145)

## 2019-01-23 LAB — CBC W/ AUTO DIFF
BASOPHILS ABSOLUTE COUNT: 0.1 10*9/L (ref 0.0–0.1)
EOSINOPHILS ABSOLUTE COUNT: 0.3 10*9/L (ref 0.0–0.4)
EOSINOPHILS RELATIVE PERCENT: 3.5 %
HEMATOCRIT: 37.2 % (ref 36.0–46.0)
LARGE UNSTAINED CELLS: 2 % (ref 0–4)
LYMPHOCYTES ABSOLUTE COUNT: 0.7 10*9/L — ABNORMAL LOW (ref 1.5–5.0)
LYMPHOCYTES RELATIVE PERCENT: 9.5 %
MEAN CORPUSCULAR HEMOGLOBIN CONC: 31.8 g/dL (ref 31.0–37.0)
MEAN CORPUSCULAR HEMOGLOBIN: 26.5 pg (ref 26.0–34.0)
MEAN CORPUSCULAR VOLUME: 83.3 fL (ref 80.0–100.0)
MEAN PLATELET VOLUME: 11.5 fL — ABNORMAL HIGH (ref 7.0–10.0)
MONOCYTES ABSOLUTE COUNT: 0.6 10*9/L (ref 0.2–0.8)
MONOCYTES RELATIVE PERCENT: 7.5 %
NEUTROPHILS ABSOLUTE COUNT: 5.7 10*9/L (ref 2.0–7.5)
NEUTROPHILS RELATIVE PERCENT: 76.3 %
PLATELET COUNT: 195 10*9/L (ref 150–440)
RED BLOOD CELL COUNT: 4.47 10*12/L (ref 4.00–5.20)
RED CELL DISTRIBUTION WIDTH: 15.8 % — ABNORMAL HIGH (ref 12.0–15.0)
WBC ADJUSTED: 7.5 10*9/L (ref 4.5–11.0)

## 2019-01-23 LAB — URINALYSIS WITH CULTURE REFLEX
BILIRUBIN UA: NEGATIVE
GLUCOSE UA: NEGATIVE
KETONES UA: NEGATIVE
LEUKOCYTE ESTERASE UA: NEGATIVE
NITRITE UA: NEGATIVE
PH UA: 6 (ref 5.0–9.0)
PROTEIN UA: NEGATIVE
RBC UA: <1 /HPF (ref <=4)
SPECIFIC GRAVITY UA: 1.002 — ABNORMAL LOW (ref 1.003–1.030)
WBC UA: <1 /HPF (ref 0–5)

## 2019-01-23 LAB — LIPASE: Triacylglycerol lipase:CCnc:Pt:Ser/Plas:Qn:: 123

## 2019-01-23 LAB — CREATININE: Creatinine:MCnc:Pt:Ser/Plas:Qn:: 0.97

## 2019-01-23 LAB — PROTIME: Coagulation tissue factor induced:Time:Pt:PPP:Qn:Coag: 14.4 — ABNORMAL HIGH

## 2019-01-23 LAB — PRO-BNP: Natriuretic peptide.B prohormone N-Terminal:MCnc:Pt:Ser/Plas:Qn:: 215

## 2019-01-23 LAB — MAGNESIUM: Magnesium:MCnc:Pt:Ser/Plas:Qn:: 2

## 2019-01-23 LAB — MONOCYTES ABSOLUTE COUNT: Monocytes:NCnc:Pt:Bld:Qn:Automated count: 0.6

## 2019-01-23 LAB — PROTIME-INR: INR: 1.22

## 2019-01-23 LAB — GAMMA GLUTAMYL TRANSFERASE: Gamma glutamyl transferase:CCnc:Pt:Ser/Plas:Qn:: 188 — ABNORMAL HIGH

## 2019-01-23 LAB — BILIRUBIN UA: Bilirubin:PrThr:Pt:Urine:Ord:Test strip: NEGATIVE

## 2019-01-23 LAB — BILIRUBIN DIRECT: Bilirubin.glucuronidated+Bilirubin.albumin bound:MCnc:Pt:Ser/Plas:Qn:: 0.2

## 2019-01-23 MED ADMIN — cefepime (MAXIPIME) 2 g in dextrose 100 mL IVPB (premix): 2 g | INTRAVENOUS | @ 22:00:00 | Stop: 2019-01-23

## 2019-01-23 MED ADMIN — famotidine (PF) (PEPCID) injection 20 mg: 20 mg | INTRAVENOUS | @ 19:00:00 | Stop: 2019-01-23

## 2019-01-23 MED ADMIN — iohexoL (OMNIPAQUE) 350 mg iodine/mL solution 100 mL: 100 mL | INTRAVENOUS | @ 20:00:00 | Stop: 2019-01-23

## 2019-01-23 NOTE — Unmapped (Signed)
.  Patient from triage escorted to room 61. Pt paced on cardiac monitor, continuous pulse oximeter and continuous blood pressure monitoring.

## 2019-01-23 NOTE — Unmapped (Signed)
[facility]  Emergency Department Provider Note          ED Chief Complain     Abdominal Pain      Additional Medical Decision Making in the setting of COVID pandermic     I have reviewed the vital signs and the nursing notes. Labs and radiology results that were available during my care of the patient were independently reviewed by me and considered in my medical decision making. Chloe Moore was evaluated in Emergency Department at the time of this visit for the symptoms described in the history of present illness. She was evaluated in the context of the global COVID-19 pandemic, which necessitated consideration that the patient might be at risk for infection with the SARS-CoV-2 virus that causes COVID-19. Institutional protocols and algorithms that pertain to the evaluation of patients at risk for COVID-19 were followed during the patient's care in the ED.    This provider entered the patient's room: Yes:    ? If this provider did not enter the room, a comprehensive physical exam was not able to be performed due to increased infection risk to themselves, other providers, staff and other patients), as well as to conserve personal protective equipment (PPE) utilization during the COVID-19 pandemic.    ? If this provider did enter the patient room, the following was PPE worn: N95, goggles, gown and gloves    Case discussed with ED attending Dr. Magdalene Molly who is agreeable with the plan.    ____________________________________________       History     HPI   Past Medical History:   Diagnosis Date   ??? Acute renal failure (ARF) (CMS-HCC)    ??? Allergic reaction caused by a drug    ??? Arthritis    ??? Asthma    ??? Asthma    ??? Cirrhosis (CMS-HCC)    ??? COPD (chronic obstructive pulmonary disease) (CMS-HCC)    ??? Diabetes mellitus (CMS-HCC)    ??? Hepatitis C    ??? Hypertension    ??? Sinusitis Chloe Moore is a 63 y.o. female with a past medical history of COPD, cirrhosis, hep C, presented to ER for abdominal pain.  Patient states that she has progressive worsening, constant, dull, abdominal pain radiating to her bilateral back for the past 2 to 3 weeks, and that there is no acute changes today.  No fever, no shortness of breath, no chest pain, no emesis or diarrhea.  Patient is here in the ED because I am sick of it.  Patient denies any trauma.    Per chart review, patient had a similar presentation last time with a constant, progressive worsening dull abdominal pain rating to the back.  She was tapped by that time and she was found to have a nucleated cells of 330 of the ascitic fluid.  She was hospitalized and received antibiotics.    Review of Systems:  Constitutional: no fever.  Eyes: no visual changes.  ENT: no sore throat.  Cardiovascular: no chest pain.  Respiratory: no shortness of breath.  Gastrointestinal: + abdominal pain, no vomiting, no diarrhea.  Genitourinary: no dysuria.  Musculoskeletal: no back pain.  Skin: no rash.  Neurological: no headaches, no focal weakness, no focal numbness.    Patient Active Problem List   Diagnosis   ??? Injury of kidney   ??? Altered mental status   ??? Hypertension, benign   ??? Bipolar affective disorder (CMS-HCC)   ??? Chronic hepatitis C (CMS-HCC)   ???  Chronic kidney disease   ??? Chronic pain not due to malignancy   ??? Chronic obstructive pulmonary disease (COPD) (CMS-HCC)   ??? Chronic kidney disease, stage III (moderate)   ??? Coagulation disorder (CMS-HCC)   ??? Cocaine abuse (CMS-HCC)   ??? Chronic obstructive pulmonary disease (CMS-HCC)   ??? Dementia (CMS-HCC)   ??? Depressive disorder   ??? Diabetes mellitus (CMS-HCC)   ??? Diabetic neuropathy (CMS-HCC)   ??? Type 2 diabetes mellitus (CMS-HCC)   ??? Esophageal reflux   ??? Fall   ??? Pruritic disorder   ??? Weakness   ??? Gastroesophageal reflux disease   ??? Chronic hepatitis C virus infection (CMS-HCC) ??? Personal history of traumatic fracture   ??? Hypertension   ??? Hyperkalemia   ??? Hyponatremia   ??? Itching   ??? Adverse reaction to drug   ??? Mild cognitive impairment   ??? Adiposity   ??? Obesity   ??? Idiopathic peripheral neuropathy   ??? Polypharmacy   ??? Thrombocytopenia (CMS-HCC)   ??? Tobacco use disorder   ??? Wrist drop   ??? Chronic viral hepatitis C (CMS-HCC)   ??? Adverse effect of drug   ??? Decreased platelet count (CMS-HCC)   ??? Hepatitis C   ??? COPD (chronic obstructive pulmonary disease) (CMS-HCC)   ??? Asthma   ??? Acute renal failure (ARF) (CMS-HCC)   ??? Other cirrhosis of liver (CMS-HCC)   ??? Sinusitis   ??? Epistaxis, recurrent   ??? Lip lesion   ??? Intermittent exotropia   ??? Diplopia   ??? Ascites   ??? Spontaneous bacterial peritonitis (CMS-HCC)       Past Surgical History:   Procedure Laterality Date   ??? APPENDECTOMY     ??? CARPAL TUNNEL RELEASE     ??? CHOLECYSTECTOMY     ??? HYSTERECTOMY     ??? OOPHORECTOMY     ??? PR COLON CA SCRN NOT HI RSK IND N/A 07/18/2014    Procedure: COLOREC CNCR SCR;COLNSCPY NO;  Surgeon: Bronson Curb, MD;  [facility];  Service: Gastroenterology   ??? PR UPPER GI ENDOSCOPY,DIAGNOSIS N/A 07/18/2014    Procedure: UGI ENDO, INCLUDE ESOPHAGUS, STOMACH, & DUODENUM &/OR JEJUNUM; DX W/WO COLLECTION SPECIMN, BY BRUSH OR WASH;  Surgeon: Bronson Curb, MD;  [facility];  Service: Gastroenterology   ??? PR UPPER GI ENDOSCOPY,DIAGNOSIS N/A 12/30/2018    Procedure: UGI ENDO, INCLUDE ESOPHAGUS, STOMACH, & DUODENUM &/OR JEJUNUM; DX W/WO COLLECTION SPECIMN, BY BRUSH OR WASH;  Surgeon: Jules Husbands, MD;  [facility];  Service: Gastroenterology   ??? REDUCTION MAMMAPLASTY Bilateral        No current facility-administered medications for this encounter.     Current Outpatient Medications:   ???  albuterol 2.5 mg /3 mL (0.083 %) nebulizer solution, Inhale 2.5 mg every six (6) hours as needed. , Disp: , Rfl: ???  amLODIPine (NORVASC) 10 MG tablet, Take 1 tablet (10 mg total) by mouth daily., Disp: 30 tablet, Rfl: 11  ???  aspirin 81 MG chewable tablet, Chew 1 tablet (81 mg total) daily., Disp: 30 tablet, Rfl: 0  ???  atorvastatin (LIPITOR) 20 MG tablet, Take 1 tablet by mouth Continuous., Disp: , Rfl:   ???  blood sugar diagnostic Strp, Check blood sugars six times a day. 250.62 Uncontrolled diabetes, uses insulin., Disp: , Rfl:   ???  diaper,brief,adult,disposable (ADULT BRIEF - EXTRA LARGE) Misc, Use as directed, Disp: 90 each, Rfl: 6  ???  diclofenac sodium (VOLTAREN) 1 % gel, Apply 2 g topically daily as needed for arthritis., Disp: 100 g, Rfl: 0  ???  escitalopram oxalate (LEXAPRO) 5 MG tablet, , Disp: , Rfl:   ???  furosemide (LASIX) 20 MG tablet, Take 2 tablets (40 mg total) by mouth daily., Disp: 60 tablet, Rfl: 6  ???  gabapentin (NEURONTIN) 300 MG capsule, 300 mg Three (3) times a day. , Disp: , Rfl:   ???  hydroCHLOROthiazide (HYDRODIURIL) 25 MG tablet, Take 1 tablet by mouth Continuous., Disp: , Rfl:   ???  lactulose (CHRONULAC) 10 gram/15 mL solution, Take 45 mL (30 g total) by mouth Two (2) times a day., Disp: 1200 mL, Rfl: 6  ???  lancets Misc, Use to check blood sugars six times a day., Disp: , Rfl:   ???  linaCLOtide (LINZESS) 290 mcg capsule, Take 1 capsule (290 mcg total) by mouth daily., Disp: 30 capsule, Rfl: 6  ???  linagliptin (TRADJENTA) 5 mg Tab, Take 5 mg by mouth daily., Disp: , Rfl:   ???  metFORMIN (GLUCOPHAGE) 500 MG tablet, Take 1 tablet (500 mg total) by mouth 2 (two) times a day with meals., Disp: , Rfl:   ???  metoprolol tartrate (LOPRESSOR) 100 MG tablet, Take 0.5 tablets (50 mg total) by mouth Two (2) times a day., Disp: 30 tablet, Rfl: 0  ???  oxyCODONE (ROXICODONE) 5 MG immediate release tablet, Take 1 tablet by mouth 4 (four) times a day as needed., Disp: , Rfl:   ???  pen needle, diabetic 32 gauge x 5/32 Ndle, Use to give insulin 6 times a day., Disp: , Rfl: ???  PNEUMOVAX 23 25 mcg/0.5 mL Syrg injection, PHARMACIST ADMINISTERED IMMUNIZATION ADMINISTERED AT TIME OF DISPENSING, Disp: , Rfl: 0  ???  rifAXIMin (XIFAXAN) 550 mg Tab, Take 1 tablet (550 mg total) by mouth Two (2) times a day., Disp: 60 tablet, Rfl: 6  ???  spironolactone (ALDACTONE) 50 MG tablet, Take 1 tablet (50 mg total) by mouth daily., Disp: 60 tablet, Rfl: 3  ???  umeclidinium-vilanterol (ANORO ELLIPTA) 62.5-25 mcg/actuation inhaler, Inhale 1 puff. , Disp: , Rfl:     Allergies  Lisinopril and Codeine    Family History   Problem Relation Age of Onset   ??? Breast cancer Sister 72   ??? Diabetes Mother    ??? Cataracts Mother    ??? No Known Problems Father    ??? No Known Problems Daughter    ??? No Known Problems Maternal Grandmother    ??? No Known Problems Maternal Grandfather    ??? No Known Problems Paternal Grandmother    ??? No Known Problems Paternal Grandfather    ??? BRCA 1/2 Neg Hx    ??? Cancer Neg Hx    ??? Colon cancer Neg Hx    ??? Endometrial cancer Neg Hx    ??? Ovarian cancer Neg Hx        Social History  Social History     Tobacco Use   ??? Smoking status: Former Smoker     Quit date: 02/21/2014     Years since quitting: 4.9   ??? Smokeless tobacco: Never Used   Substance Use Topics   ??? 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     ??? Drug use: Not Currently     Comment: History of IV drug use  in the 1980s.         Physical Exam     ED Triage Vitals [01/23/19 1017]   Enc Vitals Group      BP 109/69      Heart Rate 58      SpO2 Pulse       Resp 18      Temp 36.6 ??C (97.9 ??F)      Temp Source Temporal      SpO2 100 %      Weight       Height       Head Circumference       Peak Flow       Pain Score       Pain Loc       Pain Edu?       Excl. in GC?        Constitutional: Alert and oriented. Well appearing. In no distress.  Eyes: Conjunctivae are normal.  ENT       Head: Normocephalic and atraumatic.       Nose: No congestion. Mouth/Throat: Mucous membranes are moist.       Neck: No stridor. Supple, full ROM.  Cardiovascular: rate as vital signs, regular rhythm. Normal and symmetric distal pulses are present in all extremities.  Respiratory: Normal respiratory effort, symmetrical expansion of chest. No tachypnea or dyspnea.  Gastrointestinal: Soft, distended abdomen, diffusely tender without guarding or rebound.  Bedside ultrasound showed a very small fluid pocket of the left lower quadrant.  No apparent fluid pocket of right lower quadrant.  Genitourinary: Deferred  Musculoskeletal: No tenderness or edema of bilateral LEs.  Neurologic: Normal speech and language. No gross focal neurologic deficits are appreciated.  Skin: Skin is warm, dry and intact. No rash noted.    Radiology     XR Chest Portable   Final Result      CT Abdomen Pelvis W IV Contrast Only    (Results Pending)       ED Course, Assessment and Plan     ED Triage Vitals [01/23/19 1017]   Vital Signs Group      Temp 36.6 ??C (97.9 ??F)      Temp Source Temporal      Heart Rate 58      SpO2 Pulse       Heart Rate Source Monitor      Resp 18      BP 109/69      MAP (mmHg)       BP Location Left arm      BP Method Automatic      Patient Position Sitting   SpO2 100 %       Encounter Date: 01/23/19   ECG 12 lead (Adult)   Result Value    EKG Systolic BP     EKG Diastolic BP     EKG Ventricular Rate 56    EKG Atrial Rate 56    EKG P-R Interval 166    EKG QRS Duration 96    EKG Q-T Interval 432    EKG QTC Calculation 416    EKG Calculated P Axis 51    EKG Calculated R Axis 60    EKG Calculated T Axis 53    QTC Fredericia 422    Narrative    SINUS BRADYCARDIA  OTHERWISE NORMAL ECG  WHEN COMPARED WITH ECG OF 06-Oct-2018 09:41,  NO SIGNIFICANT CHANGE WAS FOUND        Impression: Chloe Moore is  a 63 y.o. female with past medical history hep C cirrhosis, presented ER for double, subacute abdominal pain without acute changes today.  Hematin stable, afebrile to ER. Similar hospitalization several months ago for SBP.    Very small pocket on bedside ultrasound.    I spoke to [facility] today for possible IR consult for paracentesis, unfortunately, their schedule is very full and apparently cannot fit patient.  I then spoke to the medicine procedure consult service, they will come to evaluate patient and determine if that the think patient is tappable from their perspective.    Will obtain CT abdomen pelvis for further evaluation of other etiology of abdominal pain.  If all negative, will loop back with the medicine procedure team to see if They can tap patient.  Given the similar presentation last time, I do have concern for possible SBP that is atypical.    2:25 PM -labs overall reassuring, no significant leukocytosis which is also similar to her last presentation.    3:00 PM -care to oncoming resident.  Pending CT.              Pertinent labs & imaging results that were available during my care of the patient were reviewed by me and considered in my medical decision making (see chart for details).     Please note- This chart has been created using [employer]. Chart creation errors have been sought, but may not always be located and such creation errors, especially pronoun confusion, do NOT reflect on the standard of medical care.        Raynald Blend, MD  Resident  01/23/19 [phone]

## 2019-01-23 NOTE — Unmapped (Signed)
Emergency Department Progress Note    January 23, 2019 3:04 PM    Assumed care of patient at shift change    Chloe Moore is a 63 y.o. with a past medical history of COPD, cirrhosis, hep C, presented to ER for abdominal pain. ??Patient states that she has progressive worsening, constant, dull, abdominal pain radiating to her bilateral back for the past 2 to 3 weeks, and that there is no acute changes today. ??No fever, no shortness of breath, no chest pain, no emesis or diarrhea.   ??  3:27 PM  CT A/P pending.     4:00 PM  CT shows:  Impression:    No findings to definitively explain the patient's acute abdominal pain.     Cirrhotic liver morphology. There is an ill-defined hypoattenuating area in the right hepatic lobe which is similar prior imaging and is nonspecific. As stated previously, recommend short-term follow-up with MRI.     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     Small-volume ascites.           4:50 PM  ED team unable to obtain tap at the time of signout, following signout procedure team unable to obtain fluid sample given no window.     Concern for SBP remains given that she originally presented on 10/08/2018 with similar symptoms of abdominal pain and discomfort with no fever or other symptoms and found to have SBP.    . Will place on SBP treatment given patient's history of the same. Will page [facility]IR and [facility] for admission for antibiotics.     4:59 PM  Spoke with [facility], they will be able to evaluate the patient tomorrow, but will likely be difficult case given small window.         Vitals:    01/23/19 1017 01/23/19 1428   BP: 109/69 112/64   Pulse: 58 55   Resp: 18 14   Temp: 36.6 ??C (97.9 ??F) 36.7 ??C (98.1 ??F)   TempSrc: Temporal Oral   SpO2: 100% 99%        Past Medical History:   Diagnosis Date   ??? Acute renal failure (ARF) (CMS-HCC)    ??? Allergic reaction caused by a drug    ??? Arthritis    ??? Asthma    ??? Asthma ??? Cirrhosis (CMS-HCC)    ??? COPD (chronic obstructive pulmonary disease) (CMS-HCC)    ??? Diabetes mellitus (CMS-HCC)    ??? Hepatitis C    ??? Hypertension    ??? Sinusitis         Past Surgical History:   Procedure Laterality Date   ??? APPENDECTOMY     ??? CARPAL TUNNEL RELEASE     ??? CHOLECYSTECTOMY     ??? HYSTERECTOMY     ??? OOPHORECTOMY     ??? PR COLON CA SCRN NOT HI RSK IND N/A 07/18/2014    Procedure: COLOREC CNCR SCR;COLNSCPY NO;  Surgeon: Bronson Curb, MD;  [facility];  Service: Gastroenterology   ??? PR UPPER GI ENDOSCOPY,DIAGNOSIS N/A 07/18/2014    Procedure: UGI ENDO, INCLUDE ESOPHAGUS, STOMACH, & DUODENUM &/OR JEJUNUM; DX W/WO COLLECTION SPECIMN, BY BRUSH OR WASH;  Surgeon: Bronson Curb, MD;  [facility];  Service: Gastroenterology   ??? PR UPPER GI ENDOSCOPY,DIAGNOSIS N/A 12/30/2018    Procedure: UGI ENDO, INCLUDE ESOPHAGUS, STOMACH, & DUODENUM &/OR JEJUNUM; DX W/WO COLLECTION SPECIMN, BY BRUSH  OR WASH;  Surgeon: Jules Husbands, MD;  [facility];  Service: Gastroenterology   ??? REDUCTION MAMMAPLASTY Bilateral         Labs Reviewed   PROTIME-INR - Abnormal; Notable for the following components:       Result Value    PT 14.4 (*)     All other components within normal limits   COMPREHENSIVE METABOLIC PANEL - Abnormal; Notable for the following components:    Sodium 133 (*)     Albumin 2.9 (*)     Total Protein 5.8 (*)     AST 71 (*)     ALT 40 (*)     Alkaline Phosphatase 165 (*)     All other components within normal limits   URINALYSIS WITH CULTURE REFLEX - Abnormal; Notable for the following components:    Specific Gravity, UA 1.002 (*)     Blood, UA Small (*)     Bacteria, UA Occasional (*)     Mucus, UA Rare (*)     All other components within normal limits   CBC W/ AUTO DIFF - Abnormal; Notable for the following components:    HGB 11.8 (*)     RDW 15.8 (*)     MPV 11.5 (*)     Absolute Lymphocytes 0.7 (*) Microcytosis Slight (*)     Hypochromasia Slight (*)     All other components within normal limits    Narrative:     Please use the Absolute Differential for reference ranges.    BILIRUBIN, DIRECT - Normal   LIPASE - Normal   MAGNESIUM - Normal   URINE CULTURE   CBC W/ DIFFERENTIAL    Narrative:     The following orders were created for panel order CBC w/ Differential.  Procedure                               Abnormality         Status                     ---------                               -----------         ------                     CBC w/ Differential[[id number]]         Abnormal            Final result                 Please view results for these tests on the individual orders.       XR Chest Portable   Final Result      CT Abdomen Pelvis W IV Contrast Only    (Results Pending)       This provider entered the patient's room: Yes:    ? If this provider did not enter the room, a comprehensive physical exam was not able to be performed due to increased infection risk to themselves, other providers, staff and other patients), as well as to conserve personal protective equipment (PPE) utilization during the COVID-19 pandemic.    ? If this provider did enter the patient room, the following was PPE worn: CAPR with face shield, gown and gloves  I have reviewed the patient's vital signs and the nursing notes. Any pertinent labs & imaging results which were available during my care of the patient were reviewed by me.     Chloe Moore was evaluated in Emergency Department at the time of this visit for the symptoms described in the history of present illness. She was evaluated in the context of the global COVID-19 pandemic, which necessitated consideration that the patient might be at risk for infection with the SARS-CoV-2 virus that causes COVID-19. Institutional protocols and algorithms that pertain to the evaluation of patients at risk for COVID-19 were followed during the patient's care in the ED. Portions of this record have been created using [employer], clinical (histocompatibility and immunogenetics). Dictation errors have been sought, but may not have been identified and corrected.        Ellard Artis, MD  Resident  01/23/19 [phone]

## 2019-01-23 NOTE — Unmapped (Signed)
Patient rounds completed. The following patient needs were addressed:  Patient continually sitting in the Team C waiting awaiting the room opening .

## 2019-01-23 NOTE — Unmapped (Signed)
Patient here with abdominal swelling that is giving her SOB. Patient reports the last time it was drained was months ago.

## 2019-01-24 LAB — COMPREHENSIVE METABOLIC PANEL
ALBUMIN: 2.5 g/dL — ABNORMAL LOW (ref 3.5–5.0)
ALKALINE PHOSPHATASE: 152 U/L — ABNORMAL HIGH (ref 38–126)
ALT (SGPT): 35 U/L — ABNORMAL HIGH (ref <35)
ANION GAP: 5 mmol/L — ABNORMAL LOW (ref 7–15)
AST (SGOT): 61 U/L — ABNORMAL HIGH (ref 14–38)
BILIRUBIN TOTAL: 0.3 mg/dL (ref 0.0–1.2)
BLOOD UREA NITROGEN: 11 mg/dL (ref 7–21)
CALCIUM: 8.4 mg/dL — ABNORMAL LOW (ref 8.5–10.2)
CHLORIDE: 104 mmol/L (ref 98–107)
CO2: 28 mmol/L (ref 22.0–30.0)
CREATININE: 0.94 mg/dL (ref 0.60–1.00)
EGFR CKD-EPI AA FEMALE: 74 mL/min/{1.73_m2} (ref >=60)
EGFR CKD-EPI NON-AA FEMALE: 64 mL/min/{1.73_m2} (ref >=60)
GLUCOSE RANDOM: 71 mg/dL (ref 70–179)
POTASSIUM: 3.4 mmol/L — ABNORMAL LOW (ref 3.5–5.0)
PROTEIN TOTAL: 5.2 g/dL — ABNORMAL LOW (ref 6.5–8.3)
SODIUM: 137 mmol/L (ref 135–145)

## 2019-01-24 LAB — EGFR CKD-EPI AA FEMALE: Lab: 74

## 2019-01-24 MED ORDER — POLYETHYLENE GLYCOL 3350 17 GRAM ORAL POWDER PACKET: 17 g | packet | Freq: Every day | 0 refills | 30 days | Status: AC

## 2019-01-24 MED ADMIN — polyethylene glycol (MIRALAX) packet 17 g: 17 g | ORAL | @ 15:00:00 | Stop: 2019-01-24

## 2019-01-24 MED ADMIN — senna (SENOKOT) tablet 2 tablet: 2 | ORAL | @ 01:00:00 | Stop: 2019-01-24

## 2019-01-24 MED ADMIN — spironolactone (ALDACTONE) tablet 50 mg: 50 mg | ORAL | @ 01:00:00 | Stop: 2019-01-24

## 2019-01-24 MED ADMIN — amLODIPine (NORVASC) tablet 10 mg: 10 mg | ORAL | @ 15:00:00 | Stop: 2019-01-24

## 2019-01-24 MED ADMIN — rifAXIMin (XIFAXAN) tablet 550 mg: 550 mg | ORAL | @ 02:00:00 | Stop: 2019-01-24

## 2019-01-24 MED ADMIN — lactulose (CHRONULAC) oral solution: 30 g | ORAL | @ 02:00:00 | Stop: 2019-01-24

## 2019-01-24 MED ADMIN — gadobenate dimeglumine (MULTIHANCE) 529 mg/mL (0.1mmol/0.2mL) solution 9 mL: 9 mL | INTRAVENOUS | @ 13:00:00 | Stop: 2019-01-24

## 2019-01-24 MED ADMIN — pregabalin (LYRICA) capsule 100 mg: 100 mg | ORAL | @ 15:00:00 | Stop: 2019-01-24

## 2019-01-24 MED ADMIN — potassium chloride (KLOR-CON) CR tablet 20 mEq: 20 meq | ORAL | @ 20:00:00 | Stop: 2019-01-24

## 2019-01-24 MED ADMIN — oxyCODONE (ROXICODONE) immediate release tablet 10 mg: 10 mg | ORAL | @ 01:00:00 | Stop: 2019-01-24

## 2019-01-24 MED ADMIN — furosemide (LASIX) tablet 40 mg: 40 mg | ORAL | @ 15:00:00 | Stop: 2019-01-24

## 2019-01-24 MED ADMIN — escitalopram oxalate (LEXAPRO) tablet 5 mg: 5 mg | ORAL | @ 15:00:00 | Stop: 2019-01-24

## 2019-01-24 MED ADMIN — lactulose (CHRONULAC) oral solution: 30 g | ORAL | @ 15:00:00 | Stop: 2019-01-24

## 2019-01-24 MED ADMIN — amLODIPine (NORVASC) tablet 10 mg: 10 mg | ORAL | @ 01:00:00 | Stop: 2019-01-24

## 2019-01-24 MED ADMIN — atorvastatin (LIPITOR) tablet 20 mg: 20 mg | ORAL | @ 01:00:00 | Stop: 2019-01-24

## 2019-01-24 MED ADMIN — enoxaparin (LOVENOX) syringe 40 mg: 40 mg | SUBCUTANEOUS | @ 01:00:00 | Stop: 2019-01-24

## 2019-01-24 MED ADMIN — umeclidinium-vilanteroL (ANORO ELLIPTA) 62.5-25 mcg/actuation inhaler 1 puff: 1 | RESPIRATORY_TRACT | @ 15:00:00 | Stop: 2019-01-24

## 2019-01-24 NOTE — Unmapped (Signed)
ED Progress Note    Received s/o from prior attending. Briefly, this is a 63 y/o HCV, cirrhosis who presented with abdominal pain. Prior ED team unable to get a reliable and safe para. She had SBP earlier this year with similar presentation. IM procedure team was consulted. Plan was f/u IM procedure team.  IM team unable to get safe para. Will consult [facility] to try para tmrw. Will cover empirically with abx. Admit to medicine.    [id number]:  No findings to definitively explain the patient's acute abdominal pain.  ??  Cirrhotic liver morphology. There is an ill-defined hypoattenuating area in the right hepatic lobe which is similar prior imaging and is nonspecific. As stated previously, recommend short-term follow-up with MRI.  ??  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  ??  Small-volume ascites.

## 2019-01-24 NOTE — Unmapped (Signed)
Medicine History and Physical    Assessment/Plan:    Principal Problem:    Generalized abdominal pain  Active Problems:    Other cirrhosis of liver (CMS-HCC)    Ascites  Resolved Problems:    * No resolved hospital problems. *      Chloe Moore is a 63 y.o. 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  Further treatment plans outlined below.    Subacute upper abdominal pain: Presented with constant dull ache >RUQ for approximately 6 months. Differential diagnosis includes pain related to nonspecific lesions in right hepatic lobe (malignancy?), pain related to pancreatic cystic lesions, PVT, SBP, constipation, or IBS.  Exam shows to tenderness in RUQ and epigastrium.  CT AP (12/14) with ill-defined area in right hepatic lobe, numerous cystic lesions in pancreas relatively unchanged, and small volume ascites. previously saw GI in September 2020 with similar CTAP at that time.  It was planned to perform MRI abdomen in January 2021.  Chart review shows 50 pound weight loss since April 2019.  Although past US liver had shown partially occlusive thrombus and portal vein, most recent with patent hepatic vasculature.  Low suspicion for SBP at this time since she has never had this complication (last diagnostic para with <250 PMNs) and no other signs of infection.  Plan to hold antibiotics and obtain MRI for further characterization.  ?? Hold antibiotics  ?? MiraLAX, senna  ?? Pain control: Oxy 5/10mg   ?? MRI abdomen with/without contrast Dyspnea on exertion: Present for approximately 3 months and worse with increased abdominal swelling.  Able to ambulate around her home, but often slowly.  No evidence of hypervolemia on exam or pulmonary edema on CXR.    proBNP normal and EKG unremarkable.  Echocardiogram (07/28/2017) with EF =60-65%, LVH, and DDII.  Possibly related to abdominal pain and distention versus mild hypervolemia.  Plan to continue home diuresis regimen with PT/OT evaluation.  ?? Consider more aggressive diuresis  ?? PT/OT    Decompensated cirrhosis secondary to HCV/EtOH??c/b ascites, HE:??Follows with [facility] hepatology. Previous HCV successfully treated with Harvoni.  In past admissions, briefly treated for SBP although diagnostic paracentesis not consistent with SBP (<250 PMNs). No longer consumes alcohol or uses drugs.  Admission LFTs near baseline (AST 71, ALT 40, ALP 165) with normal bilirubin.  MELD 8.  ?? Lasix 40 mg, spironolactone 50 mg  ?? Lactulose, rifaximin  ??  Chronic Medical Conditions:  HTN: Amlodipine 10 mg QD, HCTZ 25 mg  HLD: Atorvastatin 20 mg   Depression: Escitalopram 5 mg QD  T2DM:??holding home??Lingliptin??and??Metformin. SSI  Neuropathy: Gabapentin 300 mg TID, Oxycodone 5 mg QID PRN    Daily Checklist  Diet: Regular  IVF: None  Electrolytes: Replete as needed  DVT ppx: Lovenox  GI ppx: N/A  Disposition: Observation  ___________________________________________________________________    Chief Complaint:  Chief Complaint   Patient presents with   ??? Abdominal Pain     Generalized abdominal pain    HPI:  Chloe Moore is a 63 y.o. 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 Patient states that 6 months ago her abdomen began swelling and she required paracentesis for drainage.  A few weeks after, the patient was admitted to  [facility] for constant, dull abdominal pain that radiated to her back.  She was ultimately treated for SBP, although diagnostic paracentesis did not meet diagnostic criteria for SBP at that time.  Since that admission, she has continued to have similar abdominal pain.  She feels that it has been worsening over the last several weeks.  Worse in the right upper abdomen.  Described as a dull, constant ache that radiates to her back.  Endorses the sensation of her abdomen enlarging.  States it is worse with eating because she has a fullness sensation.  Describes subjective warmth with some sweating.  Some difficulty eating due to early satiety.  Experiences some nausea, but no vomiting.  Endorses some dyspnea on exertion, more which has also been present for approximately 3 months.  Feels that this dyspnea is worse when her abdomen more swollen.  States she has lost approximately 15 pounds over the last several months.  Denies fever, palpitations, lower extremity edema, hematemesis, hematochezia, melena, or cough.    In the ER, patient was hemodynamically stable, not tachycardic, and afebrile.  Laboratory studies were significant for elevated LFTs (AST 71, ALT 40, ALP 165), that appear near baseline.  Lipase negative.  A diagnostic paracentesis was not obtainable by the ER or medicine procedures team due to no pocket.  CXR (12/14) without opacity or edema.  CT AP (12/14) with cirrhotic liver, ill-defined area in right hepatic lobe, numerous cystic lesions in pancreas relatively unchanged, and small volume ascites.  Patient received cefepime x1 out of concern for SBP.  It is in this setting that she presents for admission to the general medicine team.    Allergies:  Lisinopril and Codeine    Medications:   Prior to Admission medications    Medication Dose, Route, Frequency albuterol 2.5 mg /3 mL (0.083 %) nebulizer solution 2.5 mg, Inhalation, Every 6 hours PRN   amLODIPine (NORVASC) 10 MG tablet 10 mg, Oral, Daily (standard)   aspirin 81 MG chewable tablet 81 mg, Oral, Daily (standard)   atorvastatin (LIPITOR) 20 MG tablet 1 tablet, Oral, continuous TPN without 24 hrs.   blood sugar diagnostic Strp Check blood sugars six times a day. 250.62 Uncontrolled diabetes, uses insulin.   diaper,brief,adult,disposable (ADULT BRIEF - EXTRA LARGE) Misc Use as directed   diclofenac sodium (VOLTAREN) 1 % gel 2 g, Topical, Daily PRN   escitalopram oxalate (LEXAPRO) 5 MG tablet No dose, route, or frequency recorded.   furosemide (LASIX) 20 MG tablet 40 mg, Oral, Daily (standard)   gabapentin (NEURONTIN) 300 MG capsule 300 mg, 3 times a day (standard)   hydroCHLOROthiazide (HYDRODIURIL) 25 MG tablet 1 tablet, Oral, continuous TPN without 24 hrs.   lactulose (CHRONULAC) 10 gram/15 mL solution 45 mL, Oral, 2 times a day (standard)   lancets Misc Use to check blood sugars six times a day.   linaCLOtide (LINZESS) 290 mcg capsule 290 mcg, Oral, Daily (standard)   linagliptin (TRADJENTA) 5 mg Tab 5 mg, Oral, Daily (standard)   metFORMIN (GLUCOPHAGE) 500 MG tablet 500 mg, Oral, 2 times a day with meals   metoprolol tartrate (LOPRESSOR) 100 MG tablet 50 mg, Oral, 2 times a day (standard)   oxyCODONE (ROXICODONE) 5 MG immediate release tablet 1 tablet, Oral, 4 times daily PRN   pen needle, diabetic 32 gauge x 5/32 Ndle Use to give insulin 6 times a day.   PNEUMOVAX 23 25 mcg/0.5 mL Syrg injection PHARMACIST ADMINISTERED IMMUNIZATION ADMINISTERED AT TIME OF DISPENSING   rifAXIMin (XIFAXAN) 550  mg Tab 550 mg, Oral, 2 times a day (standard)   spironolactone (ALDACTONE) 50 MG tablet 50 mg, Oral, Daily (standard)   umeclidinium-vilanterol (ANORO ELLIPTA) 62.5-25 mcg/actuation inhaler 1 puff, Inhalation       Medical History:  Past Medical History:   Diagnosis Date   ??? Acute renal failure (ARF) (CMS-HCC) ??? Allergic reaction caused by a drug    ??? Arthritis    ??? Asthma    ??? Asthma    ??? Cirrhosis (CMS-HCC)    ??? COPD (chronic obstructive pulmonary disease) (CMS-HCC)    ??? Diabetes mellitus (CMS-HCC)    ??? Hepatitis C    ??? Hypertension    ??? Sinusitis        Surgical History:  Past Surgical History:   Procedure Laterality Date   ??? APPENDECTOMY     ??? CARPAL TUNNEL RELEASE     ??? CHOLECYSTECTOMY     ??? HYSTERECTOMY     ??? OOPHORECTOMY     ??? PR COLON CA SCRN NOT HI RSK IND N/A 07/18/2014    Procedure: COLOREC CNCR SCR;COLNSCPY NO;  Surgeon: Bronson Curb, MD;  [facility]: [facility];  Service: Gastroenterology   ??? PR UPPER GI ENDOSCOPY,DIAGNOSIS N/A 07/18/2014    Procedure: UGI ENDO, INCLUDE ESOPHAGUS, STOMACH, & DUODENUM &/OR JEJUNUM; DX W/WO COLLECTION SPECIMN, BY BRUSH OR WASH;  Surgeon: Bronson Curb, MD;  Location: [facility];  Service: Gastroenterology   ??? PR UPPER GI ENDOSCOPY,DIAGNOSIS N/A 12/30/2018    Procedure: UGI ENDO, INCLUDE ESOPHAGUS, STOMACH, & DUODENUM &/OR JEJUNUM; DX W/WO COLLECTION SPECIMN, BY BRUSH OR WASH;  Surgeon: Jules Husbands, MD;  [facility]: [facility];  Service: Gastroenterology   ??? REDUCTION MAMMAPLASTY Bilateral        Social History:  Social History     Socioeconomic History   ??? Marital status: Single     Spouse name: Not on file   ??? Number of children: Not on file   ??? Years of education: Not on file   ??? Highest education level: Not on file   Occupational History   ??? Not on file   Social Needs   ??? Financial resource strain: Not on file   ??? Food insecurity     Worry: Never true     Inability: Never true   ??? Transportation needs     Medical: Not on file     Non-medical: Not on file   Tobacco Use   ??? Smoking status: Former Smoker     Quit date: 02/21/2014     Years since quitting: 4.9   ??? Smokeless tobacco: Never Used   Substance and Sexual Activity   ??? Alcohol use: Not Currently     Alcohol/week: 0.0 standard drinks Comment: Previous heavy alcohol use, on average > 1 pint of liquor per day.     ??? 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   ??? Sexual activity: Never   Lifestyle   ??? Physical activity     Days per week: Not on file     Minutes per session: Not on file   ??? Stress: Not on file   Relationships   ??? Social Wellsite geologist on phone: Not on file     Gets together: Not on file     Attends religious service: Not on file     Active member of club or  organization: Not on file     Attends meetings of clubs or organizations: Not on file     Relationship status: Not on file   Other Topics Concern   ??? Not on file   Social History Narrative    In assisted living        Family History:  Family History   Problem Relation Age of Onset   ??? Breast cancer Sister 83   ??? Diabetes Mother    ??? Cataracts Mother    ??? No Known Problems Father    ??? No Known Problems Daughter    ??? No Known Problems Maternal Grandmother    ??? No Known Problems Maternal Grandfather    ??? No Known Problems Paternal Grandmother    ??? No Known Problems Paternal Grandfather    ??? BRCA 1/2 Neg Hx    ??? Cancer Neg Hx    ??? Colon cancer Neg Hx    ??? Endometrial cancer Neg Hx    ??? Ovarian cancer Neg Hx        Review of Systems:  10 systems reviewed and are negative unless otherwise mentioned in HPI    Labs/Studies:  Labs per EMR and Reviewed (last 24hrs) and   CBC - Results in Past 2 Days  Result Component Current Result   WBC 7.5 (01/23/2019)   RBC 4.47 (01/23/2019)   HGB 11.8 (L) (01/23/2019)   HCT 37.2 (01/23/2019)   MCV 83.3 (01/23/2019)   MCH 26.5 (01/23/2019)   MCHC 31.8 (01/23/2019)   MPV 11.5 (H) (01/23/2019)   Platelet 195 (01/23/2019)     BMP - Results in Past 2 Days  Result Component Current Result   Sodium 133 (L) (01/23/2019)   Potassium 4.0 (01/23/2019)   Chloride 102 (01/23/2019)   CO2 23.0 (01/23/2019)   BUN 12 (01/23/2019)   Creatinine 0.97 (01/23/2019)   EST.GFR (MDRD) Not in Time Range   Glucose 104 (01/23/2019) Physical Exam:  Temp:  [36.6 ??C-36.8 ??C] 36.8 ??C  Heart Rate:  [55-65] 58  Resp:  [14-19] 19  BP: (109-124)/(55-74) 116/70  SpO2:  [99 %-100 %] 100 %    Constitutional: NAD. Well nourished.  Eyes: Conjunctivae w/o injection or edema. No jaundice.   ENT: MMM. Trachea midline. NCAT.  C.V.: RRR.  II/VI SEM at LUSB.  Respiratory: No wheezes or crackles.  Normal work of breathing.  G.I.: Soft.  Mildly distended.  Tender in the epigastrium and RUQ.  MSK: No joint/extremity tenderness, effusion, or deformity.  Extremities: No LE edema.  Skin: Warm and dry. No eruptions, ecchymoses, petechia, purpura, cyanosis, or jaundice.  Neuro: A&Ox3. No focal deficits. Sensation grossly intact. Normal bulk and tone  Psych: Judgement and insight appropriate. Fluent speech.

## 2019-01-24 NOTE — Unmapped (Signed)
Pt A&Ox4, calm, pleasant, round/distended abdomen but soft. Pt ambulated in hall and room to bed with small amount of SOB. Pt reports pain in feet and legs from neuropathy more than abdominal pain.  Pt switched to lyrica per home meds.  Pt reports she ate dinner in ER before coming to unit, declined snack, but tolerating drinking ice water. Pt resting and sleeping comfortably, requested oxycodone once tonight as q6 hrs.

## 2019-01-24 NOTE — Unmapped (Addendum)
Chloe Moore is a 63 y.o. 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  Further treatment plans outlined below.  ??  Subacute upper abdominal pain: Presented with constant dull ache >RUQ for approximately 6 months. Exam showed to tenderness in RUQ and epigastrium.  Lipase normal.  Bilirubin not suggestive of biliary obstruction.  Chart review shows 50 pound weight loss since April 2019.  CT AP (12/14) with ill-defined area in right hepatic lobe, numerous cystic lesions in pancreas relatively unchanged, and small volume ascites.  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  Low suspicion for SBP at this time since she has never had this complication (last diagnostic para with <250 PMNs) and no other signs of infection.  Briefly received antibiotics in the ER, but these were discontinued on admission.  It appears that this pain has been present dating back to as far as 2008.  Query whether pancreatic cyst may be playing a role in patient's pain.  Further, may represent IBS-like picture with some improvement noted on defecation.  Follow-up plan with hepatology (12/29) and further work-up may be considered at that time. Dyspnea on exertion: Present for approximately 3 months and worse with increased abdominal swelling.  Able to ambulate around her home, but often slowly.  No evidence of hypervolemia on exam or pulmonary edema on CXR.   proBNP normal and EKG unremarkable.  Echocardiogram (07/28/2017) with EF =60-65%, LVH, and DDII.  Possibly related to abdominal pain and distention versus COPD.   Follows with outpatient pulmonology and recommended patient follow-up for further evaluation if symptoms continue to be problematic.  PT/OT evaluated patient and did not feel that physical therapy services are indicated post discharge.  ??  Decompensated cirrhosis secondary to HCV/EtOH??c/b ascites, HE:??Follows with [facility] hepatology. Previous HCV successfully treated with Harvoni.  In past admissions, briefly treated for SBP although diagnostic paracentesis not consistent with SBP (<250 PMNs). No longer consumes alcohol or uses drugs.  Admission LFTs near baseline (AST 71, ALT 40, ALP 165) with normal bilirubin.  MELD 8.  Continued Lasix 40 mg, spironolactone 50 mg for diuresis and lactulose, rifaximin for HE.  ??  Chronic Medical Conditions:  COPD: Continued home inhaler regimen.  HTN: Amlodipine 10 mg every day. Stopped HCTZ 25 mg in setting of cirrhosis and normotension.  HLD: Atorvastatin 20 mg   Depression: Escitalopram 5 mg QD  T2DM:??Continue home linagliptin and Metformin at discharge.  Neuropathy: Gabapentin 300 mg TID, Oxycodone 5 mg QID PRN

## 2019-01-24 NOTE — Unmapped (Signed)
OCCUPATIONAL THERAPY  Evaluation(co-evaluation with Arvid Right, PT for safe pt mobility) (01/24/19 1345)    Patient Name:  Chloe Moore       Medical Record Number: [id number]   Date of Birth: 10-Apr-1955  Sex: Female          OT Treatment Diagnosis:  SOB affecting pt's ability to complete daily routine    Assessment  Problem List: Pain  Assessment: Summer Ivins is a 63 y.o. 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 Pt is at functional baseline and is able to complete all ADLs, functional transfers, and functional mobility without difficulty. OT providing pt with energy conservation booklet and pt demonstrating good carryover with patient education. Patient is now limited by problem list as mentioned above, impacting ADL/functional mobility performance and safety. Based on consideration of pt's occupational profile, assessment review, level of clinical decision making involved, and intervention plan, this pt is considered to be a moderate complexity case.  Today's Interventions: supine to sit, sit <> stand, LB dressing, toilet transfer, and functional mobility. OT educating pt re: energy conservation, pacing, role of OT, and OT POC.    Activity Tolerance During Today's Session  Patient tolerated treatment well    Plan  Planned Frequency of Treatment:  D/C Services for: D/C Services     Post-Discharge Occupational Therapy Recommendations:  OT Post Acute Discharge Recommendations: OT services not indicated      GOALS:   Patient and Family Goals: to figure out what is wrong    Prognosis:  Excellent  Positive Indicators:  Pt is at baseline  Barriers to Discharge: None    Subjective  Current Status Pt recieved supine in bed. Pt left seated at EOB with call bell and all needs in reach. RN aware. Prior Functional Status Pt is independent for all ADLs and IADLs without an AD. Pt denies any recent falls and states that family drives to take to medical appointments and church.    Medical Tests / Procedures: Reviewed in Epic       Patient / Caregiver reports: I want to talk to the doctor and know what is going on    Past Medical History:   Diagnosis Date   ??? Acute renal failure (ARF) (CMS-HCC)    ??? Allergic reaction caused by a drug    ??? Arthritis    ??? Asthma    ??? Asthma    ??? Cirrhosis (CMS-HCC)    ??? COPD (chronic obstructive pulmonary disease) (CMS-HCC)    ??? Diabetes mellitus (CMS-HCC)    ??? Hepatitis C    ??? Hypertension    ??? Sinusitis     Social History     Tobacco Use   ??? Smoking status: Former Smoker     Quit date: 02/21/2014     Years since quitting: 4.9   ??? Smokeless tobacco: Never Used   Substance Use Topics   ??? 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        Past Surgical History:   Procedure Laterality Date   ??? APPENDECTOMY     ??? CARPAL TUNNEL RELEASE     ??? CHOLECYSTECTOMY     ??? HYSTERECTOMY     ??? OOPHORECTOMY     ???  PR COLON CA SCRN NOT HI RSK IND N/A 07/18/2014    Procedure: COLOREC CNCR SCR;COLNSCPY NO;  Surgeon: Bronson Curb, MD;  Location: [facility];  Service: Gastroenterology   ??? PR UPPER GI ENDOSCOPY,DIAGNOSIS N/A 07/18/2014    Procedure: UGI ENDO, INCLUDE ESOPHAGUS, STOMACH, & DUODENUM &/OR JEJUNUM; DX W/WO COLLECTION SPECIMN, BY BRUSH OR WASH;  Surgeon: Bronson Curb, MD;  Location: [facility];  Service: Gastroenterology   ??? PR UPPER GI ENDOSCOPY,DIAGNOSIS N/A 12/30/2018    Procedure: UGI ENDO, INCLUDE ESOPHAGUS, STOMACH, & DUODENUM &/OR JEJUNUM; DX W/WO COLLECTION SPECIMN, BY BRUSH OR WASH;  Surgeon: Jules Husbands, MD;  Location: [facility];  Service: Gastroenterology   ??? REDUCTION MAMMAPLASTY Bilateral Family History   Problem Relation Age of Onset   ??? Breast cancer Sister 62   ??? Diabetes Mother    ??? Cataracts Mother    ??? No Known Problems Father    ??? No Known Problems Daughter    ??? No Known Problems Maternal Grandmother    ??? No Known Problems Maternal Grandfather    ??? No Known Problems Paternal Grandmother    ??? No Known Problems Paternal Grandfather    ??? BRCA 1/2 Neg Hx    ??? Cancer Neg Hx    ??? Colon cancer Neg Hx    ??? Endometrial cancer Neg Hx    ??? Ovarian cancer Neg Hx         Lisinopril and Codeine     Objective Findings  Precautions / Restrictions  Non-applicable    Weight Bearing  Non-applicable    Required Braces or Orthoses  Non-applicable    Pain  No c/o pain    Equipment / Environment  Vascular access (PIV, TLC, Port-a-cath, PICC), Patient not wearing mask for full session    Living Situation  Living Environment: House  Lives With: Alone  Home Living: One level home, Tub/shower unit, Standard height toilet, Grab bars around toilet, Stairs to enter with rails  Rail placement (outside): Rail on left side  Number of Stairs: 3     Cognition   Orientation Level:  Oriented x 4   Arousal/Alertness:  Appropriate responses to stimuli   Attention Span:  Appears intact   Memory:  Appears intact   Following Commands:  Follows all commands and directions without difficulty   Safety Judgment:  Good awareness of safety precautions   Awareness of Errors:  Good awareness of safety precautions   Problem Solving:  Able to problem solve independently    Vision / Perception      Vision: Wears glasses for reading only     Hand Function   B grip strength WNL    Skin Inspection  all visible skin intact    ROM / Strength/Coordination  UE ROM/ Strength/ Coordination: B UE WNL  LE ROM/ Strength/ Coordination: B LE WFL    Sensation:  Pt endorsing B LE neuropathy    Balance:  independent for all static and dyanmic standing balance    Functional Mobility  Transfer Assistance Needed: No  Bed Mobility Assistance Needed: No    ADLs ADLs: Independent    Vitals / Orthostatics  At Rest: NAD  With Activity: NAD  Orthostatics: asymptomatic    Medical Staff Made Aware: RN updated and aware  Occupational Therapy Session Duration  OT Individual - Duration: 24       I attest that I have reviewed the above information.  Signed: Marena Chancy  [place], [place]  Ceasar Mons 01/24/2019

## 2019-01-24 NOTE — Unmapped (Signed)
Chloe Moore was admitted with c/o abdominal pain. She has hx cirrhosis. She is alert and oriented x4 and indendent with all activities. Ambulating in room without difficulty. She had MRI of abdomen this AM. Blood sugars stable. Tolerating regular diet without n/v. Potassium supplement given today. Abdomen soft and round with active bowel sounds. Oxy given for bilateral foot pain. Voiding without difficulty.  Problem: Adult Inpatient Plan of Care  Goal: Plan of Care Review  Outcome: Progressing  Goal: Patient-Specific Goal (Individualization)  Outcome: Progressing  Goal: Absence of Hospital-Acquired Illness or Injury  Outcome: Progressing  Goal: Optimal Comfort and Wellbeing  Outcome: Progressing  Goal: Readiness for Transition of Care  Outcome: Progressing  Goal: Rounds/Family Conference  Outcome: Progressing     Problem: Diabetes Comorbidity  Goal: Blood Glucose Level Within Desired Range  Outcome: Progressing     Problem: Hypertension Comorbidity  Goal: Blood Pressure in Desired Range  Outcome: Progressing

## 2019-01-24 NOTE — Unmapped (Signed)
PHYSICAL THERAPY  Evaluation(Co-eval with Bonnita Nasuti, OT for anticipated decreased endurance) (01/24/19 1428)     Patient Name:  Chloe Moore       Medical Record Number: [id number]   Date of Birth: 1956-01-01  Sex: Female            Treatment Diagnosis: Decreased endurance    ASSESSMENT  Problem List: Decreased endurance, Shortness of breath, Gait deviation     Assessment : Lita Koenig is a 64 y.o. 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 Pt received to PT today willing to work with PT. Pt was able to perform all functional mobility task, transfers, ambulation and stairs independently without any need of physical assist. Pt had appeared to have no shortness of breath and reported feeling good. Pt was able to dual task with conversation and maintain gait speed without loss of balance. Pt appears to be functioning at baseline and required no cues. At this time, patient does not require skilled PT during acute stay or post-acute. After review of personal factors/comorbidities, body systems exam, acute hospital problems, current medical presentation and required clinical decision making, evaluation deemed as low complexity.     Today's Interventions: Eval; be mobility; supine<>sit; sit<>stand; hallway ambulation; stair navigation; pt ed for energy conservation and home exercise on importance of mobility; all questions answered; RN updated                    Clinical Decision Making: Low     PLAN  Planned Frequency of Treatment:  D/C Services for: D/C Services      Planned Interventions:      Post-Discharge Physical Therapy Recommendations:  PT services not indicated    PT DME Recommendations: None           Goals:   Patient and Family Goals: Pt wants to get better and know what's wrong Prognosis:  Good  Positive Indicators: PLOF; family support; motivated  Barriers to Discharge: None    SUBJECTIVE  Patient reports: I know what to do. I sit when I need to sit.  Current Functional Status: Pt/RN Mardene Celeste agreeable to PT eval/treat; pt received supine in bed; pt amb without device; session ended with pt supine in bed; all needs within reach; RN updated     Prior Functional Status: Pt reports being independent with all ADLs, ambulation and transfers; pt does not drive; pt's sister or nephew drive her; Pt got rid of rollator and other equipment; reports no falls in prior 6 months  Equipment available at home: None     Past Medical History:   Diagnosis Date   ??? Acute renal failure (ARF) (CMS-HCC)    ??? Allergic reaction caused by a drug    ??? Arthritis    ??? Asthma    ??? Asthma    ??? Cirrhosis (CMS-HCC)    ??? COPD (chronic obstructive pulmonary disease) (CMS-HCC)    ??? Diabetes mellitus (CMS-HCC)    ??? Hepatitis C    ??? Hypertension    ??? Sinusitis     Social History     Tobacco Use   ??? Smoking status: Former Smoker     Quit date: 02/21/2014     Years since quitting: 4.9   ??? Smokeless tobacco: Never Used   Substance Use Topics   ??? Alcohol use: Not Currently  Alcohol/week: 0.0 standard drinks     Comment: Previous heavy alcohol use, on average > 1 pint of liquor per day.        Past Surgical History:   Procedure Laterality Date   ??? APPENDECTOMY     ??? CARPAL TUNNEL RELEASE     ??? CHOLECYSTECTOMY     ??? HYSTERECTOMY     ??? OOPHORECTOMY     ??? PR COLON CA SCRN NOT HI RSK IND N/A 07/18/2014    Procedure: COLOREC CNCR SCR;COLNSCPY NO;  Surgeon: Bronson Curb, MD;  [facility];  Service: Gastroenterology   ??? PR UPPER GI ENDOSCOPY,DIAGNOSIS N/A 07/18/2014 Procedure: UGI ENDO, INCLUDE ESOPHAGUS, STOMACH, & DUODENUM &/OR JEJUNUM; DX W/WO COLLECTION SPECIMN, BY BRUSH OR WASH;  Surgeon: Bronson Curb, MD;  [facility];  Service: Gastroenterology   ??? PR UPPER GI ENDOSCOPY,DIAGNOSIS N/A 12/30/2018    Procedure: UGI ENDO, INCLUDE ESOPHAGUS, STOMACH, & DUODENUM &/OR JEJUNUM; DX W/WO COLLECTION SPECIMN, BY BRUSH OR WASH;  Surgeon: Jules Husbands, MD;  [facility];  Service: Gastroenterology   ??? REDUCTION MAMMAPLASTY Bilateral     Family History   Problem Relation Age of Onset   ??? Breast cancer Sister 38   ??? Diabetes Mother    ??? Cataracts Mother    ??? No Known Problems Father    ??? No Known Problems Daughter    ??? No Known Problems Maternal Grandmother    ??? No Known Problems Maternal Grandfather    ??? No Known Problems Paternal Grandmother    ??? No Known Problems Paternal Grandfather    ??? BRCA 1/2 Neg Hx    ??? Cancer Neg Hx    ??? Colon cancer Neg Hx    ??? Endometrial cancer Neg Hx    ??? Ovarian cancer Neg Hx         Allergies: Lisinopril and Codeine                Objective Findings  Precautions / Restrictions  Precautions: Non-applicable  Weight Bearing Status: Non-applicable  Required Braces or Orthoses: Non-applicable    Communication Preference: Verbal   Pain Comments: Pt reports no pain; pt currently medicated; RN aware  Medical Tests / Procedures: EMR reviewed prior to session  Equipment / Environment: Vascular access (PIV, TLC, Port-a-cath, PICC)    At Rest: VSS  With Activity: VSS; NAD  Orthostatics: Asymptomatic  Airway Clearance: functional mob; amb    Living Situation  Living Environment: House  Lives With: Alone  Home Living: One level home, Tub/shower unit, Standard height toilet, Grab bars around toilet, Stairs to enter with rails  Rail placement (outside): Rail on left side  Number of Stairs: 3     Cognition: A&Ox4; answered all quesitons appropriately Visual / Perception Status: pt wears glasses for reading; no acute changes  Skin Inspection: Visible skin appears intact    UE ROM: WFL  UE Strength: WFL  LE ROM: WFL  LE Strength: WFL                       Sensation: Pt reports tingling and pain from neuropathy in BLE in feet and ankles; intact to light touch  Balance: Pt has approriate static sitting, static standing, dynamic and ambulatory balance with no LOB or need for assist   Posture: Pt performs all active with upright posture     Bed Mobility: Pt independent with rolling and  supine<>sit  Transfers: Pt independent with sit<>stand   Gait  Level of Assistance: Independent  Assistive Device: None  Distance Ambulated (ft): 250 ft  Gait: Pt amb with upright posture and wide BOS; mild lateral sway present during amb; pt able to dual task by holding conversation while maintaining similar gait speed throughout without evidence of SOB  Stairs: independent with 3 stairs up and down with use of unilateral railing      Endurance: Pt reports having ocassional SOB during longer functional mobility tasks and ambulation; however, pt appears to have no SOB today and functioning at her baseline    Physical Therapy Session Duration  PT Individual - Duration: 24    Medical Staff Made Aware: RN updated    I attest that I have reviewed the above information.  Signed: Dorthula Nettles, PT  Filed 01/24/2019   The care for this patient was completed by Dorthula Nettles, PT:  A student was present and participated in the care. Licensed therapist was physically present and immediately available to direct and supervise tasks that were related to patient management. The direction and supervision was continuous throughout the time these tasks were performed.    Dorthula Nettles, PT

## 2019-01-24 NOTE — Unmapped (Signed)
Physician Discharge Summary [facility]  [facility] NEUROLOGY [facility] H[place] SURGE  [address]  [place] [address]  Dept: [phone]  Loc: [phone]     Identifying Information:   Chloe Moore  March 10, 1955  [id number]    Primary Care Physician: Elvera Maria, MD   Code Status: Full Code    Admit Date: 01/23/2019    Discharge Date/Time: 01/24/2019 2:51 PM    Discharge To: Home    Discharge Service: [facility] - GEN Med W Teaching (MDW)     Discharge Attending Physician: Ivor Reining, MD    Discharge Diagnoses:  Principal Problem:    Generalized abdominal pain  Active Problems:    Other cirrhosis of liver (CMS-HCC)    Ascites  Resolved Problems:    * No resolved hospital problems. *      Outpatient Provider Follow Up Issues:   ?? Re-evaluate abdominal pain and evolution  ?? Follow up with hepatology to get thoughts on MRI abdomen and possible further workup    Hospital Course:   Charmeka Spindle is a 63 y.o. 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  Further treatment plans outlined below. Subacute upper abdominal pain: Presented with constant dull ache >RUQ for approximately 6 months. Exam showed to tenderness in RUQ and epigastrium.  Lipase normal.  Bilirubin not suggestive of biliary obstruction.  Chart review shows 50 pound weight loss since April 2019.  CT AP (12/14) with ill-defined area in right hepatic lobe, numerous cystic lesions in pancreas relatively unchanged, and small volume ascites.  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  Low suspicion for SBP at this time since she has never had this complication (last diagnostic para with <250 PMNs) and no other signs of infection.  Briefly received antibiotics in the ER, but these were discontinued on admission.  It appears that this pain has been present dating back to as far as 2008.  Query whether pancreatic cyst may be playing a role in patient's pain.  Further, may represent IBS-like picture with some improvement noted on defecation.  Follow-up plan with hepatology (12/29) and further work-up may be considered at that time.    Dyspnea on exertion: Present for approximately 3 months and worse with increased abdominal swelling.  Able to ambulate around her home, but often slowly.  No evidence of hypervolemia on exam or pulmonary edema on CXR.   proBNP normal and EKG unremarkable.  Echocardiogram (07/28/2017) with EF =60-65%, LVH, and DDII.  Possibly related to abdominal pain and distention versus COPD.   Follows with outpatient pulmonology and recommended patient follow-up for further evaluation if symptoms continue to be problematic.  PT/OT evaluated patient and did not feel that physical therapy services are indicated post discharge. Decompensated cirrhosis secondary to HCV/EtOH??c/b ascites, HE:??Follows with [facility] hepatology. Previous HCV successfully treated with Harvoni.  In past admissions, briefly treated for SBP although diagnostic paracentesis not consistent with SBP (<250 PMNs). No longer consumes alcohol or uses drugs.  Admission LFTs near baseline (AST 71, ALT 40, ALP 165) with normal bilirubin.  MELD 8.  Continued Lasix 40 mg, spironolactone 50 mg for diuresis and lactulose, rifaximin for HE.  ??  Chronic Medical Conditions:  COPD: Continued home inhaler regimen.  HTN: Amlodipine 10 mg every day. Stopped HCTZ 25  mg in setting of cirrhosis and normotension.  HLD: Atorvastatin 20 mg   Depression: Escitalopram 5 mg QD  T2DM:??Continue home linagliptin and Metformin at discharge.  Neuropathy: Gabapentin 300 mg TID, Oxycodone 5 mg QID PRN    Procedures:  None  No admission procedures for hospital encounter.  ______________________________________________________________________  Discharge Medications:     Your Medication List      STOP taking these medications    hydroCHLOROthiazide 25 MG tablet  Commonly known as: HYDRODIURIL     lancets Misc        START taking these medications    polyethylene glycol 17 gram packet  Commonly known as: MIRALAX  Take 17 g by mouth daily.  Start taking on: January 25, 2019        CONTINUE taking these medications    albuterol 2.5 mg /3 mL (0.083 %) nebulizer solution  Inhale 2.5 mg every six (6) hours as needed.     amLODIPine 10 MG tablet  Commonly known as: NORVASC  Take 1 tablet (10 mg total) by mouth daily.     ANORO ELLIPTA 62.5-25 mcg/actuation inhaler  Generic drug: umeclidinium-vilanteroL  Inhale 1 puff.     aspirin 81 MG chewable tablet  Chew 1 tablet (81 mg total) daily.     atorvastatin 20 MG tablet  Commonly known as: LIPITOR  Take 1 tablet by mouth Continuous.     blood sugar diagnostic Strp  Check blood sugars six times a day. 250.62 Uncontrolled diabetes, uses insulin. diaper,brief,adult,disposable Misc  Commonly known as: ADULT BRIEF - EXTRA LARGE  Use as directed     diclofenac sodium 1 % gel  Commonly known as: VOLTAREN  Apply 2 g topically daily as needed for arthritis.     escitalopram oxalate 5 MG tablet  Commonly known as: LEXAPRO     furosemide 20 MG tablet  Commonly known as: LASIX  Take 2 tablets (40 mg total) by mouth daily.     gabapentin 300 MG capsule  Commonly known as: NEURONTIN  300 mg Three (3) times a day.     lactulose 10 gram/15 mL solution  Commonly known as: CHRONULAC  Take 45 mL (30 g total) by mouth Two (2) times a day.     LINZESS 290 mcg capsule  Generic drug: linaCLOtide  Take 1 capsule (290 mcg total) by mouth daily.     metFORMIN 500 MG tablet  Commonly known as: GLUCOPHAGE  Take 1 tablet (500 mg total) by mouth 2 (two) times a day with meals.     metoprolol tartrate 100 MG tablet  Commonly known as: LOPRESSOR  Take 0.5 tablets (50 mg total) by mouth Two (2) times a day.     oxyCODONE 5 MG immediate release tablet  Commonly known as: ROXICODONE  Take 1 tablet by mouth 4 (four) times a day as needed.     pen needle, diabetic 32 gauge x 5/32 (4 mm) Ndle  Use to give insulin 6 times a day.     PNEUMOVAX-23 25 mcg/0.5 mL Syrg injection  Generic drug: pneumococcal 23-val ps vaccine  PHARMACIST ADMINISTERED IMMUNIZATION ADMINISTERED AT TIME OF DISPENSING     pregabalin 100 MG capsule  Commonly known as: LYRICA  Take 100 mg by mouth Two (2) times a day.     rifAXIMin 550 mg Tab  Commonly known as: XIFAXAN  Take 1 tablet (550 mg total) by mouth Two (2) times a day.     spironolactone 50 MG tablet  Commonly known as: ALDACTONE  Take 1 tablet (50 mg total) by mouth daily.     TRADJENTA 5 mg Tab  Generic drug: linaGLIPtin  Take 5 mg by mouth daily.            Allergies:  Lisinopril and Codeine  ______________________________________________________________________  Pending Test Results (if blank, then none):      Most Recent Labs: All lab results last 24 hours -   Recent Results (from the past 24 hour(s))   POCT Glucose    Collection Time: 01/23/19  8:05 PM   Result Value Ref Range    Glucose, POC 157 70 - 179 mg/dL   JWJXB-14 PCR    Collection Time: 01/23/19 10:46 PM    Specimen: Nasopharyngeal Swab   Result Value Ref Range    SARS-CoV-2 PCR Not Detected Not Detected   POCT Glucose    Collection Time: 01/24/19  8:33 AM   Result Value Ref Range    Glucose, POC 84 70 - 179 mg/dL   Comprehensive Metabolic Panel    Collection Time: 01/24/19  9:10 AM   Result Value Ref Range    Sodium 137 135 - 145 mmol/L    Potassium 3.4 (L) 3.5 - 5.0 mmol/L    Chloride 104 98 - 107 mmol/L    Anion Gap 5 (L) 7 - 15 mmol/L    CO2 28.0 22.0 - 30.0 mmol/L    BUN 11 7 - 21 mg/dL    Creatinine 7.82 9.56 - 1.00 mg/dL    BUN/Creatinine Ratio 12     EGFR CKD-EPI Non-African American, Female 64 >=60 mL/min/1.65m2    EGFR CKD-EPI African American, Female 8 >=60 mL/min/1.25m2    Glucose 71 70 - 179 mg/dL    Calcium 8.4 (L) 8.5 - 10.2 mg/dL    Albumin 2.5 (L) 3.5 - 5.0 g/dL    Total Protein 5.2 (L) 6.5 - 8.3 g/dL    Total Bilirubin 0.3 0.0 - 1.2 mg/dL    AST 61 (H) 14 - 38 U/L    ALT 35 (H) <35 U/L    Alkaline Phosphatase 152 (H) 38 - 126 U/L   POCT Glucose    Collection Time: 01/24/19  1:01 PM   Result Value Ref Range    Glucose, POC 156 70 - 179 mg/dL       Relevant Studies/Radiology (if blank, then none):  Ecg 12 Lead (adult)    Result Date: 01/23/2019  SINUS BRADYCARDIA OTHERWISE NORMAL ECG WHEN COMPARED WITH ECG OF 06-Oct-2018 09:41, NO SIGNIFICANT CHANGE WAS FOUND Confirmed by Gus Rankin ([id number]) on 01/23/2019 2:52:32 PM    Xr Chest Portable    Result Date: 01/23/2019 EXAM: XR CHEST PORTABLE DATE: 01/23/2019 12:37 PM ACCESSION: [id number] UN DICTATED: 01/23/2019 12:38 PM INTERPRETATION LOCATION: [facility] CLINICAL INDICATION: 63 years old Female with DYSPNEA  COMPARISON: 05/11/2017 TECHNIQUE: Portable Chest Radiograph. CONCLUSIONS: Normal heart size. Lungs are well-inflated without consolidation. No pleural effusion or pneumothorax.    Mri Abdomen W Wo Contrast    Result Date: 01/24/2019 EXAM: MRI abdomen with and without contrast DATE: 01/24/2019 8:05 AM ACCESSION: [id number] UN DICTATED: 01/24/2019 8:41 AM INTERPRETATION LOCATION: [facility] CLINICAL INDICATION: 63 years old Female with Upper abdominal pain, characterization nonspecific liver lesions, characterization of pancreatic lesions, evaluation of hepatic vasculature  COMPARISON: CT abdomen/pelvis 01/23/2019 and MRI abdomen 10/17/2016 TECHNIQUE: MRI of the abdomen was obtained with and without IV contrast. Multisequence, multiplanar images were obtained.    FINDINGS: LINES/DEVICES: None. LOWER CHEST: No focal airspace  disease. Trace left pleural effusion. There is no pericardial effusion. LIVER: The liver is nodular, fibrotic, consistent with cirrhosis. Previous described lesion in segment 8 demonstrates restricted diffusion or abnormal enhancement suggest focal lesion, probably focal fibrosis/parenchymal heterogeneity. No suspicious hepatic lesions identified.. No lesion suspicious for hepatocellular carcinoma. BILIARY DUCTS: There is no intra- or extrahepatic biliary dilatation. GALLBLADDER: The gallbladder is surgically absent. PANCREAS: Mild peripancreatic edema, probably related to portal hypertension and known ascites, less likely acute pancreatitis, given interval stability over multiple examinations. No evidence of pancreatic necrosis. Multiple pancreatic cystic lesions, measuring up to 1.6 cm in the pancreatic body, not substantially changed compared with previous examinations. No nodularity, solid complement or ductal dilatation. SPLEEN: Borderline splenomegaly measuring 12.2 cm, previously 1.9 cm. ADRENAL GLANDS: The adrenal glands are normal. KIDNEYS: The kidneys have normal size with good corticomedullary differentiation, enhancing symmetrically. There is no hydronephrosis or solid renal mass. VASCULATURE: The aorta, celiac axis and superior mesenteric artery are patent.  The IVC, portal vein, splenic vein, superior mesenteric vein and hepatic veins are patent. Small caliber perisplenic and perigastric varices. Accessory right renal artery. Moderate atherosclerotic disease of the abdominal aorta and its branch vessels. LYMPH NODES: Enlarged upper abdominal, retroperitoneal and mesenteric root adenopathy, probably reactive. For example --2.8 x 1.3 cm portacaval lymph node (12:45) --3.2 x 1.6 cm peripancreatic lymph node (12:40) --2.2 x 1.2 cm SMA lymph node (12:58) --2.5 x 1.4 cm gastrohepatic lymph node (12:39) BOWEL/PERITONEUM/RETROPERITONEUM: The visualized bowel is grossly normal without evidence of obstruction or acute inflammation. Increased small volume abdominopelvic ascites SOFT TISSUE/ BONES: No aggressive bone lesion.     Cirrhosis with evidence portal hypertension, as above. No suspicious lesion seen to correspond to area in question on prior CT examination. Abdominal lymphadenopathy, possibly reactive. 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 Attention on follow-up examinations.    Ct Abdomen Pelvis W Iv Contrast Only    Result Date: 01/23/2019 EXAM: CT ABDOMEN PELVIS W CONTRAST DATE: 01/23/2019 2:58 PM ACCESSION: [id number] UN DICTATED: 01/23/2019 3:05 PM INTERPRETATION LOCATION: [facility] CLINICAL INDICATION: abdominal pain ; Abdominal pain, acute, nonlocalized  COMPARISON: CT abdomen pelvis 12/01/2018, 05/11/2017 TECHNIQUE: A spiral CT scan of the abdomen and pelvis was obtained with IV contrast from the lung bases through the pubic symphysis. Images were reconstructed in the axial plane. Coronal and sagittal reformatted images were also provided for further evaluation. FINDINGS: LINES AND TUBES: None. LOWER THORAX: Normal. HEPATOBILIARY: Markedly nodular liver contour. Right posterior hepatic notch. Ill-defined hypoattenuating areas in the right hepatic lobe (e.g. 2:21). This is similar appearance to prior imaging. No focal hepatic lesions. Cholecystectomy No biliary dilatation.  SPLEEN: Unremarkable. PANCREAS: Multiple cystic lesions throughout the pancreas are unchanged from prior imaging. Reference lesions: -Pancreatic body lesion measuring 1.7 cm (2:44), unchanged when using similar technique -Pancreatic tail lesion measuring 1.1 cm (2:43), unchanged ADRENALS: Unremarkable. KIDNEYS/URETERS: Unremarkable. BLADDER: Unremarkable. PELVIC/REPRODUCTIVE ORGANS: Hysterectomy and bilateral oophrectomy. GI TRACT: No dilated or thick walled loops of bowel. Appendectomy. PERITONEUM/RETROPERITONEUM AND MESENTERY: Small-volume ascites. No pneumoperitoneum. LYMPH NODES: No enlarged lymph nodes. VESSELS: The aorta is normal in caliber.  Mild atherosclerotic disease. The portal venous system is patent. The hepatic veins and IVC are unremarkable. BONES AND SOFT TISSUES: Bilateral hip osteoarthrosis. Left iliac wing bone island. No concerning osseous lesions. No findings to definitively explain the patient's acute abdominal pain. Cirrhotic liver morphology. There is an ill-defined hypoattenuating area in the right hepatic lobe which is similar prior imaging and is nonspecific.  As stated previously, recommend short-term follow-up with MRI. 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 Small-volume ascites.     ______________________________________________________________________  Discharge Instructions:           Other Instructions     Discharge instructions      It was a pleasure taking care of you, Mrs. Degraaf! We got a scan of your abdomen that does not show any findings that would explain your pain. We do not believe that you have an infection in your abdomen since you have never had an infection before and your pain is stable since holding antibiotics. It is recommended that you continue you medications and follow up with the liver doctors (hepatology) for further recommendations.  This appointment is already scheduled for 12/29.    You may stop hydrochlorothiazide since your blood pressures have been stable during your admission.    Please return to the ER if you experience worsening of your abdominal pain, fever, increasing shortness of breath, inability to eat or drink, increased swelling in your abdomen, intolerable pain, or any other concerning symptom.               Follow Up instructions and Outpatient Referrals     Discharge instructions            Appointments which have been scheduled for you    Feb 07, 2019 11:30 AM  (Arrive by 11:00 AM)  RETURN TELEPHONE with Fawn Kirk, FNP  [facility] [place]) [address]  [place] [address]  [phone]      Feb 13, 2019  1:45 PM  (Arrive by 1:30 PM)  MRI ABDOMEN W WO CONTRA    -UN with IC MRI RM 2 IMG MRI IMAGING CENTER [facility]) [address]  [place] [address]  [phone]   On appt date:  Bring recent lab work  Bring documentation of any metal object implants  Take meds as usual  Check w/physician if diabetic  You will be asked to change into a gown for your safety    On appt date do not:  Consume anything 2 hrs  Wear metallic items including jewelry (we are not responsible for lost items)    Let us know if pt:  Claustrophobic  Metal object implant  Pregnant  Prescribed a sedative  On dialysis  Allergic to MRI dye/contrast  Kidney Failure    (Title:MRIWCNTRST)          ______________________________________________________________________  Discharge Day Services:  BP 127/65  - Pulse 65  - Temp 37.3 ??C (Oral)  - Resp 16  - Ht 167.6 cm (5' 6)  - Wt 98.3 kg (216 lb 11.2 oz)  - SpO2 97%  - BMI 34.98 kg/m??   Pt seen on the day of discharge and determined appropriate for discharge.    Condition at Discharge: stable    Length of Discharge: I spent greater than 30 mins in the discharge of this patient.

## 2019-01-25 MED ORDER — POLYETHYLENE GLYCOL 3350 17 GRAM ORAL POWDER PACKET
PACK | Freq: Every day | ORAL | 0 refills | 30.00000 days | Status: CP
Start: 2019-01-25 — End: 2019-02-24

## 2019-01-25 NOTE — Unmapped (Signed)
Care Management  Initial Transition Planning Assessment              General  Care Manager assessed the patient by: Telephone interview with patient, Medical record review, Discussion with Clinical Care team.    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 Patient had MRI of her abdomen today. She worked with PT/OT - both felt she was at her baseline with ambulation and ADL's and had no recommendations for post-acute services or HME. Patient is hopeful to find out what's wrong and to feel better.     Orientation Level: Oriented X4  Who provides care at home?: N/A  Reason for referral: Discharge Planning    Contact/Decision Maker  Extended Emergency Contact Information  Primary Emergency Contact: Santiago Glad  Home Phone: [phone]  Relation: Sister    Legal Next of Kin / Guardian / [place] / Advance Directives   Sister Santiago Glad is NOK.    Advance Directive (Medical Treatment)  Does patient have an advance directive covering medical treatment?: Patient does not have advance directive covering medical treatment.    Health Care Decision Maker [HCDM] (Medical & Mental Health Treatment)  Healthcare Decision Maker: HCDM documented in the HCDM/Contact Info section.Phillips Odor- sister)    Proofreader (Mental Health Treatment)  Does patient have an advance directive covering mental health treatment?: Patient does not have advance directive covering mental health treatment.    Patient Information  Lives with: Alone    Type of Residence: Private residence  [address]  [place] [address]    Patient's phone: [phone]    Support Systems/Concerns: Family Members, Friends/Neighbors Patient lives alone in a house with 3ste. She has supportive family - her sister or nephew provide transportation to church and medical appointments as she doesn't drive. Sister will provide transportation at discharge.    Responsibilities/Dependents at home?: No    Home Care services in place prior to admission?: No    Equipment Currently Used at Home: grab bar, tub/shower  Patient used to have a rollator but got rid of it. She does not use any HME - is independent at baseline with ambulation and ADLs.     Currently receiving outpatient dialysis?: No    Financial Information  Patient is disabled and receives disability income and has Medicare and Medicaid insurance.    Need for financial assistance?: No    Social Determinants of Health  Social Determinants of Health were addressed in provider documentation.  Please refer to patient history.    Discharge Needs Assessment  Concerns to be Addressed: no discharge needs identified, denies needs/concerns at this time    Clinical Risk Factors: Multiple Diagnoses (Chronic), Lives Alone or Absence of Caregiver to Assist with Discharge and Home Care    PMH: COPD, cirrhosis, Hepatitis C    PCP: Pamelia Hoit MD    Barriers to taking medications: No  Pharmacy: [facility] in [place]    Prior overnight hospital stay or ED visit in last 90 days: No    Readmission Within the Last 30 Days: no previous admission in last 30 days    Anticipated Changes Related to Illness: none    Equipment Needed After Discharge: none    Discharge Facility/Level of Care Needs: Home with self-care  Readmission  Risk of Unplanned Readmission Score:  %  Predictive Model Details   No score data available for [facility] Risk of Unplanned Readmission     Readmitted Within the Last 30 Days?  No   Patient at risk for readmission?: No    Discharge Plan Screen findings are: Care Manager reviewed the plan of the patient's care with the Multidisciplinary Team. No discharge planning needs identified at this time. Care Manager will continue to manage plan and monitor patient's progress with the team.    Expected Discharge Date: 01/26/2019    Expected Transfer from Critical Care: N/A    Patient and/or family were provided with choice of facilities / services that are available and appropriate to meet post hospital care needs?: N/A    Initial Assessment complete?: Yes    Emilio Math, CCM  Care Manager - pager [phone]

## 2019-01-26 MED ORDER — LANTUS U-100 INSULIN 100 UNIT/ML SUBCUTANEOUS SOLUTION
SUBCUTANEOUS | 0.00000 days
Start: 2019-01-26 — End: 2019-06-26

## 2019-01-30 MED ORDER — LANCETS 28 GAUGE
0.00000 days
Start: 2019-01-30 — End: ?

## 2019-01-30 MED ORDER — TRUE METRIX GLUCOSE TEST STRIP
0.00000 days
Start: 2019-01-30 — End: ?

## 2019-02-06 NOTE — Unmapped (Signed)
Prior authorization sent to cover my meds

## 2019-02-07 ENCOUNTER — Encounter: Admit: 2019-02-07 | Discharge: 2019-02-08 | Payer: MEDICARE | Attending: Family | Primary: Family

## 2019-02-07 MED ORDER — HYDROXYZINE HCL 25 MG TABLET
ORAL_TABLET | Freq: Three times a day (TID) | ORAL | 1 refills | 30 days | Status: CP | PRN
Start: 2019-02-07 — End: ?

## 2019-02-07 MED ORDER — KRISTALOSE 20 GRAM ORAL PACKET
Freq: Three times a day (TID) | ORAL | 6 refills | 30 days | Status: CP
Start: 2019-02-07 — End: ?
  Filled 2019-02-07: qty 90, 30d supply, fill #0

## 2019-02-07 MED FILL — KRISTALOSE 20 GRAM ORAL PACKET: 30 days supply | Qty: 90 | Fill #0 | Status: AC

## 2019-02-07 NOTE — Unmapped (Signed)
[facility]    [name], M.D.  Professor of Medicine  Director, [facility]  [place] [facility] [place] [facility] [place]    [phone]    REFERRING PROVIDER: Fawn Kirk, FNP  [address]  [id number]  [place],  [place] [address]    PRIMARY CARE PROVIDER: Elvera Maria, MD    SUBJECTIVE:     REASON FOR VISIT:  Decompensated cirrhosis (+HE, + ascites, - upper GI bleed) s/p Hepatitis C Treatment with 24 weeks ~CURE    HISTORY OF PRESENT ILLNESS:      Chloe Moore is a 63 y.o. female (DOB: [birth year]) with history of decompensated cirrhosis (+HE, + ascites, - variceal bleed) secondary to chronic Hepatitis C, genotype 1a. Patient has not been seen in clinic since pandemic. Chief complaint for today's visit is continued cirrhosis care. Visit was conducted telephonic as part of COVID-19 action plan. She was hospitalized couple of weeks ago for abdominal pain/distention. Discharged on 01/24/2019. Small amount of ascites noted which was not enough to allow LVP to be conducted. Appears underlying cause may have been constipation versus pancreatic cysts. History of IPMN. Evidence of lymphadenopathy noted MRI of abdomen 01/24/2019. See Liver Section for complete details of report. She had been hospitalized October 06, 2018 and during this admission she received her third LVP. Per patient her PCP has been ordering LVPs prn. LVP result from recent admission:   Cell count with 693 RBCs, 330 nucleated cells with 23% PMNs, Albumin < 1, Protein < 2. Initially she was diagnosed with SBP but after review of results felt to not be the case. Antibiotic treatment was stopped during hospitalization and not discharged home on prophylaxis. History of persistently low albumin level. She does not strive to consume diet high in lean protein or strictly adhere to low sodium diet (breakfast examples: Eggs, pancakes, sausage, bacon, grits, oatmeal. Lunch: Cheese sandwich. Dinner: Meat and potato or spaghetti. Complaint of poor appetite. Previous instructed to drink protein drink one to two times daily.     She is continues to reside with her sister who helps in her medical care. Most of her medications are not placed in blister pack for her. Some of medications continue to be received in bottle such as spironolactone. Taking lactulose 30 ml to 45 ml bid and linzess 290 mcg daily. Denies bowel movement on daily basis. Average pattern every other day. Number of BMs on these days is 3-4 daily. Continues Xifaxan 550 mg bid and does take Miralax prn. Complaint of excessive flatulence with Lactulose and difficulty with sweet taste at times.     Experiencing night sweats for the past two months. She has not been checking her BS when this happens. Per patient BS are good. She is not taking Lantus bid. Only 22 units once daily. + pruritis over the past six months. Denies associated rash or change in hygiene products.     Intermittent occurrences of nausea. Denies vomiting, fevers, chills, jaundice, chest pain, rectal bleeding or melena. History of difficulty sleeping and mood disorder. Started Seroquel 50 mg nightly recently and tolerating medication very well. Sleeping better. + SOB at times. Under care of pulmonary clinic with [facility] Diagnosed with COPD. On Chantix and no tobacco use for past ~ six months. Denies any alcohol use for the past 7 years, but discrepancy noted with last OV being 3.5 years.     Ms. Stowell believes she was diagnosed with Hepatitis C in 1997. At that  time, she was treated with course of ribavirin and peg interferon at [facility], but she does not believe she completed the course due to side effects. She has been successfully treated and cured of Hepatitis C treatment s/p 24 weeks of Harvoni. 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 She also has a history of heavy alcohol use for many years, and she says she drank more than 1 pint of liquor many days of the week. REVIEW OF SYSTEMS:   All systems were reviewed and negative except in HPI.     PAST MEDICAL HISTORY:  Past Medical History:   Diagnosis Date   ??? Acute renal failure (ARF) (CMS-HCC)    ??? Allergic reaction caused by a drug    ??? Arthritis    ??? Asthma    ??? Asthma    ??? Cirrhosis (CMS-HCC)    ??? COPD (chronic obstructive pulmonary disease) (CMS-HCC)    ??? Diabetes mellitus (CMS-HCC)    ??? Hepatitis C    ??? Hypertension    ??? Sinusitis      Liver Care:  -- Chronic hepatitis C, treatment-experienced, genotype 1a ~ successfully treated Harvoni x 24 hours.   -- Fibroscan 03/14/2014:  45 KPa, F4 disease  -- AFP 17.4 (03/14/2014)  -- Immunity to Hepatitis A and Hepatitis B  -- HCC screening:  US Liver doppler 10/07/2018:   Echogenic liver with coarse echotexture compatible with diffuse hepatocellular disease. Patent hepatic vasculature with normal flow direction. Trace ascites.    MRI of abdomen 01/24/2019: + cirrhosis. Mild peripancreatic edema, probably related to portal hypertension and known ascites. No evidence of pancreatic necrosis. Multiple pancreatic cystic lesions, measuring up to 1.6 cm in the pancreatic body. No nodularity, solid complement or ductal dilatation. + splenomegaly. + lymphadenopathy. Moderate atherosclerotic disease of the abdominal aorta and its branch vessels. Small amount of ascites.     -- Variceal Screening: EGD 2016- Normal esophagus.  + portal hypertensive gastropathy.     EGD 12/2018: No esophageal varices.  Portal hypertensive gastropathy. Normal examined duodenum. Surveillance 01/2020.     -- MELD-Na score: 8 at 01/24/2019  9:10 AM  MELD score: 8 at 01/24/2019  9:10 AM  Calculated from:  Serum Creatinine: 0.94 mg/dL (Rounded to 1 mg/dL) at 16/11/9602  5:40 AM  Serum Sodium: 137 mmol/L at 01/24/2019  9:10 AM  Total Bilirubin: 0.3 mg/dL (Rounded to 1 mg/dL) at 98/12/9145  8:29 AM  INR(ratio): 1.22 at 01/23/2019 11:41 AM  Age: 63 years 11 months     Stable score of 8.       -- OLT candidate - Poor candidate based co morbid conditions     PAST SURGICAL HISTORY:  Past Surgical History:   Procedure Laterality Date   ??? APPENDECTOMY     ??? CARPAL TUNNEL RELEASE     ??? CHOLECYSTECTOMY     ??? HYSTERECTOMY     ??? OOPHORECTOMY     ??? PR COLON CA SCRN NOT HI RSK IND N/A 07/18/2014    Procedure: COLOREC CNCR SCR;COLNSCPY NO;  Surgeon: Bronson Curb, MD;  [facility];  Service: Gastroenterology   ??? PR UPPER GI ENDOSCOPY,DIAGNOSIS N/A 07/18/2014    Procedure: UGI ENDO, INCLUDE ESOPHAGUS, STOMACH, & DUODENUM &/OR JEJUNUM; DX W/WO COLLECTION SPECIMN, BY BRUSH OR WASH;  Surgeon: Bronson Curb, MD;  [facility];  Service: Gastroenterology   ??? PR UPPER GI ENDOSCOPY,DIAGNOSIS N/A 12/30/2018    Procedure:  UGI ENDO, INCLUDE ESOPHAGUS, STOMACH, & DUODENUM &/OR JEJUNUM; DX W/WO COLLECTION SPECIMN, BY BRUSH OR WASH;  Surgeon: Jules Husbands, MD;  Location: [facility];  Service: Gastroenterology   ??? REDUCTION MAMMAPLASTY Bilateral      MEDICATIONS:  Current Outpatient Medications   Medication Sig Dispense Refill   ??? albuterol 2.5 mg /3 mL (0.083 %) nebulizer solution Inhale 2.5 mg every six (6) hours as needed.      ??? ALCOHOL PREP PADS PadM USE THREE TIMES DAILY. DIAGNOSIS CODE  E11.65     ??? amLODIPine (NORVASC) 10 MG tablet Take 1 tablet (10 mg total) by mouth daily. 30 tablet 11   ??? aspirin 81 MG chewable tablet Chew 1 tablet (81 mg total) daily. 30 tablet 0   ??? atorvastatin (LIPITOR) 20 MG tablet Take 1 tablet by mouth Continuous.     ??? BD VEO INSULIN SYRINGE UF 1/2 mL 31 gauge x 15/64 (6 mm) Syrg Use as directed.     ??? blood sugar diagnostic Strp Check blood sugars six times a day. 250.62 Uncontrolled diabetes, uses insulin.     ??? CHANTIX 1 mg tablet TAKE 1 TABLET TWICE A DAY WITH GLASS OF WATER BY MOUTH AFTER MEALS     ??? diaper,brief,adult,disposable (ADULT BRIEF - EXTRA LARGE) Misc Use as directed 90 each 6   ??? diclofenac sodium (VOLTAREN) 1 % gel Apply 2 g topically daily as needed for arthritis. 100 g 0   ??? escitalopram oxalate (LEXAPRO) 5 MG tablet      ??? furosemide (LASIX) 20 MG tablet Take 2 tablets (40 mg total) by mouth daily. 60 tablet 6   ??? hydroCHLOROthiazide (HYDRODIURIL) 25 MG tablet TAKE 1 TABLET BY MOUTH EVERY DAY FOR BLOOD PRESSURE     ??? lactulose (CHRONULAC) 10 gram/15 mL solution Take 45 mL (30 g total) by mouth Two (2) times a day. 1200 mL 6   ??? LANTUS U-100 INSULIN 100 unit/mL injection INJECT 22 UNITS UNDER THE SKIN TWICE DAILY     ??? linaCLOtide (LINZESS) 290 mcg capsule Take 1 capsule (290 mcg total) by mouth daily. 30 capsule 6   ??? linagliptin (TRADJENTA) 5 mg Tab Take 5 mg by mouth daily.     ??? metFORMIN (GLUCOPHAGE) 500 MG tablet Take 1 tablet (500 mg total) by mouth 2 (two) times a day with meals.     ??? metoprolol tartrate (LOPRESSOR) 100 MG tablet Take 0.5 tablets (50 mg total) by mouth Two (2) times a day. 30 tablet 0   ??? metoprolol tartrate (LOPRESSOR) 50 MG tablet TAKE ONE TABLET BY MOUTH TWO TIMES A DAY FOR BLOOD PRESSURE     ??? oxyCODONE (ROXICODONE) 5 MG immediate release tablet Take 1 tablet by mouth 4 (four) times a day as needed.     ??? pen needle, diabetic 32 gauge x 5/32 Ndle Use to give insulin 6 times a day.     ??? PNEUMOVAX 23 25 mcg/0.5 mL Syrg injection PHARMACIST ADMINISTERED IMMUNIZATION ADMINISTERED AT TIME OF DISPENSING  0   ??? polyethylene glycol (MIRALAX) 17 gram packet Take 17 g by mouth daily. 30 packet 0   ??? pregabalin (LYRICA) 100 MG capsule Take 100 mg by mouth Two (2) times a day.     ??? QUEtiapine (SEROQUEL) 50 MG tablet Take 50 mg by mouth nightly.     ??? rifAXIMin (XIFAXAN) 550 mg Tab Take 1 tablet (550 mg total) by mouth Two (2) times a day. 60 tablet 6   ???  spironolactone (ALDACTONE) 50 MG tablet Take 1 tablet (50 mg total) by mouth daily. 60 tablet 3   ??? umeclidinium-vilanterol (ANORO ELLIPTA) 62.5-25 mcg/actuation inhaler Inhale 1 puff.      ??? hydrOXYzine (ATARAX) 25 MG tablet Take 1 tablet (25 mg total) by mouth Three (3) times a day as needed. 90 tablet 1   ??? lactulose (KRISTALOSE) 20 gram packet Take 1 packet (20 g total) by mouth 3 (three) times a day. 90 each 6     No current facility-administered medications for this visit.    See scanned MAR.    ALLERGIES:  Lisinopril and Codeine    FAMILY HISTORY:  No family history of liver disease.      SOCIAL HISTORY:  Social History     Tobacco Use   ??? Smoking status: Former Smoker     Quit date: 02/21/2014     Years since quitting: 4.9   ??? Smokeless tobacco: Never Used   Substance Use Topics   ??? 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     ??? 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   Previous IV drug use in the 1980s.    OBJECTIVE:   VITAL SIGNS and PHYSICAL EXAM:  Both VS and PE were not obtained today given nature of today's visit.     Laboratory results:  I personally reviewed all recent laboratory results drawn during recent hospitalization. Concern with persistently low albumin level.     ASSESSMENT AND PLAN:   1.  Decompensated cirrhosis secondary to HCV and alcohol abuse history (+ HE, +ascites, - variceal bleed).    Chloe Moore is a very pleasant 62 yo AA female whose chief complaints today are continued cirrhosis care, HE, ascites and chronic constipation. History of three LVPs during lifetime. Onset of requiring LVPs over the past year. Hospitalized in August and negative evaluation for SBP. She continues to experience chronic constipation.Taking lactulose 30 ml to 45 ml bid and linzess 290 mcg daily. Use Miralax prn.     HE: Prescribed Xifaxan 550 mg bid, but states she has not been able to get RX from her local pharmacy due to prescriber's name change? Will assist patient in following up on this concern. Wish for her to remain on Xifaxan 550 mg bid and add lactulose back with dose of 30 ml qd. Assist in management of HE given her history and constipation. She does admit taste of lactulose is too sweet at times and difficulty in taking medication. Will switch patient to [facility] and see if better result and tolerability. RX sent to [facility]. Again, reviewed with patient the goal of two to three bowel movements daily.     ~ Office follow up three months.     2. Nutritional support: Adhere to strict low sodium diet of less than 2,000 mg total daily and diet high in lean protein. Goal of 30 grams of lean protein with each meal and high protein snack at bedtime.      2.  BLE/Ascites: Continue Lasix 40 mg and Aldactone 50 mg daily. Strict adherence with low sodium diet, less than 2 gm sodium diet.    3. HE -Xifaxan 550 mg bid and Kristalose as noted above. Increase dosing of lactulose type medication back to tid regimen.  4. Diabetes mellitus ~Strict control necessary.   Medication management oral therapy as well as insulin. Concern with night sweats over the past two months. Possible experiencing hypoglycemia. Instructed to check her BS when this occurs and notify her PCP.      5. HCC screening: Up to date. Warrant surveillance every six months.     5.  History of HCV ~ Prior treatment-experienced with PEG/RVN at [facility] in 1997, genotype 1a. Successfully treated and cured s/p Harvoni x 24 weeks.     6.  Alcohol assessment: No alcohol use. AUDIT ~ zero.     7. Varices Screening-Warrant surveillance 01/2020.     8. COPD: Under the care of [facility] pulmonary clinic. Congratulated on smoking cessation.     9. IPMN: Referral placed for biliary clinic (our department). Consultation for input on future management and whether EUS with fine needle biopsy would be beneficial in the setting of lymphadenopathy.     10. Insomnia: Recently prescribed Seroquel 50 mg nightly. Tolerating medication well.     11. Pruritis: RX Atarax 25 mg one tablet tid prn.     All patient's questions were answered to their satisfaction during visit.      I spent 38 minutes on the phone visit with the patient. I spent an additional 21 minutes on pre- and post-visit activities.     The patient was not located and I was not located within 250 yards of a hospital based location during the phone visit. The patient was physically located in [place] or a state in which I am permitted to provide care. The patient and/or parent/guardian understood that s/he may incur co-pays and cost sharing, and agreed to the telemedicine visit. The visit was reasonable and appropriate under the circumstances given the patient's presentation at the time.    The patient and/or parent/guardian has been advised of the potential risks and limitations of this mode of treatment (including, but not limited to, the absence of in-person examination) and has agreed to be treated using telemedicine. The patient's/patient's family's questions regarding telemedicine have been answered.    If the visit was completed in an ambulatory setting, the patient and/or parent/guardian has also been advised to contact their provider???s office for worsening conditions, and seek emergency medical treatment and/or call 911 if the patient deems either necessary.    Sister ~ Santiago Glad [facility] , Cell # [phone]).      Rodman Key, DNP, FNP-BC  [facility]  [address]  [place] [address]  Phone [phone]

## 2019-02-07 NOTE — Unmapped (Signed)
This patient has been disenrolled from the [facility] specialty pharmacy services due to a pharmacy change. The patient is now filling at [facility]. She said she prefers the [facility] system used at [facility].. We are filling her [place] Friday today but next month she said she will have [facility] transfer it over.    Roderic Palau  [facility] Special[facility] Pharmacist

## 2019-02-21 ENCOUNTER — Encounter: Admit: 2019-02-21 | Discharge: 2019-02-22 | Payer: MEDICARE | Attending: Family | Primary: Family

## 2019-02-21 NOTE — Unmapped (Signed)
[facility] ADVANCED ENDOSCOPY / BILIARY CONSULTATION NOTE     TELEHEALTH VISIT     Reason for Consult:  Pancreatic Cystic Lesion     Referring Physician:   Fawn Kirk, FNP  [address]  [id number]  [place],  [place] [address]    Chief Complaint: Bloating, constipation     History of Presenting Illness: 64 y.o. 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 She follows with our [facility] for cirrhosis care. 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     Ms. Farfan presented to the hospital in December 2020 for subacute upper abdominal pain, which she describes as constant dull ache >RUQ for approximately 6 months. Lipase normal. Bilirubin was not suggestive of biliary obstruction. CT AP (12/14) with ill-defined area in right hepatic lobe, numerous cystic lesions in pancreas relatively unchanged, and small volume ascites. 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 There were also several sizable upper abdominal, retroperitoneal and mesenteric root lymph nodes, thought to be reactive. There was low suspicion for SBP since she has never had this complication (last diagnostic para with <250 PMNs) and no other signs of infection. Briefly received antibiotics in the ER, but these were discontinued on admission. Upon further questioning, pain probably goes back >10+ years.     Since hospital admission, Ms. Sleeper report she has been doing well except for some bloating and constipation (chronic). Her constipation has improved some w/ the [employer]. She does continue to have this vague abdominal pain and notes it does get better w/ defecation. She reports weight loss over last year ~15lbs.     She denies a personal or family hx of pancreatitis or pancreatic cancer.    ??    Past Medical History:   Diagnosis Date   ??? Acute renal failure (ARF) (CMS-HCC)    ??? Allergic reaction caused by a drug    ??? Arthritis    ??? Asthma    ??? Asthma    ??? Cirrhosis (CMS-HCC)    ??? COPD (chronic obstructive pulmonary disease) (CMS-HCC)    ??? Diabetes mellitus (CMS-HCC)    ??? Hepatitis C    ??? Hypertension    ??? Sinusitis        Medications:    Current Outpatient Medications:   ???  amLODIPine (NORVASC) 10 MG tablet, Take 1 tablet (10 mg total) by mouth daily., Disp: 30 tablet, Rfl: 11  ???  aspirin 81 MG chewable tablet, Chew 1 tablet (81 mg total) daily., Disp: 30 tablet, Rfl: 0  ???  atorvastatin (LIPITOR) 20 MG tablet, Take 1 tablet by mouth Continuous., Disp: , Rfl:   ???  BD VEO INSULIN SYRINGE UF 1/2 mL 31 gauge x 15/64 (6 mm) Syrg, Use as directed., Disp: , Rfl:   ???  blood sugar diagnostic Strp, Check blood sugars six times a day. 250.62 Uncontrolled diabetes, uses insulin., Disp: , Rfl:   ???  CHANTIX 1 mg tablet, TAKE 1 TABLET TWICE A DAY WITH GLASS OF WATER BY MOUTH AFTER MEALS, Disp: , Rfl:   ???  diaper,brief,adult,disposable (ADULT BRIEF - EXTRA LARGE) Misc, Use as directed, Disp: 90 each, Rfl: 6  ???  diclofenac sodium (VOLTAREN) 1 % gel, Apply 2 g topically daily as needed for arthritis., Disp: 100 g, Rfl: 0  ???  escitalopram oxalate (LEXAPRO) 5 MG tablet, , Disp: , Rfl:   ???  furosemide (LASIX) 20 MG tablet, Take 2 tablets (40 mg total) by mouth daily., Disp: 60 tablet, Rfl: 6  ???  hydroCHLOROthiazide (HYDRODIURIL) 25 MG tablet, TAKE 1 TABLET BY MOUTH EVERY DAY FOR BLOOD PRESSURE, Disp: , Rfl:   ???  hydrOXYzine (ATARAX) 25 MG tablet, Take 1 tablet (25 mg total) by mouth Three (3) times a day as needed., Disp: 90 tablet, Rfl: 1  ???  lactulose (KRISTALOSE) 20 gram packet, Take 1 packet (20 g total) by mouth 3 (three) times a day., Disp: 90 each, Rfl: 6  ???  LANTUS U-100 INSULIN 100 unit/mL injection, INJECT 22 UNITS UNDER THE SKIN TWICE DAILY, Disp: , Rfl:   ???  linaCLOtide (LINZESS) 290 mcg capsule, Take 1 capsule (290 mcg total) by mouth daily., Disp: 30 capsule, Rfl: 6  ???  linagliptin (TRADJENTA) 5 mg Tab, Take 5 mg by mouth daily., Disp: , Rfl:   ???  metFORMIN (GLUCOPHAGE) 500 MG tablet, Take 1 tablet (500 mg total) by mouth 2 (two) times a day with meals., Disp: , Rfl:   ???  metoprolol tartrate (LOPRESSOR) 100 MG tablet, Take 0.5 tablets (50 mg total) by mouth Two (2) times a day., Disp: 30 tablet, Rfl: 0  ???  metoprolol tartrate (LOPRESSOR) 50 MG tablet, TAKE ONE TABLET BY MOUTH TWO TIMES A DAY FOR BLOOD PRESSURE, Disp: , Rfl:   ???  oxyCODONE (ROXICODONE) 5 MG immediate release tablet, Take 1 tablet by mouth 4 (four) times a day as needed., Disp: , Rfl:   ???  pen needle, diabetic 32 gauge x 5/32 Ndle, Use to give insulin 6 times a day., Disp: , Rfl:   ???  PNEUMOVAX 23 25 mcg/0.5 mL Syrg injection, PHARMACIST ADMINISTERED IMMUNIZATION ADMINISTERED AT TIME OF DISPENSING, Disp: , Rfl: 0  ???  pregabalin (LYRICA) 100 MG capsule, Take 100 mg by mouth Two (2) times a day., Disp: , Rfl:   ???  QUEtiapine (SEROQUEL) 50 MG tablet, Take 50 mg by mouth nightly., Disp: , Rfl:   ???  rifAXIMin (XIFAXAN) 550 mg Tab, Take 1 tablet (550 mg total) by mouth Two (2) times a day., Disp: 60 tablet, Rfl: 6  ???  spironolactone (ALDACTONE) 50 MG tablet, Take 1 tablet (50 mg total) by mouth daily., Disp: 60 tablet, Rfl: 3  ???  umeclidinium-vilanterol (ANORO ELLIPTA) 62.5-25 mcg/actuation inhaler, Inhale 1 puff. , Disp: , Rfl:   ???  albuterol 2.5 mg /3 mL (0.083 %) nebulizer solution, Inhale 2.5 mg every six (6) hours as needed. , Disp: , Rfl:   ???  ALCOHOL PREP PADS PadM, USE THREE TIMES DAILY. DIAGNOSIS CODE  E11.65, Disp: , Rfl:   ???  lactulose (CHRONULAC) 10 gram/15 mL solution, Take 45 mL (30 g total) by mouth Two (2) times a day. (Patient not taking: Reported on 02/21/2019), Disp: 1200 mL, Rfl: 6  ???  polyethylene glycol (MIRALAX) 17 gram packet, Take 17 g by mouth daily. (Patient not taking: Reported on 02/21/2019), Disp: 30 packet, Rfl: 0    Family History:  Family History   Problem Relation Age of Onset   ??? Breast cancer Sister 39   ??? Diabetes Mother    ??? Cataracts Mother    ??? No Known Problems Father    ??? No Known Problems  Daughter    ??? No Known Problems Maternal Grandmother    ??? No Known Problems Maternal Grandfather    ??? No Known Problems Paternal Grandmother    ??? No Known Problems Paternal Grandfather    ??? BRCA 1/2 Neg Hx    ??? Cancer Neg Hx    ??? Colon cancer Neg Hx    ??? Endometrial cancer Neg Hx    ??? Ovarian cancer Neg Hx        Social History:  Social History     Tobacco Use   ??? Smoking status: Former Smoker     Quit date: 02/21/2014     Years since quitting: 5.0   ??? Smokeless tobacco: Never Used   Substance Use Topics   ??? 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         Review of Systems:  Gen: no fever, chills, or weight loss  HEENT: no epistaxis, no oral ulcers  Eyes: no jaundice  CV: no chest pain, no orthopnea  PULM: no shortness of breath, no cough  ABD: as per the HPI  MSK: no arthralgias, no myalgias  Skin: no rash, no pruritis  Neuro: no headache, no confusion  Psych: no depression  Allergies:   Allergies   Allergen Reactions   ??? Lisinopril Swelling     Found out about 2 months ago, had her tongue swollen and hanging out of her mouth per patient report   ??? Codeine Itching       Physical Exam:    PHYSICAL EXAMINATION was otherwise not performed during this virtual visit.    Wt Readings from Last 6 Encounters:   01/23/19 98.3 kg (216 lb 11.2 oz)   12/30/18 96.2 kg (212 lb)   11/07/18 93.9 kg (207 lb)   10/06/18 96.6 kg (213 lb)   09/27/17 (!) 107.5 kg (237 lb 1.6 oz)   07/28/17 (!) 113.9 kg (251 lb)      Labs:  Lab Results   Component Value Date    ALKPHOS 152 (H) 01/24/2019    BILITOT 0.3 01/24/2019    BILIDIR 0.20 01/23/2019    PROT 5.2 (L) 01/24/2019    ALBUMIN 2.5 (L) 01/24/2019    ALT 35 (H) 01/24/2019    AST 61 (H) 01/24/2019     Lab Results   Component Value Date    WBC 7.5 01/23/2019    HGB 11.8 (L) 01/23/2019    HCT 37.2 01/23/2019    PLT 195 01/23/2019     Lab Results   Component Value Date    PT 14.4 (H) 01/23/2019    PT 13.8 (H) 11/07/2018    PT 14.2 (H) 10/07/2018    INR 1.22 01/23/2019    INR 1.20 11/07/2018    INR 1.23 10/07/2018       Imaging:  Reviewed in detail in EPIC.  Pertinent imaging details summarized below.      01/24/19 MRI Abdomen  PANCREAS: Mild peripancreatic edema, probably related to portal hypertension and known ascites, less likely acute pancreatitis, given interval stability over multiple examinations. No evidence of pancreatic necrosis. Multiple pancreatic cystic lesions, measuring up to 1.6 cm in the pancreatic body, not substantially changed compared with previous examinations. No nodularity, solid complement or ductal dilatation.  ??Impression: Cirrhosis with evidence portal hypertension, as above. No suspicious lesion seen to correspond to area  in question on prior CT examination.    01/23/19 CT Abd Pelvis w/ IV Contrast   IMPRESSION:  No findings to definitively explain the patient's acute abdominal pain.  ??  Cirrhotic liver morphology. There is an ill-defined hypoattenuating area in the right hepatic lobe which is similar prior imaging and is nonspecific. As stated previously, recommend short-term follow-up with MRI.  ??  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  ??  Small-volume ascites.  12/01/18 CT Abdomen Pelvis w/ Contrast   IMPRESSION:  Discrete ill-defined areas of slight hypoattenuation in the right hepatic lobe is indeterminate but could be secondary to confluent fibrosis. Further evaluation or short term follow-up with MRI is recommended. ??  Interval mild increase in size of multiple cystic lesions throughout the pancreas compared to 10/17/2016 MRI. These lesions could also be better evaluated with MRI. Moderate peripancreatic stranding and small-volume peripancreatic fluid, likely secondary to portal hypertension.  ??  Interval resolution of ascites and decrease in mesenteric edema.  ??  Prominent stool burden throughout the entirety of the colon.    05/11/17 CT Abdomen Pelvis w/ Contrast   IMPRESSION:  - No traumatic intra-abdominal abnormalities are identified.  ??  - Findings compatible with cirrhosis and portal hypertension with mesenteric edema, lymphadenopathy, and small volume ascites.  ??  -Fat stranding and fluid surrounding the pancreas and duodenum in the upper abdomen are favored to represent sequela of cirrhosis and portal hypertension, however in the setting of corroborating signs and symptoms, pancreatitis may be considered.  ??  ATTENDING ADDENDUM:  --Infiltrative changes in the anterior abdominal wall are favored to represent edema in the setting of portal hypertension/fluid overload, less likely represent seatbelt sign. Correlate with physical examination.    10/17/16 MRI Abdomen    IMPRESSION:  ??  --Hepatic cirrhosis with steatosis. No evidence of hepatocellular carcinoma.  ??  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    04/17/16 MRI Abdomen   IMPRESSION:  ??  --Hepatic cirrhosis with steatosis. No evidence of hepatocellular carcinoma.  --Stable appearance of pancreatic cystic lesions, can reflect side branches IPMNs. Recommend follow-up in one year if clinically indicated.  ??  08/16/15 MRI Abdomen   IMPRESSION:  - Cirrhosis with portal venous hypertension. No evidence of HCC.  - Stable appearance of cystic lesions within the pancreas. Some of the m may represent sidebranch IPMNs. Attention on follow-up.     02/15/15 MRI Abdomen   IMPRESSION:  ??  1. Cirrhosis without evidence of HCC.  2. 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          Assessment and Plan:  64 y.o. 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 She follows with our [facility] for cirrhosis care. 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     Most recent MRI (01/24/19) reveals mild peripancreatic edema, probably related to portal hypertension and known ascites, less likely acute pancreatitis, given interval stability over multiple examinations. No evidence of pancreatic necrosis. Multiple pancreatic cystic lesions, measuring up to 1.6 cm in the pancreatic body, not substantially changed compared with previous examinations. No nodularity,  solid complement or ductal dilatation. Several enlarged upper abdominal, retroperitoneal and mesenteric root lymph nodes - for example 3.2 x 1.6cm peripancreatic lymph node and 2.8 x 1.3 cm portacaval node, 2.5 x 1.4cm gastrohepatic node. I do not think her IPMNs would be the source of this adenopathy. They have been present since at least 2016 (which is the earliest abdominal imaging study for review). They have grown since 2016, but not at an alarming rate. Would still monitor yearly w/ MRI w/ no plans for EUS with biopsy unless they get larger than 2cm (per ASGE guidelines). I do find the degree of adenopathy concerning, as it appears to be enlarging over past several months, with no clear cause (was briefly hospitalized in August for abd pain but no etiology found - studies not consistent with SBP). Recommend proceeding with EUS FNP of lymphadenopathy. INR and platelet function were ok at December visit. Plan:   - EUS guided biopsy of lymphadenopathy   - Can continue to survey IPMN with yearly MRI, which will probably already be performed in accordance with her HCC screening and continued cirrhois care. Will defer to Liver colleagues to order this since she will be following up with them routinely. Consider EUS bx of pancreatic cysts if they become >2cm or develop concerning features (pancreatic duct dilatation or upstream arophy, pancreatitis)  - RTC PRN based on above   - Case reviewed and discussed with attending Dr. Edyth Gunnels     I spent 20 minutes on the phone visit with the patient. I spent an additional 20 minutes on pre- and post-visit activities.     The patient was not located and I was located within 250 yards of a hospital based location during the phone visit. The patient was physically located in [place] or a state in which I am permitted to provide care. The patient and/or parent/guardian understood that s/he may incur co-pays and cost sharing, and agreed to the telemedicine visit. The visit was reasonable and appropriate under the circumstances given the patient's presentation at the time.    The patient and/or parent/guardian has been advised of the potential risks and limitations of this mode of treatment (including, but not limited to, the absence of in-person examination) and has agreed to be treated using telemedicine. The patient's/patient's family's questions regarding telemedicine have been answered.    If the visit was completed in an ambulatory setting, the patient and/or parent/guardian has also been advised to contact their provider???s office for worsening conditions, and seek emergency medical treatment and/or call 911 if the patient deems either necessary.      Oswaldo Conroy, FNP-C  Division of Gastroenterology  Advanced Therapeutic Endoscopy/Biliary Team

## 2019-02-22 NOTE — Unmapped (Signed)
Update:     Ms. Fournier called the [facility] asking to be re-enrolled to receive her Xifaxan and Kristalose.     Corliss Skains. [place], [place].D.  Specialty Pharmacist  [facility]  [phone] option [phone]

## 2019-03-10 NOTE — Unmapped (Signed)
[facility] Clinical Assessment & Refill Coordination Note    Chloe Moore, DOB: [birth year]  Phone: [phone] (home)     All above HIPAA information was verified with patient.     Was a Nurse, learning disability used for this call? No    Specialty Medication(s):   Infectious Disease: Xifaxan     Current Outpatient Medications   Medication Sig Dispense Refill   ??? albuterol 2.5 mg /3 mL (0.083 %) nebulizer solution Inhale 2.5 mg every six (6) hours as needed.      ??? ALCOHOL PREP PADS PadM USE THREE TIMES DAILY. DIAGNOSIS CODE  E11.65     ??? amLODIPine (NORVASC) 10 MG tablet Take 1 tablet (10 mg total) by mouth daily. 30 tablet 11   ??? aspirin 81 MG chewable tablet Chew 1 tablet (81 mg total) daily. 30 tablet 0   ??? atorvastatin (LIPITOR) 20 MG tablet Take 1 tablet by mouth Continuous.     ??? BD VEO INSULIN SYRINGE UF 1/2 mL 31 gauge x 15/64 (6 mm) Syrg Use as directed.     ??? blood sugar diagnostic Strp Check blood sugars six times a day. 250.62 Uncontrolled diabetes, uses insulin.     ??? CHANTIX 1 mg tablet TAKE 1 TABLET TWICE A DAY WITH GLASS OF WATER BY MOUTH AFTER MEALS     ??? diaper,brief,adult,disposable (ADULT BRIEF - EXTRA LARGE) Misc Use as directed 90 each 6   ??? diclofenac sodium (VOLTAREN) 1 % gel Apply 2 g topically daily as needed for arthritis. 100 g 0   ??? escitalopram oxalate (LEXAPRO) 5 MG tablet      ??? furosemide (LASIX) 20 MG tablet Take 2 tablets (40 mg total) by mouth daily. 60 tablet 6   ??? hydroCHLOROthiazide (HYDRODIURIL) 25 MG tablet TAKE 1 TABLET BY MOUTH EVERY DAY FOR BLOOD PRESSURE     ??? hydrOXYzine (ATARAX) 25 MG tablet Take 1 tablet (25 mg total) by mouth Three (3) times a day as needed. 90 tablet 1   ??? lactulose (CHRONULAC) 10 gram/15 mL solution Take 45 mL (30 g total) by mouth Two (2) times a day. (Patient not taking: Reported on 02/21/2019) 1200 mL 6   ??? lactulose (KRISTALOSE) 20 gram packet Take 1 packet (20 g total) by mouth 3 (three) times a day. 90 each 6   ??? LANTUS U-100 INSULIN 100 unit/mL injection INJECT 22 UNITS UNDER THE SKIN TWICE DAILY     ??? linaCLOtide (LINZESS) 290 mcg capsule Take 1 capsule (290 mcg total) by mouth daily. 30 capsule 6   ??? linagliptin (TRADJENTA) 5 mg Tab Take 5 mg by mouth daily.     ??? metFORMIN (GLUCOPHAGE) 500 MG tablet Take 1 tablet (500 mg total) by mouth 2 (two) times a day with meals.     ??? metoprolol tartrate (LOPRESSOR) 100 MG tablet Take 0.5 tablets (50 mg total) by mouth Two (2) times a day. 30 tablet 0   ??? metoprolol tartrate (LOPRESSOR) 50 MG tablet TAKE ONE TABLET BY MOUTH TWO TIMES A DAY FOR BLOOD PRESSURE     ??? oxyCODONE (ROXICODONE) 5 MG immediate release tablet Take 1 tablet by mouth 4 (four) times a day as needed.     ??? pen needle, diabetic 32 gauge x 5/32 Ndle Use to give insulin 6 times a day.     ??? PNEUMOVAX 23 25 mcg/0.5 mL Syrg injection PHARMACIST ADMINISTERED IMMUNIZATION ADMINISTERED AT TIME OF DISPENSING  0   ??? pregabalin (LYRICA) 100  MG capsule Take 100 mg by mouth Two (2) times a day.     ??? QUEtiapine (SEROQUEL) 50 MG tablet Take 50 mg by mouth nightly.     ??? rifAXIMin (XIFAXAN) 550 mg Tab Take 1 tablet (550 mg total) by mouth Two (2) times a day. 60 tablet 6   ??? spironolactone (ALDACTONE) 50 MG tablet Take 1 tablet (50 mg total) by mouth daily. 60 tablet 3   ??? umeclidinium-vilanterol (ANORO ELLIPTA) 62.5-25 mcg/actuation inhaler Inhale 1 puff.        No current facility-administered medications for this visit.         Changes to medications: Tashya reports no changes at this time.    Allergies   Allergen Reactions   ??? Lisinopril Swelling     Found out about 2 months ago, had her tongue swollen and hanging out of her mouth per patient report   ??? Codeine Itching       Changes to allergies: No    SPECIALTY MEDICATION ADHERENCE     Xifaxan 550 mg: 27 days of medicine on hand (recently filled at [facility])    Medication Adherence    Patient reported X missed doses in the last month: 0  Specialty Medication: Xifaxan 550 mg Patient is on additional specialty medications: No  Informant: patient          Specialty medication(s) dose(s) confirmed: Regimen is correct and unchanged.     Are there any concerns with adherence? No    Adherence counseling provided? Not needed    CLINICAL MANAGEMENT AND INTERVENTION      Clinical Benefit Assessment:    Do you feel the medicine is effective or helping your condition? Yes    Clinical Benefit counseling provided? Not needed    Adverse Effects Assessment:    Are you experiencing any side effects? No    Are you experiencing difficulty administering your medicine? No    Quality of Life Assessment:    How many days over the past month did your cirrhosis  keep you from your normal activities? For example, brushing your teeth or getting up in the morning. 0    Have you discussed this with your provider? Not needed    Therapy Appropriateness:    Is therapy appropriate? Yes, therapy is appropriate and should be continued    DISEASE/MEDICATION-SPECIFIC INFORMATION      N/A    PATIENT SPECIFIC NEEDS     ? Does the patient have any physical, cognitive, or cultural barriers? No    ? Is the patient high risk? No     ? Does the patient require a Care Management Plan? No     ? Does the patient require physician intervention or other additional services (i.e. nutrition, smoking cessation, social work)? No      SHIPPING     Specialty Medication(s) to be Shipped:   Infectious Disease: Xifaxan    Other medication(s) to be shipped: Kristalose     Changes to insurance: No    Delivery Scheduled: Yes, Expected medication delivery date: Xifaxan: 03/28/2019 AND Kristalose: 03/14/2019.     Medication will be delivered via Next Day Courier for Xifaxan and UPS for Kristalose to the confirmed prescription address in [place].    The patient will receive a drug information handout for each medication shipped and additional FDA Medication Guides as required.  Verified that patient has previously received a Conservation officer, historic buildings. All of the patient's questions and concerns have been addressed.  Roderic Palau   [facility] Specialty Pharmacist

## 2019-03-11 ENCOUNTER — Ambulatory Visit: Admit: 2019-03-11 | Discharge: 2019-03-12 | Payer: MEDICARE

## 2019-03-11 NOTE — Unmapped (Signed)
COVID Pre-Procedure Intake Form     Assessment     Chloe Moore is a 65 y.o. female presenting to [facility] for COVID testing.     Plan     If no testing performed, pt counseled on routine care for respiratory illness.  If testing performed, COVID sent.  Patient directed to Home given findings during today's visit.    Subjective     Chloe Moore is a 64 y.o. female who presents to the [facility] with complaints of the following:    Exposure History: In the last 21 days?     Have you traveled outside of [place]? No               Have you been in close contact with someone confirmed by a test to have COVID? (Close contact is within 6 feet for at least 10 minutes) No       Have you worked in a health care facility? No     Lived or worked facility like a nursing home, group home, or assisted living?    No         Are you scheduled to have surgery or a procedure in the next 3 days? Yes               Are you scheduled to receive cancer chemotherapy within the next 7 days?    No     Have you ever been tested before for COVID-19 with a swab of your nose? Yes: When: N/A, Where: [place]   Are you a healthcare worker being tested so to return to work No     Right now,  do you have any of the following that developed over the past 7 days (as stated by patient on intake form):    Subjective fever (felt feverish) No   Chills (especially repeated shaking chills) No   Severe fatigue (felt very tired) Yes, how many days? V   Muscle aches No   Runny nose No   Sore throat No   Loss of taste or smell No   Cough (new onset or worsening of chronic cough) Yes, how many days? N/A   Shortness of breath No   Nausea or vomiting No   Headache No   Abdominal Pain No   Diarrhea (3 or more loose stools in last 24 hours) No       Scribe's Attestation: Denver Faster, FNP, obtained and performed the history, physical exam and medical decision making elements that were entered into the chart.  Signed by Gaspar Skeeters serving as Scribe, on 03/11/2019 12:04 PM

## 2019-03-13 DIAGNOSIS — K7290 Hepatic failure, unspecified without coma: Principal | ICD-10-CM

## 2019-03-13 NOTE — Unmapped (Addendum)
Chloe Moore 's Xifaxan shipment will be canceled  as a result of the medication is too soon to refill until 2/12. Patient had medication filled at another pharmacy 1/20.    I have reached out to the patient and communicated the delivery change. We will not reschedule the medication due to patient recently picking up a fill and have removed this/these medication(s) from the work request.  We have canceled this work request.      UPDATE: Xifaxan was previously scheduled for 2/16 delivery. Spoke to patient to clear up any confusion.

## 2019-03-14 ENCOUNTER — Encounter: Admit: 2019-03-14 | Discharge: 2019-03-17 | Payer: MEDICARE

## 2019-03-14 DIAGNOSIS — R590 Localized enlarged lymph nodes: Principal | ICD-10-CM

## 2019-03-15 MED FILL — KRISTALOSE 20 GRAM ORAL PACKET: ORAL | 30 days supply | Qty: 90 | Fill #1

## 2019-03-15 MED FILL — KRISTALOSE 20 GRAM ORAL PACKET: 30 days supply | Qty: 90 | Fill #1 | Status: AC

## 2019-03-17 ENCOUNTER — Encounter
Admit: 2019-03-17 | Discharge: 2019-03-17 | Payer: MEDICARE | Attending: Nurse Practitioner | Primary: Nurse Practitioner

## 2019-03-17 ENCOUNTER — Encounter: Admit: 2019-03-17 | Discharge: 2019-03-17 | Payer: MEDICARE

## 2019-03-22 MED ORDER — NARCAN 4 MG/ACTUATION NASAL SPRAY
0.00000 days
Start: 2019-03-22 — End: ?

## 2019-03-27 MED FILL — XIFAXAN 550 MG TABLET: 30 days supply | Qty: 60 | Fill #0 | Status: AC

## 2019-03-27 MED FILL — XIFAXAN 550 MG TABLET: ORAL | 30 days supply | Qty: 60 | Fill #0

## 2019-03-28 ENCOUNTER — Encounter: Admit: 2019-03-28 | Discharge: 2019-03-29 | Payer: MEDICARE | Attending: Family | Primary: Family

## 2019-03-28 NOTE — Unmapped (Signed)
[facility]    Chloe [facility], M.D.  Professor of Medicine  Director, [facility]  [place] [facility] [place] [facility] [place]    [phone]    PRIMARY CARE PROVIDER: Elvera Maria, MD    SUBJECTIVE:     REASON FOR VISIT:  Decompensated cirrhosis (+HE, + ascites, - upper GI bleed) s/p Hepatitis C treatment with 24 weeks ~CURE    HISTORY OF PRESENT ILLNESS:      Chloe Moore is a 64 y.o. 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 Patient has not been seen in clinic since pandemic. Chief complaint for today's visit is continued cirrhosis and HE care. Visit was conducted telephonic as part of COVID-19 action plan. During COVID-19 she was hospitalized 01/24/2019. Small amount of ascites noted which was not enough to allow LVP to be conducted. Appears underlying cause may have been constipation versus pancreatic cysts. History of IPMN. Evidence of lymphadenopathy noted MRI of abdomen 01/24/2019. See Liver Section for complete details of report. She was hospitalized October 06, 2018 and during this admission she received her third LVP. Per patient her PCP has been ordering LVPs prn. LVP result from recent admission:   Cell count with 693 RBCs, 330 nucleated cells with 23% PMNs, Albumin < 1, Protein < 2. Initially she was diagnosed with SBP but after review of results felt to not be the case. Antibiotic treatment was stopped during hospitalization and not discharged home on prophylaxis. History of persistently low albumin level. She does not strive to consume diet high in lean protein or strictly adhere to low sodium diet (breakfast examples: Eggs, pancakes, sausage, bacon, grits, oatmeal. Lunch: Cheese sandwich. Dinner: Meat and potato or spaghetti. Complaint of poor appetite. Previous instructed to drink protein drink one to two times daily.     Interim History:  She remains under the care of Chloe Conroy, NP and Dr. Corliss Moore. EUS was performed on 03/17/2019 allow for fine needle biopsy to be obtained based on lymphadenopathy of which noted enlarged lymph nodes having progressively gotten larger. Benign path:  Lymph node, peripancreatic, fine-needle biopsy  - Limited sample consisting predominantly of blood with scant detached fragments of gastrointestinal epithelium and detached lymphoid tissue  - Separate small tissue fragments with vessels, lymphocytes, and histiocytes   - No malignancy identified in this limited sampling  ??  Today she expresses concern with experiencing left sided abdominal pain radiating down to pelvic reason. Worse with movement and standing for longer period of time. Denies being worse or exacerbated with recent endoscopy evaluation. 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 Pending follow up with PCP. Denies associated GI symptoms such as nausea, vomiting, fevers, chills, jaundice, or pruritis. History of chronic constipation as well as HE. She's taking Kristalose bid as opposed to tid as prescribed. BM pattern two times daily average. Remains on Xifaxan 550 mg bid. Occasionally three times daily. Denies rectal bleeding or melena. Initially  during interviewing process she expressed concern with anxiety being worse, forgetful in remembering what to say and losing her thought process very easily. History of BPD. Under care of psychiatrist. Current regimen escitalopram 5 mg every day, hydroxyzine 25 mg tid and Seroquel 50 mg nightly. She feels BPD is well controlled. Unable to state Type I or Type II.     She is continues to reside with her sister who helps in her medical care.  Most of her medications are placed in blister pack for her. Some of medications continue to be received in bottle such as spironolactone. DM remains poorly controlled. Average blood sugars 200's. Diet is higher in carbohydrates. Lantus 22 units bid. Under care of pulmonary clinic with Chloe Moore. Diagnosed with COPD. On Chantix. Smoking once in blue moon. Denies any alcohol use for the past 7 years, but discrepancy noted with last OV being 3.5 years.     [name] believes she was diagnosed with Hepatitis C in 1997. At that time, she was treated with course of ribavirin and peg interferon at [facility], but she does not believe she completed the course due to side effects. She has been successfully treated and cured of Hepatitis C treatment s/p 24 weeks of Harvoni. 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 She also has a history of heavy alcohol use for many years, and she says she drank more than 1 pint of liquor many days of the week.     REVIEW OF SYSTEMS:   All systems were reviewed and negative except in HPI.     PAST MEDICAL HISTORY:  Past Medical History:   Diagnosis Date   ??? Acute renal failure (ARF) (CMS-HCC)    ??? Allergic reaction caused by a drug    ??? Arthritis    ??? Asthma    ??? Asthma    ??? Cirrhosis (CMS-HCC)    ??? COPD (chronic obstructive pulmonary disease) (CMS-HCC)    ??? Diabetes mellitus (CMS-HCC)    ??? Hepatitis C    ??? Hypertension    ??? Sinusitis    Bipolar disorder, ? Type     Liver Care:  -- Chronic hepatitis C, treatment-experienced, genotype 1a ~ successfully treated Harvoni x 24 hours.   -- Fibroscan 03/14/2014:  45 KPa, F4 disease  -- AFP 17.4 (03/14/2014)  -- Immunity to Hepatitis A and Hepatitis B  -- HCC screening:  US Liver doppler 10/07/2018:   Echogenic liver with coarse echotexture compatible with diffuse hepatocellular disease. Patent hepatic vasculature with normal flow direction. Trace ascites.    MRI of abdomen 01/24/2019: + cirrhosis. Mild peripancreatic edema, probably related to portal hypertension and known ascites. No evidence of pancreatic necrosis. Multiple pancreatic cystic lesions, measuring up to 1.6 cm in the pancreatic body. No nodularity, solid complement or ductal dilatation. + splenomegaly. + lymphadenopathy. Moderate atherosclerotic disease of the abdominal aorta and its branch vessels. Small amount of ascites.     -- Variceal Screening: EGD 2016- Normal esophagus.  + portal hypertensive gastropathy.     EGD 12/2018: No esophageal varices.  Portal hypertensive gastropathy. Normal examined duodenum. Surveillance 01/2020.     EUS - 03/2019. Path result as documented above.     -- MELD-Na score: 8 at 01/24/2019  9:10 AM  MELD score: 8 at 01/24/2019  9:10 AM  Calculated from:  Serum Creatinine: 0.94 mg/dL (Rounded to 1 mg/dL) at 11/91/4782  9:56 AM  Serum Sodium: 137 mmol/L at 01/24/2019  9:10 AM  Total Bilirubin: 0.3 mg/dL (Rounded to 1 mg/dL) at 21/30/8657  8:46 AM  INR(ratio): 1.22 at 01/23/2019 11:41 AM  Age: 89 years 11 months     Stable score of 8.       -- OLT candidate - Poor candidate based co morbid conditions     PAST SURGICAL HISTORY:  Past Surgical History:   Procedure Laterality Date   ??? APPENDECTOMY     ??? CARPAL TUNNEL RELEASE     ???  CHOLECYSTECTOMY     ??? HYSTERECTOMY     ??? OOPHORECTOMY     ??? PR COLON CA SCRN NOT HI RSK IND N/A 07/18/2014    Procedure: COLOREC CNCR SCR;COLNSCPY NO;  Surgeon: Bronson Curb, MD;  [facility];  Service: Gastroenterology   ??? PR UPGI ENDOSCOPY W/US FN BX N/A 03/17/2019    Procedure: UGI W/ TRANSENDOSCOPIC ULTRASOUND GUIDED INTRAMURAL/TRANSMURAL FINE NEEDLE ASPIRATION/BIOPSY(S), ESOPHAGUS;  Surgeon: Vonda Antigua, MD;  Location[facility];  Service: Gastroenterology   ??? PR UPPER GI ENDOSCOPY,DIAGNOSIS N/A 07/18/2014    Procedure: UGI ENDO, INCLUDE ESOPHAGUS, STOMACH, & DUODENUM &/OR JEJUNUM; DX W/WO COLLECTION SPECIMN, BY BRUSH OR WASH;  Surgeon: Bronson Curb, MD;  Location: [facility];  Service: Gastroenterology   ??? PR UPPER GI ENDOSCOPY,DIAGNOSIS N/A 12/30/2018    Procedure: UGI ENDO, INCLUDE ESOPHAGUS, STOMACH, & DUODENUM &/OR JEJUNUM; DX W/WO COLLECTION SPECIMN, BY BRUSH OR WASH;  Surgeon: Jules Husbands, MD;  [facility];  Service: Gastroenterology   ??? REDUCTION MAMMAPLASTY Bilateral      MEDICATIONS:  Current Outpatient Medications   Medication Sig Dispense Refill   ??? albuterol 2.5 mg /3 mL (0.083 %) nebulizer solution Inhale 2.5 mg every six (6) hours as needed.      ??? ALCOHOL PREP PADS PadM USE THREE TIMES DAILY. DIAGNOSIS CODE  E11.65     ??? amLODIPine (NORVASC) 10 MG tablet Take 1 tablet (10 mg total) by mouth daily. 30 tablet 11   ??? aspirin 81 MG chewable tablet Chew 1 tablet (81 mg total) daily. 30 tablet 0   ??? atorvastatin (LIPITOR) 20 MG tablet Take 1 tablet by mouth Continuous.     ??? BD VEO INSULIN SYRINGE UF 1/2 mL 31 gauge x 15/64 (6 mm) Syrg Use as directed.     ??? blood sugar diagnostic Strp Check blood sugars six times a day. 250.62 Uncontrolled diabetes, uses insulin.     ??? CHANTIX 1 mg tablet TAKE 1 TABLET TWICE A DAY WITH GLASS OF WATER BY MOUTH AFTER MEALS     ??? diaper,brief,adult,disposable (ADULT BRIEF - EXTRA LARGE) Misc Use as directed 90 each 6   ??? diclofenac sodium (VOLTAREN) 1 % gel Apply 2 g topically daily as needed for arthritis. 100 g 0   ??? escitalopram oxalate (LEXAPRO) 5 MG tablet      ??? furosemide (LASIX) 20 MG tablet Take 2 tablets (40 mg total) by mouth daily. 60 tablet 6   ??? hydroCHLOROthiazide (HYDRODIURIL) 25 MG tablet TAKE 1 TABLET BY MOUTH EVERY DAY FOR BLOOD PRESSURE     ??? hydrOXYzine (ATARAX) 25 MG tablet Take 1 tablet (25 mg total) by mouth Three (3) times a day as needed. 90 tablet 1   ??? lactulose (CHRONULAC) 10 gram/15 mL solution Take 45 mL (30 g total) by mouth Two (2) times a day. 1200 mL 6   ??? lactulose (KRISTALOSE) 20 gram packet Take 1 packet (20 g total) by mouth 3 (three) times a day. 90 each 6   ??? LANTUS U-100 INSULIN 100 unit/mL injection INJECT 22 UNITS UNDER THE SKIN TWICE DAILY     ??? linaCLOtide (LINZESS) 290 mcg capsule Take 1 capsule (290 mcg total) by mouth daily. 30 capsule 6   ??? linagliptin (TRADJENTA) 5 mg Tab Take 5 mg by mouth daily.     ??? metFORMIN (GLUCOPHAGE) 500 MG tablet Take 1 tablet (500 mg total) by mouth 2 (  two) times a day with meals.     ??? metoprolol tartrate (LOPRESSOR) 100 MG tablet Take 0.5 tablets (50 mg total) by mouth Two (2) times a day. 30 tablet 0   ??? metoprolol tartrate (LOPRESSOR) 50 MG tablet TAKE ONE TABLET BY MOUTH TWO TIMES A DAY FOR BLOOD PRESSURE     ??? oxyCODONE (ROXICODONE) 5 MG immediate release tablet Take 1 tablet by mouth 4 (four) times a day as needed.     ??? pen needle, diabetic 32 gauge x 5/32 Ndle Use to give insulin 6 times a day.     ??? PNEUMOVAX 23 25 mcg/0.5 mL Syrg injection PHARMACIST ADMINISTERED IMMUNIZATION ADMINISTERED AT TIME OF DISPENSING  0   ??? pregabalin (LYRICA) 100 MG capsule Take 100 mg by mouth Two (2) times a day.     ??? QUEtiapine (SEROQUEL) 50 MG tablet Take 50 mg by mouth nightly.     ??? rifAXIMin (XIFAXAN) 550 mg Tab Take 1 tablet (550 mg total) by mouth Two (2) times a day. 60 tablet 6   ??? spironolactone (ALDACTONE) 50 MG tablet Take 1 tablet (50 mg total) by mouth daily. 60 tablet 3   ??? umeclidinium-vilanterol (ANORO ELLIPTA) 62.5-25 mcg/actuation inhaler Inhale 1 puff.        No current facility-administered medications for this visit.    See scanned MAR.    ALLERGIES:  Lisinopril and Codeine    FAMILY HISTORY:  No family history of liver disease.      SOCIAL HISTORY:  Tobacco use very occasional use per patient. Denies alcohol use. Previous IV drug use in the 1980s. Residing with her sister.     OBJECTIVE:   VITAL SIGNS and PHYSICAL EXAM:  Both VS and PE were not obtained today given nature of today's visit.     Laboratory results:  No additional laboratory studies ordered today.     ASSESSMENT AND PLAN:   1.  Decompensated cirrhosis secondary to HCV and alcohol abuse history (+ HE, +ascites, - variceal bleed).    Ms. Wingler is a very pleasant 64 yo AA female whose chief complaints today are continued cirrhosis care, HE, ascites and chronic constipation. History of three LVPs during lifetime. Onset of requiring LVPs over the past year. Hospitalized in August and negative evaluation for SBP. She continues to experience chronic constipation.Taking Kristalose bid and linzess 290 mcg daily. Use Miralax prn.     HE: Remains on Kristalose bid and  Xifaxan 550 mg bid. After discussion today with patient, feel HE is not as well controlled. Thought process forgetful and augmentive at certain times surrounding her comment involving forgetfulness and anxiety. + tremor per patient. Instructed to increase Kristalose to tid and remain on Xifaxan 550 mg bid. Goal of at least three bowel movements daily.     ~ Office follow up three months.     2. Nutritional support: Adhere to strict low sodium diet of less than 2,000 mg total daily and diet high in lean protein. Goal of 30 grams of lean protein with each meal and high protein snack at bedtime. Decrease carbohydrate intake.     2.  BLE/Ascites: Continue Lasix 40 mg and Aldactone 50 mg daily. Strict adherence with low sodium diet, less than 2 gm sodium diet.    3. Lymphadenopathy: s/p EUS with FNB. Benign path.     4. Diabetes mellitus ~Poorly controlled. Under care of PCP. Scheduled for follow up on 04/05/2019. Address concerns at that time.  5. HCC screening: Up to date. [place] surveillance every six months. Due for surveillance first of July 2021.     5.  History of HCV ~ Prior treatment-experienced with PEG/RVN at [facility] in 1997, genotype 1a. Successfully treated and cured s/p Harvoni x 24 weeks.     6.  Alcohol assessment: No alcohol use. AUDIT ~ zero.     7. Varices Screening-[place] surveillance 01/2020.     8. COPD: Under the care of [facility] pulmonary clinic. Congratulated on smoking cessation.     9. IPMN: Under care of biliary department at [facility].    10. Insomnia: Seroquel 50 mg nightly. Tolerating medication well.     11. Pruritis/anxiety: RX Atarax 25 mg one tablet tid prn. Encouraged to take tid and see if anxiety is better controlled.     12. Back pain with associated abdominal pain: Worse with movement. Suspect musculoskeletal in nature: Follow up with PCP. Recommend consideration of referral to ortho. She will address at f/u visit with PCP.     All patient's questions were answered to their satisfaction during visit.      I spent 26 minutes on the phone visit with the patient. I spent an additional 16 minutes on pre- and post-visit activities.     The patient was not located and I was not located within 250 yards of a [facility] based location during the phone visit. The patient was physically located in [place] or a state in which I am permitted to provide care. The patient and/or parent/guardian understood that s/he may incur co-pays and cost sharing, and agreed to the telemedicine visit. The visit was reasonable and appropriate under the circumstances given the patient's presentation at the time.    The patient and/or parent/guardian has been advised of the potential risks and limitations of this mode of treatment (including, but not limited to, the absence of in-person examination) and has agreed to be treated using telemedicine. The patient's/patient's family's questions regarding telemedicine have been answered.    If the visit was completed in an ambulatory setting, the patient and/or parent/guardian has also been advised to contact their provider???s office for worsening conditions, and seek emergency medical treatment and/or call 911 if the patient deems either necessary.    Sister ~ Santiago Glad [place] [facility] , Cell # [phone]).      Rodman Key, DNP, FNP-BC  [facility]  [address]  [place] [address]  Phone [phone]

## 2019-04-18 MED ORDER — ANORO ELLIPTA 62.5 MCG-25 MCG/ACTUATION POWDER FOR INHALATION
RESPIRATORY_TRACT | 0.00000 days
Start: 2019-04-18 — End: 2019-06-26

## 2019-04-20 NOTE — Unmapped (Signed)
[facility] Refill Coordination Note    Specialty Medication(s) to be Shipped:   Infectious Disease: Xifaxan    Other medication(s) to be shipped: N/A     Chloe [facility], DOB: [birth year]  Phone: [phone] (home)       All above HIPAA information was verified with patient.     Was a Nurse, learning disability used for this call? No    Completed refill call assessment today to schedule patient's medication shipment from the [facility] [phone]).       Specialty medication(s) and dose(s) confirmed: Regimen is correct and unchanged.   Changes to medications: Chloe Moore reports no changes at this time.  Changes to insurance: No  Questions for the pharmacist: No    Confirmed patient received Welcome Packet with first shipment. The patient will receive a drug information handout for each medication shipped and additional FDA Medication Guides as required.       DISEASE/MEDICATION-SPECIFIC INFORMATION        N/A    SPECIALTY MEDICATION ADHERENCE     Medication Adherence    Patient reported X missed doses in the last month: 0  Specialty Medication: xifaxan 550mg   Patient is on additional specialty medications: No                Xifaxan 550 mg: 7 days of medicine on hand         SHIPPING     Shipping address confirmed in Epic.     Delivery Scheduled: Yes, Expected medication delivery date: 04/25/19.     Medication will be delivered via Next Day Courier to the prescription address in Epic WAM.    Nancy Nordmann [facility] Specialty Technician

## 2019-04-24 MED FILL — XIFAXAN 550 MG TABLET: ORAL | 30 days supply | Qty: 60 | Fill #1

## 2019-04-24 MED FILL — XIFAXAN 550 MG TABLET: 30 days supply | Qty: 60 | Fill #1 | Status: AC

## 2019-04-27 DIAGNOSIS — L299 Pruritus, unspecified: Principal | ICD-10-CM

## 2019-04-28 MED ORDER — HYDROXYZINE HCL 25 MG TABLET
ORAL_TABLET | Freq: Three times a day (TID) | ORAL | 1 refills | 30 days | Status: CP | PRN
Start: 2019-04-28 — End: ?

## 2019-05-17 NOTE — Unmapped (Signed)
[facility] Refill Coordination Note    Specialty Medication(s) to be Shipped:   Infectious Disease: Xifaxan    Other medication(s) to be shipped: [employer], DOB: July 29, 1955  Phone: [phone] (home)       All above HIPAA information was verified with patient.     Was a Nurse, learning disability used for this call? No    Completed refill call assessment today to schedule patient's medication shipment from the [facility] [phone]).       Specialty medication(s) and dose(s) confirmed: Regimen is correct and unchanged.   Changes to medications: Belinda reports no changes at this time.  Changes to insurance: No  Questions for the pharmacist: No    Confirmed patient received Welcome Packet with first shipment. The patient will receive a drug information handout for each medication shipped and additional FDA Medication Guides as required.       DISEASE/MEDICATION-SPECIFIC INFORMATION        N/A    SPECIALTY MEDICATION ADHERENCE     Medication Adherence    Patient reported X missed doses in the last month: 0  Specialty Medication: xifaxan 550mg                 xifaxan 550mg   : 9 days of medicine on hand         SHIPPING     Shipping address confirmed in Epic.     Delivery Scheduled: Yes, Expected medication delivery date: 4/13.     Medication will be delivered via UPS to the prescription address in Epic WAM.    Chloe [facility]   [facility] Specialty Technician

## 2019-05-22 MED FILL — KRISTALOSE 20 GRAM ORAL PACKET: ORAL | 30 days supply | Qty: 90 | Fill #2

## 2019-05-22 MED FILL — KRISTALOSE 20 GRAM ORAL PACKET: 30 days supply | Qty: 90 | Fill #2 | Status: AC

## 2019-05-22 NOTE — Unmapped (Signed)
Delivery cancelled. Patient's Xifaxan was filled on 4/7 by [facility]. Phone number for the pharmacy is [phone]. Dellia Cloud R called but was unsuccessful in reaching patient. I will attempt again to reach patient.

## 2019-05-23 MED ORDER — ACCU-CHEK FASTCLIX LANCET DRUM
0.00000 days
Start: 2019-05-23 — End: ?

## 2019-05-26 NOTE — Unmapped (Signed)
Spoke with patient. She reports it is a mess [facility] to fill at [facility]. She requests we fill Xifaxan going forward. We agreed I would call at end of April to fill for early May. Patient has no questions for the pharmacist.

## 2019-05-30 MED ORDER — ACCU-CHEK FASTCLIX LANCING DEVICE KIT
0.00000 days
Start: 2019-05-30 — End: 2019-06-26

## 2019-05-30 MED ORDER — VARENICLINE 1 MG TABLET
ORAL | 0 days
Start: 2019-05-30 — End: ?

## 2019-06-02 MED ORDER — FUROSEMIDE 40 MG TABLET
ORAL | 0 days
Start: 2019-06-02 — End: 2019-06-26

## 2019-06-08 MED ORDER — ESCITALOPRAM 10 MG TABLET
ORAL | 0.00000 days
Start: 2019-06-08 — End: ?

## 2019-06-08 MED ORDER — QUETIAPINE 50 MG TABLET
ORAL | 0 days
Start: 2019-06-08 — End: 2019-06-26

## 2019-06-08 NOTE — Unmapped (Signed)
[facility] as [facility] continue to fill her X[facility]an. Patient will be staying [id number], at [address] at [address] of [address] in [place], [place] [address], phone: [phone]. I encouraged patient to call us if she has any questions, concerns, or would like to return to using [facility] pharmacy. Patient has no questions for the pharmacist.

## 2019-06-08 NOTE — Unmapped (Signed)
This patient has been disenrolled from the [facility] specialty pharmacy services due to a pharmacy change. The patient is now filling at [id number], at [address] at [employer] in [place], [place] [address], Phone: [phone].    Roderic Palau  [facility] Specialty Pharmacist

## 2019-06-13 DIAGNOSIS — L299 Pruritus, unspecified: Principal | ICD-10-CM

## 2019-06-13 MED ORDER — HYDROXYZINE HCL 25 MG TABLET
ORAL_TABLET | Freq: Three times a day (TID) | ORAL | 1 refills | 0 days | PRN
Start: 2019-06-13 — End: ?

## 2019-06-21 MED ORDER — POLYETHYLENE GLYCOL 3350 17 GRAM/DOSE ORAL POWDER
0.00000 days
Start: 2019-06-21 — End: ?

## 2019-06-21 MED ORDER — LACTULOSE 10 GRAM/15 ML ORAL SOLUTION
ORAL | 0 days
Start: 2019-06-21 — End: 2019-06-26

## 2019-06-21 MED ORDER — SPIRONOLACTONE 50 MG TABLET
ORAL | 0.00000 days
Start: 2019-06-21 — End: ?

## 2019-06-21 MED ORDER — FERROUS SULFATE 324 MG (65 MG IRON) TABLET,DELAYED RELEASE
0 days
Start: 2019-06-21 — End: ?

## 2019-06-21 MED ORDER — PREGABALIN 75 MG CAPSULE
ORAL | 0.00000 days
Start: 2019-06-21 — End: 2020-06-20

## 2019-06-21 MED ORDER — MELATONIN 3 MG TABLET
ORAL | 0.00000 days
Start: 2019-06-21 — End: ?

## 2019-06-22 MED ORDER — HYDROXYZINE HCL 25 MG TABLET
ORAL_TABLET | Freq: Three times a day (TID) | ORAL | 1 refills | 30.00000 days | Status: CP | PRN
Start: 2019-06-22 — End: ?

## 2019-06-23 MED ORDER — ACETAMINOPHEN 325 MG TABLET
0.00000 days
Start: 2019-06-23 — End: ?

## 2019-06-25 MED ORDER — METFORMIN 500 MG TABLET
0.00000 days
Start: 2019-06-25 — End: ?

## 2019-06-26 ENCOUNTER — Ambulatory Visit: Admit: 2019-06-26 | Discharge: 2019-06-27 | Payer: MEDICARE | Attending: Family | Primary: Family

## 2019-06-26 MED ORDER — DICLOFENAC 1 % TOPICAL GEL: 2 g | 0 refills | 0 days

## 2019-06-30 DIAGNOSIS — K7460 Unspecified cirrhosis of liver: Principal | ICD-10-CM

## 2019-06-30 DIAGNOSIS — K922 Gastrointestinal hemorrhage, unspecified: Principal | ICD-10-CM

## 2019-07-05 ENCOUNTER — Encounter: Admit: 2019-07-05 | Discharge: 2019-07-05 | Disposition: A | Discharge status: Home / Self Care | Payer: MEDICARE

## 2019-07-05 DIAGNOSIS — D649 Anemia, unspecified: Principal | ICD-10-CM

## 2019-07-14 DIAGNOSIS — G9340 Encephalopathy, unspecified: Principal | ICD-10-CM

## 2019-07-14 DIAGNOSIS — K7460 Unspecified cirrhosis of liver: Principal | ICD-10-CM

## 2019-07-14 MED ORDER — LACTULOSE 20 GRAM ORAL PACKET
Freq: Three times a day (TID) | ORAL | 0 refills | 5 days | Status: CP
Start: 2019-07-14 — End: ?

## 2019-08-03 DIAGNOSIS — L299 Pruritus, unspecified: Principal | ICD-10-CM

## 2019-08-03 MED ORDER — HYDROXYZINE HCL 25 MG TABLET
ORAL_TABLET | Freq: Three times a day (TID) | ORAL | 1 refills | 30 days | Status: CP | PRN
Start: 2019-08-03 — End: ?

## 2019-08-10 DIAGNOSIS — K7460 Unspecified cirrhosis of liver: Principal | ICD-10-CM

## 2019-08-10 DIAGNOSIS — G9340 Encephalopathy, unspecified: Principal | ICD-10-CM

## 2019-08-11 MED ORDER — LACTULOSE 10 GRAM/15 ML ORAL SOLUTION
0 refills | 0 days | Status: CP
Start: 2019-08-11 — End: ?

## 2019-09-12 MED ORDER — FUROSEMIDE 40 MG TABLET
Freq: Every day | ORAL | 0.00000 days
Start: 2019-09-12 — End: ?

## 2019-10-27 MED ORDER — INSULIN LISPRO (U-100) 100 UNIT/ML SUBCUTANEOUS PEN
0.00000 days
Start: 2019-10-27 — End: ?

## 2020-01-01 DIAGNOSIS — B182 Chronic viral hepatitis C: Principal | ICD-10-CM

## 2020-01-29 DIAGNOSIS — K7469 Other cirrhosis of liver: Principal | ICD-10-CM

## 2020-03-06 ENCOUNTER — Encounter: Admit: 2020-03-06 | Discharge: 2020-03-07 | Payer: MEDICARE | Attending: Family | Primary: Family

## 2020-03-06 DIAGNOSIS — B182 Chronic viral hepatitis C: Principal | ICD-10-CM

## 2020-03-06 DIAGNOSIS — R251 Tremor, unspecified: Principal | ICD-10-CM

## 2020-03-06 DIAGNOSIS — Z1321 Encounter for screening for nutritional disorder: Principal | ICD-10-CM

## 2020-03-06 DIAGNOSIS — D490 Neoplasm of unspecified behavior of digestive system: Principal | ICD-10-CM

## 2020-03-06 DIAGNOSIS — K7460 Unspecified cirrhosis of liver: Principal | ICD-10-CM

## 2020-03-06 DIAGNOSIS — D500 Iron deficiency anemia secondary to blood loss (chronic): Principal | ICD-10-CM

## 2020-03-06 DIAGNOSIS — R591 Generalized enlarged lymph nodes: Principal | ICD-10-CM

## 2020-03-26 DIAGNOSIS — E559 Vitamin D deficiency, unspecified: Principal | ICD-10-CM

## 2020-03-26 DIAGNOSIS — E509 Vitamin A deficiency, unspecified: Principal | ICD-10-CM

## 2020-03-26 MED ORDER — CHOLECALCIFEROL (VITAMIN D3) 125 MCG (5,000 UNIT) TABLET
ORAL_TABLET | Freq: Every day | ORAL | 11 refills | 30 days | Status: CP
Start: 2020-03-26 — End: ?

## 2020-03-26 MED ORDER — VITAMIN A CAPSULE 3,000 MCG RAE (10000 UNITS)
Freq: Every day | ORAL | 0 days
Start: 2020-03-26 — End: ?

## 2020-03-26 MED ORDER — VITAMIN A 2,400 MCG RAE (8,000 UNIT) CAPSULE
ORAL_CAPSULE | Freq: Every day | ORAL | 11 refills | 30 days | Status: CP
Start: 2020-03-26 — End: ?

## 2020-03-28 ENCOUNTER — Encounter: Admit: 2020-03-28 | Discharge: 2020-03-29 | Payer: MEDICARE

## 2020-04-11 DIAGNOSIS — K769 Liver disease, unspecified: Principal | ICD-10-CM

## 2020-04-11 DIAGNOSIS — R188 Other ascites: Principal | ICD-10-CM

## 2020-04-11 DIAGNOSIS — K7460 Unspecified cirrhosis of liver: Principal | ICD-10-CM

## 2020-04-12 DIAGNOSIS — K769 Liver disease, unspecified: Principal | ICD-10-CM

## 2020-04-12 DIAGNOSIS — K7460 Unspecified cirrhosis of liver: Principal | ICD-10-CM

## 2020-04-19 ENCOUNTER — Encounter: Admit: 2020-04-19 | Discharge: 2020-04-20 | Payer: MEDICARE

## 2020-04-19 DIAGNOSIS — K769 Liver disease, unspecified: Principal | ICD-10-CM

## 2020-04-19 DIAGNOSIS — K7460 Unspecified cirrhosis of liver: Principal | ICD-10-CM

## 2020-05-06 MED ORDER — TRINTELLIX 10 MG TABLET
0 days
Start: 2020-05-06 — End: ?

## 2020-05-07 MED ORDER — VARENICLINE 0.5 MG TABLET
0.00000 days
Start: 2020-05-07 — End: ?

## 2020-05-08 MED ORDER — HYDROXYZINE HCL 50 MG TABLET
0 days
Start: 2020-05-08 — End: ?

## 2020-05-20 MED ORDER — GLIMEPIRIDE 2 MG TABLET
0.00000 days
Start: 2020-05-20 — End: ?

## 2020-05-23 ENCOUNTER — Ambulatory Visit
Admit: 2020-05-23 | Discharge: 2020-05-24 | Payer: MEDICARE | Attending: Student in an Organized Health Care Education/Training Program | Primary: Student in an Organized Health Care Education/Training Program

## 2020-05-23 MED ORDER — GABAPENTIN 100 MG CAPSULE
ORAL_CAPSULE | Freq: Three times a day (TID) | ORAL | 3 refills | 90.00000 days | Status: CP
Start: 2020-05-23 — End: 2020-05-23

## 2020-05-23 MED ORDER — PREGABALIN 50 MG CAPSULE
ORAL_CAPSULE | Freq: Two times a day (BID) | ORAL | 11 refills | 30 days | Status: CP
Start: 2020-05-23 — End: 2021-05-23

## 2020-07-26 DIAGNOSIS — N939 Abnormal uterine and vaginal bleeding, unspecified: Principal | ICD-10-CM

## 2020-08-14 DIAGNOSIS — K7460 Unspecified cirrhosis of liver: Principal | ICD-10-CM

## 2020-08-14 DIAGNOSIS — K769 Liver disease, unspecified: Principal | ICD-10-CM

## 2020-09-04 ENCOUNTER — Encounter
Admit: 2020-09-04 | Discharge: 2020-09-05 | Disposition: A | Discharge status: Home / Self Care | Payer: MEDICARE | Attending: Emergency Medicine

## 2020-09-05 MED ORDER — CEPHALEXIN 500 MG CAPSULE
ORAL_CAPSULE | Freq: Two times a day (BID) | ORAL | 0 refills | 10 days | Status: CP
Start: 2020-09-05 — End: 2020-09-15

## 2020-09-11 ENCOUNTER — Encounter
Admit: 2020-09-11 | Discharge: 2020-09-12 | Payer: MEDICARE | Attending: Student in an Organized Health Care Education/Training Program | Primary: Student in an Organized Health Care Education/Training Program

## 2020-09-11 DIAGNOSIS — R413 Other amnesia: Principal | ICD-10-CM

## 2020-09-11 MED ORDER — PREGABALIN 50 MG CAPSULE
ORAL_CAPSULE | ORAL | 0 refills | 37.00000 days | Status: CP
Start: 2020-09-11 — End: 2020-10-18

## 2020-10-21 MED ORDER — PREGABALIN 50 MG CAPSULE
ORAL_CAPSULE | ORAL | 0 refills | 37 days
Start: 2020-10-21 — End: 2020-11-27

## 2020-10-22 MED ORDER — PREGABALIN 150 MG CAPSULE
ORAL_CAPSULE | Freq: Three times a day (TID) | ORAL | 3 refills | 90.00000 days | Status: CP
Start: 2020-10-22 — End: 2021-10-22

## 2020-11-25 DIAGNOSIS — E1169 Type 2 diabetes mellitus with other specified complication: Principal | ICD-10-CM

## 2021-04-03 ENCOUNTER — Ambulatory Visit: Admit: 2021-04-03 | Discharge: 2021-04-04 | Payer: MEDICARE | Attending: Women's Health | Primary: Women's Health

## 2021-04-03 DIAGNOSIS — Z8619 Personal history of other infectious and parasitic diseases: Principal | ICD-10-CM

## 2021-04-03 DIAGNOSIS — N939 Abnormal uterine and vaginal bleeding, unspecified: Principal | ICD-10-CM

## 2021-04-03 DIAGNOSIS — E11649 Type 2 diabetes mellitus with hypoglycemia without coma: Principal | ICD-10-CM

## 2021-04-03 DIAGNOSIS — N898 Other specified noninflammatory disorders of vagina: Principal | ICD-10-CM

## 2021-05-09 ENCOUNTER — Ambulatory Visit
Admit: 2021-05-09 | Discharge: 2021-05-10 | Payer: MEDICARE | Attending: Student in an Organized Health Care Education/Training Program | Primary: Student in an Organized Health Care Education/Training Program

## 2021-05-09 DIAGNOSIS — K7031 Alcoholic cirrhosis of liver with ascites: Principal | ICD-10-CM

## 2021-05-09 DIAGNOSIS — R413 Other amnesia: Principal | ICD-10-CM

## 2021-07-17 ENCOUNTER — Ambulatory Visit: Admit: 2021-07-17 | Discharge: 2021-07-18 | Payer: MEDICARE

## 2021-07-17 DIAGNOSIS — K7460 Unspecified cirrhosis of liver: Principal | ICD-10-CM

## 2021-07-17 DIAGNOSIS — K769 Liver disease, unspecified: Principal | ICD-10-CM

## 2021-07-31 ENCOUNTER — Ambulatory Visit: Admit: 2021-07-31 | Discharge: 2021-08-01 | Payer: MEDICARE

## 2021-07-31 DIAGNOSIS — K7460 Unspecified cirrhosis of liver: Principal | ICD-10-CM

## 2021-07-31 DIAGNOSIS — K769 Liver disease, unspecified: Principal | ICD-10-CM
